# Patient Record
Sex: Male | Born: 1955
Health system: Southern US, Community
[De-identification: ages and names within clinical notes are randomized; demographics above are authoritative.]

## PROBLEM LIST (undated history)

## (undated) DIAGNOSIS — N183 Chronic kidney disease, stage 3 (moderate): Secondary | ICD-10-CM

## (undated) DIAGNOSIS — K219 Gastro-esophageal reflux disease without esophagitis: Secondary | ICD-10-CM

## (undated) DIAGNOSIS — I2510 Atherosclerotic heart disease of native coronary artery without angina pectoris: Secondary | ICD-10-CM

## (undated) DIAGNOSIS — E785 Hyperlipidemia, unspecified: Secondary | ICD-10-CM

## (undated) DIAGNOSIS — Z87442 Personal history of urinary calculi: Secondary | ICD-10-CM

## (undated) DIAGNOSIS — N200 Calculus of kidney: Secondary | ICD-10-CM

## (undated) DIAGNOSIS — E669 Obesity, unspecified: Secondary | ICD-10-CM

## (undated) HISTORY — PX: OTHER SURGICAL HISTORY: SHX169

## (undated) HISTORY — PX: POLYPECTOMY: SHX149

## (undated) HISTORY — DX: Atherosclerotic heart disease of native coronary artery without angina pectoris: I25.10

## (undated) HISTORY — DX: Gastro-esophageal reflux disease without esophagitis: K21.9

## (undated) HISTORY — PX: LITHOTRIPSY: SUR834

## (undated) HISTORY — DX: Calculus of kidney: N20.0

---

## 2000-10-17 ENCOUNTER — Encounter: Payer: Self-pay | Admitting: Family Medicine

## 2000-10-17 ENCOUNTER — Ambulatory Visit (HOSPITAL_COMMUNITY): Admission: RE | Admit: 2000-10-17 | Discharge: 2000-10-17 | Payer: Self-pay

## 2005-12-15 HISTORY — PX: COLONOSCOPY: SHX174

## 2006-01-20 ENCOUNTER — Ambulatory Visit: Payer: Self-pay | Admitting: Gastroenterology

## 2006-02-03 ENCOUNTER — Ambulatory Visit: Payer: Self-pay | Admitting: Gastroenterology

## 2011-03-07 ENCOUNTER — Encounter: Payer: Self-pay | Admitting: Gastroenterology

## 2011-03-13 NOTE — Letter (Signed)
Summary: Colonoscopy Letter  [facility]  [address] [place], [place] [address]   Phone: [phone]  Fax: [phone]      March 07, 2011 MRN: [id number]   [facility] [place] [address] [place], [place]  [address]   Dear Mr. Dunsmore,   According to your medical record, it is time for you to schedule a Colonoscopy. The American Cancer Society recommends this procedure as a method to detect early colon cancer. Patients with a family history of colon cancer, or a personal history of colon polyps or inflammatory bowel disease are at increased risk.  This letter has been generated based on the recommendations made at the time of your procedure. If you feel that in your particular situation this may no longer apply, please contact our office.  Please call our office at [phone] to schedule this appointment or to update your records at your earliest convenience.  Thank you for cooperating with [place] to provide you with the very best care possible.   Sincerely,  Barbette Hair. Arlyce Dice, M.D.  [facility] [phone]

## 2011-06-02 ENCOUNTER — Encounter: Payer: Self-pay | Admitting: Gastroenterology

## 2011-06-02 ENCOUNTER — Ambulatory Visit (AMBULATORY_SURGERY_CENTER): Payer: BC Managed Care – PPO | Admitting: *Deleted

## 2011-06-02 VITALS — Ht 68.0 in | Wt 200.1 lb

## 2011-06-02 DIAGNOSIS — Z86010 Personal history of colonic polyps: Secondary | ICD-10-CM

## 2011-06-02 MED ORDER — PEG-KCL-NACL-NASULF-NA ASC-C 100 G PO SOLR: ORAL | Status: DC

## 2011-06-16 ENCOUNTER — Ambulatory Visit (AMBULATORY_SURGERY_CENTER): Payer: BC Managed Care – PPO | Admitting: Gastroenterology

## 2011-06-16 ENCOUNTER — Encounter: Payer: Self-pay | Admitting: Gastroenterology

## 2011-06-16 VITALS — BP 145/85 | HR 57 | Temp 97.4°F | Resp 11 | Ht 68.0 in | Wt 200.0 lb

## 2011-06-16 DIAGNOSIS — Z86010 Personal history of colonic polyps: Secondary | ICD-10-CM

## 2011-06-16 DIAGNOSIS — D126 Benign neoplasm of colon, unspecified: Secondary | ICD-10-CM

## 2011-06-16 DIAGNOSIS — Z1211 Encounter for screening for malignant neoplasm of colon: Secondary | ICD-10-CM

## 2011-06-16 DIAGNOSIS — K5730 Diverticulosis of large intestine without perforation or abscess without bleeding: Secondary | ICD-10-CM

## 2011-06-16 MED ORDER — SODIUM CHLORIDE 0.9 % IV SOLN: 500.0000 mL | INTRAVENOUS | Status: DC

## 2011-06-16 NOTE — Progress Notes (Signed)
Abdominal pressure by tech and nurse to aid the scope advancement to reach the cecum.  MAW  Pt tolerated the procedure well. MAW

## 2011-06-16 NOTE — Patient Instructions (Addendum)
Diverticulosis Diverticulosis is a common condition that develops when small pouches (diverticula) form in the wall of the colon. The risk of diverticulosis increases with age. It happens more often in people who eat a low-fiber diet. Most individuals with diverticulosis have no symptoms. Those individuals with symptoms usually experience belly (abdominal) pain, constipation, or loose stools (diarrhea). HOME CARE INSTRUCTIONS  Increase the amount of fiber in your diet as directed by your caregiver or dietician. This may reduce symptoms of diverticulosis.   Your caregiver may recommend taking a dietary fiber supplement.   Drink at least 6 to 8 glasses of water each day to prevent constipation.   Try not to strain when you have a bowel movement.   Your caregiver may recommend avoiding nuts and seeds to prevent complications, although this is still an uncertain benefit.   Only take over-the-counter or prescription medicines for pain, discomfort, or fever as directed by your caregiver.  FOODS HAVING HIGH FIBER CONTENT INCLUDE:  Fruits. Apple, peach, pear, tangerine, raisins, prunes.   Vegetables. Brussels sprouts, asparagus, broccoli, cabbage, carrot, cauliflower, romaine lettuce, spinach, summer squash, tomato, winter squash, zucchini.   Starchy Vegetables. Baked beans, kidney beans, lima beans, split peas, lentils, potatoes (with skin).   Grains. Whole wheat bread, brown rice, bran flake cereal, plain oatmeal, white rice, shredded wheat, bran muffins.  SEEK IMMEDIATE MEDICAL CARE IF:  You develop increasing pain or severe bloating.   You have an oral temperature above 100, not controlled by medicine.   You develop vomiting or bowel movements that are bloody or black.  Document Released: 08/28/2004 Document Re-Released: 05/21/2010 [facility] Patient Information 2011 [place], [place]  .Polyps, Colon  A polyp is extra tissue that grows inside your body. Colon polyps grow in the large  intestine. The large intestine, also called the colon, is part of your digestive system. It is a long, hollow tube at the end of your digestive tract where your body makes and stores stool. Most polyps are not dangerous. They are benign. This means they are not cancerous. But over time, some types of polyps can turn into cancer. Polyps that are smaller than a pea are usually not harmful. But larger polyps could someday become or may already be cancerous. To be safe, doctors remove all polyps and test them.  WHO GETS POLYPS? Anyone can get polyps, but certain people are more likely than others. You may have a greater chance of getting polyps if:  You are over 50.   You have had polyps before.   Someone in your family has had polyps.   Someone in your family has had cancer of the large intestine.   Find out if someone in your family has had polyps. You may also be more likely to get polyps if you:   Eat a lot of fatty foods   Smoke   Drink alcohol   Do not exercise  Eat too much  SYMPTOMS Most small polyps do not cause symptoms. People often do not know they have one until their caregiver finds it during a regular checkup or while testing them for something else. Some people do have symptoms like these:  Bleeding from the anus. You might notice blood on your underwear or on toilet paper after you have had a bowel movement.   Constipation or diarrhea that lasts more than a week.   Blood in the stool. Blood can make stool look black or it can show up as red streaks in the stool.  If  you have any of these symptoms, see your caregiver. HOW DOES THE DOCTOR TEST FOR POLYPS? The doctor can use four tests to check for polyps:  Digital rectal exam. The caregiver wears gloves and checks your rectum (the last part of the large intestine) to see if it feels normal. This test would find polyps only in the rectum. Your caregiver may need to do one of the other tests listed below to find polyps  higher up in the intestine.   Barium enema. The caregiver puts a liquid called barium into your rectum before taking x-rays of your large intestine. Barium makes your intestine look white in the pictures. Polyps are dark, so they are easy to see.   Sigmoidoscopy. With this test, the caregiver can see inside your large intestine. A thin flexible tube is placed into your rectum. The device is called a sigmoidoscope, which has a light and a tiny video camera in it. The caregiver uses the sigmoidoscope to look at the last third of your large intestine.   Colonoscopy. This test is like sigmoidoscopy, but the caregiver looks at all of the large intestine. It usually requires sedation. This is the most common method for finding and removing polyps.  TREATMENT  The caregiver will remove the polyp during sigmoidoscopy or colonoscopy. The polyp is then tested for cancer.   If you have had polyps, your caregiver may want you to get tested regularly in the future.  PREVENTION There is not one sure way to prevent polyps. You might be able to lower your risk of getting them if you:  Eat more fruits and vegetables and less fatty food.   Do not smoke.   Avoid alcohol.   Exercise every day.   Lose weight if you are overweight.   Eating more calcium and folate can also lower your risk of getting polyps. Some foods that are rich in calcium are milk, cheese, and broccoli. Some foods that are rich in folate are chickpeas, kidney beans, and spinach.   Aspirin might help prevent polyps. Studies are under way.  Document Released: 08/27/2004 Document Re-Released: 05/21/2010 [facility] Patient Information 2011 [place], [place].  [employer] reviewed with patient and care partner.  [facility] Discharge Instructions signed by care partner.  Impressions:  Polyp (handout given)  Diverticulosis (handout given along with high fiber diet handout)  Resume medications as you were previously  taking prior to exam.

## 2011-06-17 ENCOUNTER — Telehealth: Payer: Self-pay

## 2011-06-17 NOTE — Telephone Encounter (Signed)

## 2014-01-25 ENCOUNTER — Ambulatory Visit: Payer: BC Managed Care – PPO | Admitting: Cardiology

## 2015-01-01 ENCOUNTER — Other Ambulatory Visit: Payer: Self-pay | Admitting: Family Medicine

## 2015-01-01 ENCOUNTER — Other Ambulatory Visit: Payer: Self-pay

## 2015-01-01 DIAGNOSIS — M5412 Radiculopathy, cervical region: Secondary | ICD-10-CM

## 2015-01-02 ENCOUNTER — Ambulatory Visit
Admission: RE | Admit: 2015-01-02 | Discharge: 2015-01-02 | Disposition: A | Discharge status: Home / Self Care | Payer: BLUE CROSS/BLUE SHIELD | Source: Ambulatory Visit | Attending: Family Medicine | Admitting: Family Medicine

## 2015-01-02 DIAGNOSIS — M5412 Radiculopathy, cervical region: Secondary | ICD-10-CM

## 2015-11-22 ENCOUNTER — Encounter: Payer: Self-pay | Admitting: Gastroenterology

## 2016-04-29 DIAGNOSIS — K429 Umbilical hernia without obstruction or gangrene: Secondary | ICD-10-CM | POA: Diagnosis not present

## 2016-04-29 DIAGNOSIS — R944 Abnormal results of kidney function studies: Secondary | ICD-10-CM | POA: Diagnosis not present

## 2016-05-05 DIAGNOSIS — K429 Umbilical hernia without obstruction or gangrene: Secondary | ICD-10-CM | POA: Diagnosis not present

## 2016-05-19 ENCOUNTER — Encounter: Payer: Self-pay | Admitting: Gastroenterology

## 2016-06-19 DIAGNOSIS — M9901 Segmental and somatic dysfunction of cervical region: Secondary | ICD-10-CM | POA: Diagnosis not present

## 2016-06-19 DIAGNOSIS — R51 Headache: Secondary | ICD-10-CM | POA: Diagnosis not present

## 2016-06-23 DIAGNOSIS — R51 Headache: Secondary | ICD-10-CM | POA: Diagnosis not present

## 2016-06-23 DIAGNOSIS — M9901 Segmental and somatic dysfunction of cervical region: Secondary | ICD-10-CM | POA: Diagnosis not present

## 2016-06-24 DIAGNOSIS — R51 Headache: Secondary | ICD-10-CM | POA: Diagnosis not present

## 2016-06-24 DIAGNOSIS — M9901 Segmental and somatic dysfunction of cervical region: Secondary | ICD-10-CM | POA: Diagnosis not present

## 2016-06-26 DIAGNOSIS — M9901 Segmental and somatic dysfunction of cervical region: Secondary | ICD-10-CM | POA: Diagnosis not present

## 2016-06-26 DIAGNOSIS — R51 Headache: Secondary | ICD-10-CM | POA: Diagnosis not present

## 2016-06-30 DIAGNOSIS — R51 Headache: Secondary | ICD-10-CM | POA: Diagnosis not present

## 2016-06-30 DIAGNOSIS — M9901 Segmental and somatic dysfunction of cervical region: Secondary | ICD-10-CM | POA: Diagnosis not present

## 2016-07-02 DIAGNOSIS — M9901 Segmental and somatic dysfunction of cervical region: Secondary | ICD-10-CM | POA: Diagnosis not present

## 2016-07-02 DIAGNOSIS — R51 Headache: Secondary | ICD-10-CM | POA: Diagnosis not present

## 2016-07-03 DIAGNOSIS — R51 Headache: Secondary | ICD-10-CM | POA: Diagnosis not present

## 2016-07-03 DIAGNOSIS — M9901 Segmental and somatic dysfunction of cervical region: Secondary | ICD-10-CM | POA: Diagnosis not present

## 2016-07-07 DIAGNOSIS — M9901 Segmental and somatic dysfunction of cervical region: Secondary | ICD-10-CM | POA: Diagnosis not present

## 2016-07-07 DIAGNOSIS — R51 Headache: Secondary | ICD-10-CM | POA: Diagnosis not present

## 2016-07-09 DIAGNOSIS — M9901 Segmental and somatic dysfunction of cervical region: Secondary | ICD-10-CM | POA: Diagnosis not present

## 2016-07-09 DIAGNOSIS — R51 Headache: Secondary | ICD-10-CM | POA: Diagnosis not present

## 2016-07-16 DIAGNOSIS — R51 Headache: Secondary | ICD-10-CM | POA: Diagnosis not present

## 2016-07-16 DIAGNOSIS — M9901 Segmental and somatic dysfunction of cervical region: Secondary | ICD-10-CM | POA: Diagnosis not present

## 2016-07-21 DIAGNOSIS — M9901 Segmental and somatic dysfunction of cervical region: Secondary | ICD-10-CM | POA: Diagnosis not present

## 2016-07-21 DIAGNOSIS — R51 Headache: Secondary | ICD-10-CM | POA: Diagnosis not present

## 2016-07-28 DIAGNOSIS — M9901 Segmental and somatic dysfunction of cervical region: Secondary | ICD-10-CM | POA: Diagnosis not present

## 2016-07-28 DIAGNOSIS — R51 Headache: Secondary | ICD-10-CM | POA: Diagnosis not present

## 2016-08-13 ENCOUNTER — Encounter: Payer: Self-pay | Admitting: Gastroenterology

## 2016-08-19 DIAGNOSIS — K080 Exfoliation of teeth due to systemic causes: Secondary | ICD-10-CM | POA: Diagnosis not present

## 2016-10-22 ENCOUNTER — Encounter: Payer: BLUE CROSS/BLUE SHIELD | Admitting: Gastroenterology

## 2016-11-20 DIAGNOSIS — L814 Other melanin hyperpigmentation: Secondary | ICD-10-CM | POA: Diagnosis not present

## 2016-11-20 DIAGNOSIS — D1800 Hemangioma unspecified site: Secondary | ICD-10-CM | POA: Diagnosis not present

## 2016-11-20 DIAGNOSIS — D225 Melanocytic nevi of trunk: Secondary | ICD-10-CM | POA: Diagnosis not present

## 2016-11-20 DIAGNOSIS — L821 Other seborrheic keratosis: Secondary | ICD-10-CM | POA: Diagnosis not present

## 2016-12-02 ENCOUNTER — Ambulatory Visit
Admission: RE | Admit: 2016-12-02 | Discharge: 2016-12-02 | Disposition: A | Discharge status: Home / Self Care | Payer: BLUE CROSS/BLUE SHIELD | Source: Ambulatory Visit | Attending: Physician Assistant | Admitting: Physician Assistant

## 2016-12-02 ENCOUNTER — Other Ambulatory Visit: Payer: Self-pay | Admitting: Physician Assistant

## 2016-12-02 DIAGNOSIS — N200 Calculus of kidney: Secondary | ICD-10-CM | POA: Diagnosis not present

## 2016-12-02 DIAGNOSIS — M545 Low back pain: Secondary | ICD-10-CM | POA: Diagnosis not present

## 2016-12-23 ENCOUNTER — Ambulatory Visit (AMBULATORY_SURGERY_CENTER): Payer: Self-pay | Admitting: *Deleted

## 2016-12-23 VITALS — Ht 68.0 in | Wt 202.4 lb

## 2016-12-23 DIAGNOSIS — Z860100 Personal history of colon polyps, unspecified: Secondary | ICD-10-CM

## 2016-12-23 MED ORDER — NA SULFATE-K SULFATE-MG SULF 17.5-3.13-1.6 GM/177ML PO SOLN: ORAL | 0 refills | Status: DC

## 2016-12-23 NOTE — Progress Notes (Signed)
No egg or soy allergy  Pt denies trouble moving neck or anesthesia problems  No home oxygen used or hx of sleep apnea  When I asked him about his family hx, he states, "Don't ask me about my family.  I don't know and I'm not close to them."  No diet medications taken

## 2016-12-29 ENCOUNTER — Encounter: Payer: Self-pay | Admitting: Gastroenterology

## 2016-12-29 ENCOUNTER — Telehealth: Payer: Self-pay | Admitting: Gastroenterology

## 2016-12-29 NOTE — Telephone Encounter (Signed)
Spoke with wife.  Reviewed instructions for Miralax prep.  She stated understanding and mentioned she is a neighbor to [employer] and plans to call her if she has more questions.  Advised her that Olivia Mackie is not here today.                                                                                                                                                                           Amylia Collazos/RN

## 2016-12-30 DIAGNOSIS — R300 Dysuria: Secondary | ICD-10-CM | POA: Diagnosis not present

## 2016-12-30 DIAGNOSIS — N410 Acute prostatitis: Secondary | ICD-10-CM | POA: Diagnosis not present

## 2017-01-06 ENCOUNTER — Encounter: Payer: BLUE CROSS/BLUE SHIELD | Admitting: Gastroenterology

## 2017-02-12 ENCOUNTER — Ambulatory Visit (AMBULATORY_SURGERY_CENTER): Payer: BLUE CROSS/BLUE SHIELD | Admitting: Gastroenterology

## 2017-02-12 ENCOUNTER — Encounter: Payer: Self-pay | Admitting: Gastroenterology

## 2017-02-12 VITALS — BP 120/75 | HR 52 | Temp 98.7°F | Resp 15 | Ht 68.0 in | Wt 202.0 lb

## 2017-02-12 DIAGNOSIS — D122 Benign neoplasm of ascending colon: Secondary | ICD-10-CM

## 2017-02-12 DIAGNOSIS — Z86010 Personal history of colonic polyps: Secondary | ICD-10-CM

## 2017-02-12 MED ORDER — SODIUM CHLORIDE 0.9 % IV SOLN: 500.0000 mL | INTRAVENOUS | Status: DC

## 2017-02-12 NOTE — Op Note (Signed)
[place] Patient Name: Manvir Camp Procedure Date: 02/12/2017 1:26 PM MRN: [id number] Endoscopist: Mallie Mussel L. Loletha Carrow , MD Age: 61 Referring MD:  Date of Birth: 1956/10/02 Gender: Male Account #: [id number] Procedure:                Colonoscopy Indications:              Surveillance: Personal history of adenomatous                            polyps on last colonoscopy 5 years ago (63mm TA 2012) Medicines:                Monitored Anesthesia Care Procedure:                Pre-Anesthesia Assessment:                           - Prior to the procedure, a History and Physical                            was performed, and patient medications and                            allergies were reviewed. The patient's tolerance of                            previous anesthesia was also reviewed. The risks                            and benefits of the procedure and the sedation                            options and risks were discussed with the patient.                            All questions were answered, and informed consent                            was obtained. Prior Anticoagulants: The patient has                            taken no previous anticoagulant or antiplatelet                            agents. ASA Grade Assessment: II - A patient with                            mild systemic disease. After reviewing the risks                            and benefits, the patient was deemed in                            satisfactory condition to undergo the procedure.  After obtaining informed consent, the colonoscope                            was passed under direct vision. Throughout the                            procedure, the patient's blood pressure, pulse, and                            oxygen saturations were monitored continuously. The                            Colonoscope was introduced through the anus and                            advanced to the  the cecum, identified by                            appendiceal orifice and ileocecal valve. The                            colonoscopy was performed without difficulty. The                            patient tolerated the procedure well. The quality                            of the bowel preparation was good. The ileocecal                            valve, appendiceal orifice, and rectum were                            photographed. The quality of the bowel preparation                            was evaluated using the BBPS [facility] Bowel                            Preparation Scale) with scores of: Right Colon = 2,                            Transverse Colon = 2 and Left Colon = 2. The total                            BBPS score equals 6. ,after lavage. The bowel                            preparation used was SUPREP. Scope In: 1:38:59 PM Scope Out: 1:54:50 PM Scope Withdrawal Time: 0 hours 10 minutes 50 seconds  Total Procedure Duration: 0 hours 15 minutes 51 seconds  Findings:                 The perianal and  digital rectal examinations were                            normal.                           A 8 mm polyp was found in the proximal ascending                            colon. The polyp was semi-sessile. The polyp was                            removed with a cold snare. Resection and retrieval                            were complete.                           A few small-mouthed diverticula were found in the                            left colon.                           The exam was otherwise without abnormality on                            direct and retroflexion views. Complications:            No immediate complications. Estimated Blood Loss:     Estimated blood loss: none. Impression:               - One 8 mm polyp in the proximal ascending colon,                            removed with a cold snare. Resected and retrieved.                           - Diverticulosis  in the left colon.                           - The examination was otherwise normal on direct                            and retroflexion views. Recommendation:           - Patient has a contact number available for                            emergencies. The signs and symptoms of potential                            delayed complications were discussed with the                            patient. Return to normal activities tomorrow.  Written discharge instructions were provided to the                            patient.                           - Resume previous diet.                           - Continue present medications.                           - Await pathology results.                           - Repeat colonoscopy is recommended for                            surveillance. The colonoscopy date will be                            determined after pathology results from today's                            exam become available for review. Henry L. Loletha Carrow, MD 02/12/2017 2:02:41 PM This report has been signed electronically.

## 2017-02-12 NOTE — Progress Notes (Signed)
Called to room to assist during endoscopic procedure.  Patient ID and intended procedure confirmed with present staff. Received instructions for my participation in the procedure from the performing physician.  

## 2017-02-12 NOTE — Progress Notes (Signed)
Report to PACU, RN, vss, BBS= Clear.  

## 2017-02-12 NOTE — Patient Instructions (Signed)
YOU HAD AN ENDOSCOPIC PROCEDURE TODAY AT THE [facility]:   Refer to the procedure report that was given to you for any specific questions about what was found during the examination.  If the procedure report does not answer your questions, please call your gastroenterologist to clarify.  If you requested that your care partner not be given the details of your procedure findings, then the procedure report has been included in a sealed envelope for you to review at your convenience later.  YOU SHOULD EXPECT: Some feelings of bloating in the abdomen. Passage of more gas than usual.  Walking can help get rid of the air that was put into your GI tract during the procedure and reduce the bloating. If you had a lower endoscopy (such as a colonoscopy or flexible sigmoidoscopy) you may notice spotting of blood in your stool or on the toilet paper. If you underwent a bowel prep for your procedure, you may not have a normal bowel movement for a few days.  Please Note:  You might notice some irritation and congestion in your nose or some drainage.  This is from the oxygen used during your procedure.  There is no need for concern and it should clear up in a day or so.  SYMPTOMS TO REPORT IMMEDIATELY:   Following lower endoscopy (colonoscopy or flexible sigmoidoscopy):  Excessive amounts of blood in the stool  Significant tenderness or worsening of abdominal pains  Swelling of the abdomen that is new, acute  Fever of 100F or higher    For urgent or emergent issues, a gastroenterologist can be reached at any hour by calling [phone].   DIET:  We do recommend a small meal at first, but then you may proceed to your regular diet.  Drink plenty of fluids but you should avoid alcoholic beverages for 24 hours.  ACTIVITY:  You should plan to take it easy for the rest of today and you should NOT DRIVE or use heavy machinery until tomorrow (because of the sedation medicines used during the test).     FOLLOW UP: Our staff will call the number listed on your records the next business day following your procedure to check on you and address any questions or concerns that you may have regarding the information given to you following your procedure. If we do not reach you, we will leave a message.  However, if you are feeling well and you are not experiencing any problems, there is no need to return our call.  We will assume that you have returned to your regular daily activities without incident.  If any biopsies were taken you will be contacted by phone or by letter within the next 1-3 weeks.  Please call us at [phone] if you have not heard about the biopsies in 3 weeks.    SIGNATURES/CONFIDENTIALITY: You and/or your care partner have signed paperwork which will be entered into your electronic medical record.  These signatures attest to the fact that that the information above on your After Visit Summary has been reviewed and is understood.  Full responsibility of the confidentiality of this discharge information lies with you and/or your care-partner.   INFORMATION ON POLYPS  AND DIVERTICULOSIS GIVEN TO YOU TODAY 

## 2017-02-13 ENCOUNTER — Telehealth: Payer: Self-pay | Admitting: *Deleted

## 2017-02-13 NOTE — Telephone Encounter (Signed)
  Follow up Call-  Call back number 02/12/2017  Post procedure Call Back phone  # [phone]  Permission to leave phone message Yes  Some recent data might be hidden     Patient questions:  Do you have a fever, pain , or abdominal swelling? No. Pain Score  0 *  Have you tolerated food without any problems? Yes.    Have you been able to return to your normal activities? Yes.    Do you have any questions about your discharge instructions: Diet   No. Medications  No. Follow up visit  No.  Do you have questions or concerns about your Care? No.  Actions: * If pain score is 4 or above: No action needed, pain <4.

## 2017-02-13 NOTE — Telephone Encounter (Signed)
  Follow up Call-  Call back number 02/12/2017  Post procedure Call Back phone  # [phone]  Permission to leave phone message Yes  Some recent data might be hidden    [facility]

## 2017-02-17 ENCOUNTER — Encounter: Payer: Self-pay | Admitting: Gastroenterology

## 2017-02-17 DIAGNOSIS — M25561 Pain in right knee: Secondary | ICD-10-CM | POA: Diagnosis not present

## 2017-03-11 DIAGNOSIS — E7800 Pure hypercholesterolemia, unspecified: Secondary | ICD-10-CM | POA: Diagnosis not present

## 2017-03-11 DIAGNOSIS — Z0000 Encounter for general adult medical examination without abnormal findings: Secondary | ICD-10-CM | POA: Diagnosis not present

## 2017-03-11 DIAGNOSIS — Z125 Encounter for screening for malignant neoplasm of prostate: Secondary | ICD-10-CM | POA: Diagnosis not present

## 2017-03-11 DIAGNOSIS — Z23 Encounter for immunization: Secondary | ICD-10-CM | POA: Diagnosis not present

## 2017-03-11 DIAGNOSIS — N183 Chronic kidney disease, stage 3 (moderate): Secondary | ICD-10-CM | POA: Diagnosis not present

## 2017-09-10 DIAGNOSIS — N183 Chronic kidney disease, stage 3 (moderate): Secondary | ICD-10-CM | POA: Diagnosis not present

## 2017-09-10 DIAGNOSIS — E785 Hyperlipidemia, unspecified: Secondary | ICD-10-CM | POA: Diagnosis not present

## 2017-10-05 DIAGNOSIS — K439 Ventral hernia without obstruction or gangrene: Secondary | ICD-10-CM | POA: Diagnosis not present

## 2017-10-05 DIAGNOSIS — K420 Umbilical hernia with obstruction, without gangrene: Secondary | ICD-10-CM | POA: Diagnosis not present

## 2017-10-15 DIAGNOSIS — M6208 Separation of muscle (nontraumatic), other site: Secondary | ICD-10-CM | POA: Diagnosis not present

## 2017-11-25 DIAGNOSIS — L814 Other melanin hyperpigmentation: Secondary | ICD-10-CM | POA: Diagnosis not present

## 2017-11-25 DIAGNOSIS — D1800 Hemangioma unspecified site: Secondary | ICD-10-CM | POA: Diagnosis not present

## 2017-11-25 DIAGNOSIS — D225 Melanocytic nevi of trunk: Secondary | ICD-10-CM | POA: Diagnosis not present

## 2017-11-25 DIAGNOSIS — L821 Other seborrheic keratosis: Secondary | ICD-10-CM | POA: Diagnosis not present

## 2018-02-12 ENCOUNTER — Telehealth: Payer: Self-pay

## 2018-02-12 NOTE — Telephone Encounter (Signed)
SENT REFERRAL TO SCHEDULING 

## 2018-02-15 ENCOUNTER — Other Ambulatory Visit: Payer: Self-pay

## 2018-02-15 ENCOUNTER — Encounter: Payer: Self-pay | Admitting: Cardiology

## 2018-02-15 ENCOUNTER — Inpatient Hospital Stay (HOSPITAL_COMMUNITY)
Admission: AD | Admit: 2018-02-15 | Discharge: 2018-02-21 | DRG: 234 | Disposition: A | Discharge status: Home / Self Care | Payer: BLUE CROSS/BLUE SHIELD | Source: Ambulatory Visit | Attending: Cardiothoracic Surgery | Admitting: Cardiothoracic Surgery

## 2018-02-15 ENCOUNTER — Observation Stay (HOSPITAL_COMMUNITY): Payer: BLUE CROSS/BLUE SHIELD

## 2018-02-15 DIAGNOSIS — I129 Hypertensive chronic kidney disease with stage 1 through stage 4 chronic kidney disease, or unspecified chronic kidney disease: Secondary | ICD-10-CM | POA: Diagnosis present

## 2018-02-15 DIAGNOSIS — N183 Chronic kidney disease, stage 3 unspecified: Secondary | ICD-10-CM | POA: Diagnosis present

## 2018-02-15 DIAGNOSIS — I200 Unstable angina: Secondary | ICD-10-CM | POA: Diagnosis not present

## 2018-02-15 DIAGNOSIS — Z87442 Personal history of urinary calculi: Secondary | ICD-10-CM | POA: Diagnosis not present

## 2018-02-15 DIAGNOSIS — Z8249 Family history of ischemic heart disease and other diseases of the circulatory system: Secondary | ICD-10-CM | POA: Diagnosis not present

## 2018-02-15 DIAGNOSIS — Z841 Family history of disorders of kidney and ureter: Secondary | ICD-10-CM | POA: Diagnosis not present

## 2018-02-15 DIAGNOSIS — Z01810 Encounter for preprocedural cardiovascular examination: Secondary | ICD-10-CM | POA: Diagnosis not present

## 2018-02-15 DIAGNOSIS — E669 Obesity, unspecified: Secondary | ICD-10-CM

## 2018-02-15 DIAGNOSIS — Z972 Presence of dental prosthetic device (complete) (partial): Secondary | ICD-10-CM

## 2018-02-15 DIAGNOSIS — N200 Calculus of kidney: Secondary | ICD-10-CM | POA: Diagnosis present

## 2018-02-15 DIAGNOSIS — Z09 Encounter for follow-up examination after completed treatment for conditions other than malignant neoplasm: Secondary | ICD-10-CM

## 2018-02-15 DIAGNOSIS — R9431 Abnormal electrocardiogram [ECG] [EKG]: Secondary | ICD-10-CM | POA: Diagnosis not present

## 2018-02-15 DIAGNOSIS — I2510 Atherosclerotic heart disease of native coronary artery without angina pectoris: Secondary | ICD-10-CM | POA: Diagnosis present

## 2018-02-15 DIAGNOSIS — Z951 Presence of aortocoronary bypass graft: Secondary | ICD-10-CM

## 2018-02-15 DIAGNOSIS — E8881 Metabolic syndrome: Secondary | ICD-10-CM | POA: Diagnosis present

## 2018-02-15 DIAGNOSIS — I10 Essential (primary) hypertension: Secondary | ICD-10-CM | POA: Diagnosis not present

## 2018-02-15 DIAGNOSIS — Z6829 Body mass index (BMI) 29.0-29.9, adult: Secondary | ICD-10-CM | POA: Diagnosis not present

## 2018-02-15 DIAGNOSIS — R110 Nausea: Secondary | ICD-10-CM | POA: Diagnosis not present

## 2018-02-15 DIAGNOSIS — J9811 Atelectasis: Secondary | ICD-10-CM | POA: Diagnosis not present

## 2018-02-15 DIAGNOSIS — I25110 Atherosclerotic heart disease of native coronary artery with unstable angina pectoris: Secondary | ICD-10-CM | POA: Diagnosis not present

## 2018-02-15 DIAGNOSIS — E785 Hyperlipidemia, unspecified: Secondary | ICD-10-CM

## 2018-02-15 DIAGNOSIS — I209 Angina pectoris, unspecified: Secondary | ICD-10-CM | POA: Diagnosis not present

## 2018-02-15 HISTORY — DX: Hyperlipidemia, unspecified: E78.5

## 2018-02-15 HISTORY — DX: Unstable angina: I20.0

## 2018-02-15 HISTORY — DX: Chronic kidney disease, stage 3 unspecified: N18.30

## 2018-02-15 HISTORY — DX: Obesity, unspecified: E66.9

## 2018-02-15 HISTORY — DX: Chronic kidney disease, stage 3 (moderate): N18.3

## 2018-02-15 LAB — COMPREHENSIVE METABOLIC PANEL
ALT: 22 U/L (ref 17–63)
AST: 24 U/L (ref 15–41)
Albumin: 4 g/dL (ref 3.5–5.0)
Alkaline Phosphatase: 63 U/L (ref 38–126)
Anion gap: 12 (ref 5–15)
BUN: 15 mg/dL (ref 6–20)
CHLORIDE: 103 mmol/L (ref 101–111)
CO2: 26 mmol/L (ref 22–32)
CREATININE: 1.48 mg/dL — AB (ref 0.61–1.24)
Calcium: 9 mg/dL (ref 8.9–10.3)
GFR calc non Af Amer: 49 mL/min — ABNORMAL LOW (ref >60)
GFR, EST AFRICAN AMERICAN: 57 mL/min — AB (ref >60)
Glucose, Bld: 120 mg/dL — ABNORMAL HIGH (ref 65–99)
POTASSIUM: 3.7 mmol/L (ref 3.5–5.1)
SODIUM: 141 mmol/L (ref 135–145)
Total Bilirubin: 1.1 mg/dL (ref 0.3–1.2)
Total Protein: 6.8 g/dL (ref 6.5–8.1)

## 2018-02-15 LAB — CBC WITH DIFFERENTIAL/PLATELET
BASOS ABS: 0 10*3/uL (ref 0.0–0.1)
Basophils Relative: 0 %
Eosinophils Absolute: 0.3 10*3/uL (ref 0.0–0.7)
Eosinophils Relative: 3 %
HEMATOCRIT: 41.7 % (ref 39.0–52.0)
HEMOGLOBIN: 14.4 g/dL (ref 13.0–17.0)
LYMPHS PCT: 27 %
Lymphs Abs: 2 10*3/uL (ref 0.7–4.0)
MCH: 31.1 pg (ref 26.0–34.0)
MCHC: 34.5 g/dL (ref 30.0–36.0)
MCV: 90.1 fL (ref 78.0–100.0)
MONO ABS: 0.4 10*3/uL (ref 0.1–1.0)
Monocytes Relative: 6 %
NEUTROS ABS: 4.7 10*3/uL (ref 1.7–7.7)
NEUTROS PCT: 64 %
Platelets: 199 10*3/uL (ref 150–400)
RBC: 4.63 MIL/uL (ref 4.22–5.81)
RDW: 12.6 % (ref 11.5–15.5)
WBC: 7.4 10*3/uL (ref 4.0–10.5)

## 2018-02-15 LAB — LIPID PANEL
CHOL/HDL RATIO: 5.2 ratio
Cholesterol: 239 mg/dL — ABNORMAL HIGH (ref 0–200)
HDL: 46 mg/dL (ref >40)
LDL CALC: 143 mg/dL — AB (ref 0–99)
TRIGLYCERIDES: 249 mg/dL — AB (ref <150)
VLDL: 50 mg/dL — AB (ref 0–40)

## 2018-02-15 LAB — TSH: TSH: 1.188 u[IU]/mL (ref 0.350–4.500)

## 2018-02-15 LAB — PROTIME-INR
INR: 0.95
PROTHROMBIN TIME: 12.6 s (ref 11.4–15.2)

## 2018-02-15 LAB — TROPONIN I

## 2018-02-15 LAB — APTT: aPTT: 28 seconds (ref 24–36)

## 2018-02-15 MED ORDER — NITROGLYCERIN 0.4 MG SL SUBL: 0.4000 mg | SUBLINGUAL_TABLET | SUBLINGUAL | Status: DC | PRN

## 2018-02-15 MED ORDER — ACETAMINOPHEN 325 MG PO TABS
650.0000 mg | ORAL_TABLET | ORAL | Status: DC | PRN
  Administered 2018-02-16: 650 mg via ORAL
  Filled 2018-02-15: qty 2

## 2018-02-15 MED ORDER — SODIUM CHLORIDE 0.9 % WEIGHT BASED INFUSION
3.0000 mL/kg/h | INTRAVENOUS | Status: DC
  Administered 2018-02-16: 3 mL/kg/h via INTRAVENOUS

## 2018-02-15 MED ORDER — ATORVASTATIN CALCIUM 40 MG PO TABS
40.0000 mg | ORAL_TABLET | Freq: Every day | ORAL | Status: DC
  Administered 2018-02-15 – 2018-02-20 (×5): 40 mg via ORAL
  Filled 2018-02-15 (×5): qty 1

## 2018-02-15 MED ORDER — ASPIRIN 300 MG RE SUPP
300.0000 mg | RECTAL | Status: AC
  Filled 2018-02-15: qty 1

## 2018-02-15 MED ORDER — ASPIRIN 81 MG PO CHEW
324.0000 mg | CHEWABLE_TABLET | ORAL | Status: AC
  Administered 2018-02-15: 324 mg via ORAL
  Filled 2018-02-15: qty 4

## 2018-02-15 MED ORDER — SODIUM CHLORIDE 0.9% FLUSH
3.0000 mL | Freq: Two times a day (BID) | INTRAVENOUS | Status: DC
  Administered 2018-02-15: 3 mL via INTRAVENOUS

## 2018-02-15 MED ORDER — METOPROLOL TARTRATE 25 MG PO TABS
25.0000 mg | ORAL_TABLET | Freq: Two times a day (BID) | ORAL | Status: DC
  Administered 2018-02-15 – 2018-02-16 (×3): 25 mg via ORAL
  Filled 2018-02-15 (×3): qty 1

## 2018-02-15 MED ORDER — ASPIRIN 81 MG PO CHEW
81.0000 mg | CHEWABLE_TABLET | ORAL | Status: AC
  Administered 2018-02-16: 81 mg via ORAL
  Filled 2018-02-15: qty 1

## 2018-02-15 MED ORDER — ENOXAPARIN SODIUM 40 MG/0.4ML ~~LOC~~ SOLN
40.0000 mg | SUBCUTANEOUS | Status: DC
  Administered 2018-02-15: 40 mg via SUBCUTANEOUS
  Filled 2018-02-15: qty 0.4

## 2018-02-15 MED ORDER — ASPIRIN EC 81 MG PO TBEC: 81.0000 mg | DELAYED_RELEASE_TABLET | Freq: Every day | ORAL | Status: DC

## 2018-02-15 MED ORDER — SODIUM CHLORIDE 0.9 % WEIGHT BASED INFUSION: 1.0000 mL/kg/h | INTRAVENOUS | Status: DC

## 2018-02-15 MED ORDER — SODIUM CHLORIDE 0.9% FLUSH: 3.0000 mL | INTRAVENOUS | Status: DC | PRN

## 2018-02-15 MED ORDER — SODIUM CHLORIDE 0.9 % IV SOLN: 250.0000 mL | INTRAVENOUS | Status: DC | PRN

## 2018-02-15 MED ORDER — ONDANSETRON HCL 4 MG/2ML IJ SOLN: 4.0000 mg | Freq: Four times a day (QID) | INTRAMUSCULAR | Status: DC | PRN

## 2018-02-15 NOTE — Plan of Care (Signed)
Pt. Understands to call for assistance when needed. Call light within reach.

## 2018-02-15 NOTE — Progress Notes (Signed)
EKG completed

## 2018-02-15 NOTE — Telephone Encounter (Signed)
REFERRAL FROM SR Shirline Frees [facility] # [phone]

## 2018-02-15 NOTE — H&P (Addendum)
History and Physical   Admit date: 02/15/2018 Name:  Todd Buck Medical record number: [id number] DOB/Age:  Aug 21, 1956  62 y.o. male  Referring Physician:   Dr. Samara Snide  Primary Cardiologist: Wynonia Lawman  Primary Physician:   Dr. Samara Snide  Chief complaint/reason for admission: chest pain  HPI:  This very nice 62 year old male has previously been healthy.  For about 1 month he has had exertional burning substernal chest discomfort with activity.  The discomfort will be worse if he eats a meal and then goes out and does activity.  The pain has begun to occur with increase in frequency and severity and at lower levels of exercise.  His last chest discomfort was yesterday walking out of [place].  He saw a physician at his primary doctor's office last week and was thought to have reflux and placed on a PPI.  He has continued to have chest discomfort since then.  There is some family history of cardiac disease in several relatives.  He has a prior history of some mild anterior T wave inversions in 2012 and had a negative myocardial perfusion scan but reported that he was not able to get his pulse rate up.  The most recent EKG showed significant T wave inversions in the inferior and the anterior leads.  When seen in the office today was recommended that he be admitted to the hospital particularly in light of a prior history of chronic kidney disease in order to undergo expedited catheterization.  He does have a history of taking frequent over-the-counter nonsteroidal anti-inflammatory agents and has abnormal kidney function.  He is reported to have stage III chronic kidney disease.  He denies PND, orthopnea or claudication.    Past Medical History:  Diagnosis Date  . Hyperlipidemia 02/15/2018  . Kidney disease, chronic, stage III (GFR 30-59 ml/min) ([place]) 02/15/2018  . Kidney stones   . Obesity (BMI 30-39.9) 02/15/2018     Past Surgical History:  Procedure Laterality Date  . COLONOSCOPY  2007  .  dental implants    . LASIK     EYES  . LITHOTRIPSY    . POLYPECTOMY     Allergies: has No Known Allergies.   Medications: Prior to Admission medications   Not on File   Family History:  Family Status  Relation Name Status  . Mother  Deceased  . Father  Alive  . Sister  Alive  . Brother  Alive  . Sister  Alive  . Sister  Alive  . Sister  Alive  . Sister  Alive  . Neg Hx  (Not Specified)   Social History:   reports that he has quit smoking. he has never used smokeless tobacco. He reports that he does not drink alcohol or use drugs.   Works as a Financial controller.  Has been married for a number of years.  Has 2 children.   Review of Systems: Occasional pain in his neck for which she sees a chiropractor, occasional headaches, he has a history of kidney stones but none recently. Other than as noted above, the remainder of the review of systems is normal  Physical Exam: Vitals: Blood pressure 130/90 sitting left arm, 130/88 standing left arm weight 200 height 68 BMI 30.41 pulse 68 General appearance: Pleasant mildly obese male in no acute distress Head: Normocephalic, without obvious abnormality, atraumatic Eyes: conjunctivae/corneas clear. PERRL, EOM's intact. Fundi not examined Neck: no adenopathy, no carotid bruit, no JVD and supple, symmetrical, trachea midline Lungs:  clear to auscultation bilaterally Heart: regular rate and rhythm, S1, S2 normal, no murmur, click, rub or gallop Abdomen: soft, non-tender; bowel sounds normal; no masses,  no organomegaly Rectal: deferred Extremities: extremities normal, atraumatic, no cyanosis or edema Pulses: 2+ and symmetric Skin: Skin color, texture, turgor normal. No rashes or lesions Neurologic: Grossly normal Psych: Alert and oriented x3  Labs: Pending at the time of dication  EKG: Sinus rhythm, anterolateral and interior T wave changes c/w ischemia Independently reviewed by me  Radiology: Pending    IMPRESSIONS: 1.  Chest pain suggestive of unstable angina 2.  Hyperlipidemia untreated 3.  Obesity 4.  Stage III chronic kidney disease  5.  Kidney stones  PLAN: The patient has a suggestive history of progressive chest discomfort with exertion.  He has a high risk EKG.  He also has chronic kidney disease by history.  With unstable angina we will go directly to catheterization.  Plan echocardiogram to evaluate LV function.  Hydrate prior to catheterization and plan catheterization in the morning. Cardiac catheterization was discussed with the patient fully including risks of myocardial infarction, death, stroke, bleeding, arrhythmia, dye allergy, renal insufficiency or bleeding.  The patient understands and is willing to proceed.  Possibility of intervention at the same time also discussed with patient and wife and they understand and are agreeable to proceed  Signed: W. Doristine Church MD [facility]  02/15/2018, 4:11 PM

## 2018-02-16 ENCOUNTER — Other Ambulatory Visit: Payer: Self-pay | Admitting: *Deleted

## 2018-02-16 ENCOUNTER — Encounter (HOSPITAL_COMMUNITY)
Admission: AD | Disposition: A | Discharge status: Home / Self Care | Payer: Self-pay | Source: Ambulatory Visit | Attending: Cardiothoracic Surgery

## 2018-02-16 ENCOUNTER — Observation Stay (HOSPITAL_COMMUNITY): Payer: BLUE CROSS/BLUE SHIELD

## 2018-02-16 ENCOUNTER — Encounter (HOSPITAL_COMMUNITY): Payer: Self-pay | Admitting: Cardiology

## 2018-02-16 ENCOUNTER — Ambulatory Visit (HOSPITAL_COMMUNITY): Admission: RE | Admit: 2018-02-16 | Payer: Self-pay | Source: Ambulatory Visit | Admitting: Cardiology

## 2018-02-16 ENCOUNTER — Other Ambulatory Visit (HOSPITAL_COMMUNITY): Payer: Self-pay

## 2018-02-16 ENCOUNTER — Other Ambulatory Visit: Payer: Self-pay

## 2018-02-16 DIAGNOSIS — I2510 Atherosclerotic heart disease of native coronary artery without angina pectoris: Secondary | ICD-10-CM | POA: Diagnosis present

## 2018-02-16 DIAGNOSIS — Z01810 Encounter for preprocedural cardiovascular examination: Secondary | ICD-10-CM | POA: Diagnosis not present

## 2018-02-16 DIAGNOSIS — E785 Hyperlipidemia, unspecified: Secondary | ICD-10-CM | POA: Diagnosis not present

## 2018-02-16 DIAGNOSIS — Z87442 Personal history of urinary calculi: Secondary | ICD-10-CM | POA: Diagnosis not present

## 2018-02-16 DIAGNOSIS — Z841 Family history of disorders of kidney and ureter: Secondary | ICD-10-CM | POA: Diagnosis not present

## 2018-02-16 DIAGNOSIS — R110 Nausea: Secondary | ICD-10-CM | POA: Diagnosis not present

## 2018-02-16 DIAGNOSIS — I25110 Atherosclerotic heart disease of native coronary artery with unstable angina pectoris: Secondary | ICD-10-CM | POA: Diagnosis not present

## 2018-02-16 DIAGNOSIS — I10 Essential (primary) hypertension: Secondary | ICD-10-CM | POA: Diagnosis not present

## 2018-02-16 DIAGNOSIS — N183 Chronic kidney disease, stage 3 (moderate): Secondary | ICD-10-CM | POA: Diagnosis not present

## 2018-02-16 DIAGNOSIS — I200 Unstable angina: Secondary | ICD-10-CM | POA: Diagnosis not present

## 2018-02-16 DIAGNOSIS — Z6829 Body mass index (BMI) 29.0-29.9, adult: Secondary | ICD-10-CM | POA: Diagnosis not present

## 2018-02-16 DIAGNOSIS — Z8249 Family history of ischemic heart disease and other diseases of the circulatory system: Secondary | ICD-10-CM | POA: Diagnosis not present

## 2018-02-16 DIAGNOSIS — N200 Calculus of kidney: Secondary | ICD-10-CM | POA: Diagnosis present

## 2018-02-16 DIAGNOSIS — E669 Obesity, unspecified: Secondary | ICD-10-CM | POA: Diagnosis present

## 2018-02-16 DIAGNOSIS — I129 Hypertensive chronic kidney disease with stage 1 through stage 4 chronic kidney disease, or unspecified chronic kidney disease: Secondary | ICD-10-CM | POA: Diagnosis not present

## 2018-02-16 DIAGNOSIS — E8881 Metabolic syndrome: Secondary | ICD-10-CM | POA: Diagnosis present

## 2018-02-16 DIAGNOSIS — Z972 Presence of dental prosthetic device (complete) (partial): Secondary | ICD-10-CM | POA: Diagnosis not present

## 2018-02-16 DIAGNOSIS — J9811 Atelectasis: Secondary | ICD-10-CM | POA: Diagnosis not present

## 2018-02-16 HISTORY — DX: Atherosclerotic heart disease of native coronary artery without angina pectoris: I25.10

## 2018-02-16 HISTORY — PX: LEFT HEART CATH AND CORONARY ANGIOGRAPHY: CATH118249

## 2018-02-16 LAB — PULMONARY FUNCTION TEST
FEF 25-75 Post: 2.94 L/sec
FEF 25-75 Pre: 2.7 L/sec
FEF2575-%Change-Post: 8 %
FEF2575-%Pred-Post: 108 %
FEF2575-%Pred-Pre: 99 %
FEV1-%Change-Post: 3 %
FEV1-%Pred-Post: 96 %
FEV1-%Pred-Pre: 93 %
FEV1-Post: 3.18 L
FEV1-Pre: 3.07 L
FEV1FVC-%Change-Post: 2 %
FEV1FVC-%Pred-Pre: 101 %
FEV6-%Change-Post: 2 %
FEV6-%Pred-Post: 98 %
FEV6-%Pred-Pre: 95 %
FEV6-Post: 4.09 L
FEV6-Pre: 3.98 L
FEV6FVC-%Change-Post: 1 %
FEV6FVC-%Pred-Post: 105 %
FEV6FVC-%Pred-Pre: 103 %
FVC-%Change-Post: 1 %
FVC-%Pred-Post: 93 %
FVC-%Pred-Pre: 91 %
FVC-Post: 4.09 L
FVC-Pre: 4.03 L
Post FEV1/FVC ratio: 78 %
Post FEV6/FVC ratio: 100 %
Pre FEV1/FVC ratio: 76 %
Pre FEV6/FVC Ratio: 99 %

## 2018-02-16 LAB — URINALYSIS, ROUTINE W REFLEX MICROSCOPIC
Bacteria, UA: NONE SEEN
Bilirubin Urine: NEGATIVE
Glucose, UA: NEGATIVE mg/dL
Hgb urine dipstick: NEGATIVE
Ketones, ur: NEGATIVE mg/dL
Nitrite: NEGATIVE
Protein, ur: NEGATIVE mg/dL
Specific Gravity, Urine: 1.01 (ref 1.005–1.030)
Squamous Epithelial / LPF: NONE SEEN
pH: 6 (ref 5.0–8.0)

## 2018-02-16 LAB — COMPREHENSIVE METABOLIC PANEL
ALT: 19 U/L (ref 17–63)
AST: 20 U/L (ref 15–41)
Albumin: 3.9 g/dL (ref 3.5–5.0)
Alkaline Phosphatase: 62 U/L (ref 38–126)
Anion gap: 10 (ref 5–15)
BUN: 19 mg/dL (ref 6–20)
CO2: 27 mmol/L (ref 22–32)
Calcium: 9.7 mg/dL (ref 8.9–10.3)
Chloride: 105 mmol/L (ref 101–111)
Creatinine, Ser: 1.54 mg/dL — ABNORMAL HIGH (ref 0.61–1.24)
GFR calc Af Amer: 54 mL/min — ABNORMAL LOW (ref >60)
GFR calc non Af Amer: 47 mL/min — ABNORMAL LOW (ref >60)
Glucose, Bld: 109 mg/dL — ABNORMAL HIGH (ref 65–99)
Potassium: 3.8 mmol/L (ref 3.5–5.1)
Sodium: 142 mmol/L (ref 135–145)
Total Bilirubin: 1 mg/dL (ref 0.3–1.2)
Total Protein: 6.8 g/dL (ref 6.5–8.1)

## 2018-02-16 LAB — HEMOGLOBIN A1C
HEMOGLOBIN A1C: 5.5 % (ref 4.8–5.6)
Hgb A1c MFr Bld: 5.5 % (ref 4.8–5.6)
MEAN PLASMA GLUCOSE: 111.15 mg/dL
Mean Plasma Glucose: 111.15 mg/dL

## 2018-02-16 LAB — TYPE AND SCREEN
ABO/RH(D): O NEG
ANTIBODY SCREEN: NEGATIVE

## 2018-02-16 LAB — PROTIME-INR
INR: 1.04
Prothrombin Time: 13.5 seconds (ref 11.4–15.2)

## 2018-02-16 LAB — ABO/RH: ABO/RH(D): O NEG

## 2018-02-16 LAB — APTT: aPTT: 37 seconds — ABNORMAL HIGH (ref 24–36)

## 2018-02-16 LAB — HEPARIN LEVEL (UNFRACTIONATED): Heparin Unfractionated: 0.27 IU/mL — ABNORMAL LOW (ref 0.30–0.70)

## 2018-02-16 SURGERY — LEFT HEART CATH AND CORONARY ANGIOGRAPHY
Anesthesia: LOCAL

## 2018-02-16 MED ORDER — NITROGLYCERIN 1 MG/10 ML FOR IR/CATH LAB
INTRA_ARTERIAL | Status: DC | PRN
  Administered 2018-02-16: 200 ug via INTRACORONARY

## 2018-02-16 MED ORDER — FENTANYL CITRATE (PF) 100 MCG/2ML IJ SOLN
INTRAMUSCULAR | Status: DC | PRN
  Administered 2018-02-16: 50 ug via INTRAVENOUS

## 2018-02-16 MED ORDER — CHLORHEXIDINE GLUCONATE CLOTH 2 % EX PADS
6.0000 | MEDICATED_PAD | Freq: Once | CUTANEOUS | Status: AC
  Administered 2018-02-16: 6 via TOPICAL

## 2018-02-16 MED ORDER — MILRINONE LACTATE IN DEXTROSE 20-5 MG/100ML-% IV SOLN
0.1250 ug/kg/min | INTRAVENOUS | Status: DC
  Filled 2018-02-16: qty 100

## 2018-02-16 MED ORDER — HEPARIN SODIUM (PORCINE) 1000 UNIT/ML IJ SOLN
INTRAMUSCULAR | Status: AC
  Filled 2018-02-16: qty 1

## 2018-02-16 MED ORDER — NITROGLYCERIN IN D5W 200-5 MCG/ML-% IV SOLN
2.0000 ug/min | INTRAVENOUS | Status: DC
  Filled 2018-02-16: qty 250

## 2018-02-16 MED ORDER — DEXMEDETOMIDINE HCL IN NACL 400 MCG/100ML IV SOLN
0.1000 ug/kg/h | INTRAVENOUS | Status: AC
  Administered 2018-02-17: .5 ug/kg/h via INTRAVENOUS
  Filled 2018-02-16: qty 100

## 2018-02-16 MED ORDER — SODIUM CHLORIDE 0.9 % IV SOLN
INTRAVENOUS | Status: AC
  Administered 2018-02-17 (×2): 1.1 [IU]/h via INTRAVENOUS
  Filled 2018-02-16: qty 1

## 2018-02-16 MED ORDER — ONDANSETRON HCL 4 MG/2ML IJ SOLN
INTRAMUSCULAR | Status: AC
  Filled 2018-02-16: qty 2

## 2018-02-16 MED ORDER — PANTOPRAZOLE SODIUM 40 MG PO TBEC
40.0000 mg | DELAYED_RELEASE_TABLET | Freq: Every day | ORAL | Status: DC
  Administered 2018-02-16: 40 mg via ORAL
  Filled 2018-02-16: qty 1

## 2018-02-16 MED ORDER — SODIUM CHLORIDE 0.9 % IV SOLN
250.0000 mL | INTRAVENOUS | Status: DC | PRN
  Administered 2018-02-17: 14:00:00 via INTRAVENOUS

## 2018-02-16 MED ORDER — MAGNESIUM SULFATE 50 % IJ SOLN
40.0000 meq | INTRAMUSCULAR | Status: DC
  Filled 2018-02-16: qty 9.85

## 2018-02-16 MED ORDER — SODIUM CHLORIDE 0.9 % IV SOLN
30.0000 ug/min | INTRAVENOUS | Status: AC
  Administered 2018-02-17: 10 ug/min via INTRAVENOUS
  Filled 2018-02-16: qty 2

## 2018-02-16 MED ORDER — BISACODYL 5 MG PO TBEC
5.0000 mg | DELAYED_RELEASE_TABLET | Freq: Once | ORAL | Status: AC
  Administered 2018-02-16: 5 mg via ORAL
  Filled 2018-02-16: qty 1

## 2018-02-16 MED ORDER — FENTANYL CITRATE (PF) 100 MCG/2ML IJ SOLN
INTRAMUSCULAR | Status: AC
  Filled 2018-02-16: qty 2

## 2018-02-16 MED ORDER — SODIUM CHLORIDE 0.9% FLUSH
3.0000 mL | Freq: Two times a day (BID) | INTRAVENOUS | Status: DC
  Administered 2018-02-16: 3 mL via INTRAVENOUS

## 2018-02-16 MED ORDER — MORPHINE SULFATE (PF) 2 MG/ML IV SOLN: 2.0000 mg | INTRAVENOUS | Status: DC | PRN

## 2018-02-16 MED ORDER — HEPARIN (PORCINE) IN NACL 2-0.9 UNIT/ML-% IJ SOLN
INTRAMUSCULAR | Status: AC | PRN
  Administered 2018-02-16 (×2): 500 mL via INTRA_ARTERIAL

## 2018-02-16 MED ORDER — SODIUM CHLORIDE 0.9 % IV SOLN
1.5000 g | INTRAVENOUS | Status: AC
  Administered 2018-02-17: 1.5 g via INTRAVENOUS
  Filled 2018-02-16: qty 1.5

## 2018-02-16 MED ORDER — HEPARIN (PORCINE) IN NACL 2-0.9 UNIT/ML-% IJ SOLN
INTRAMUSCULAR | Status: AC
  Filled 2018-02-16: qty 1000

## 2018-02-16 MED ORDER — LIDOCAINE HCL (PF) 1 % IJ SOLN
INTRAMUSCULAR | Status: DC | PRN
  Administered 2018-02-16: 2 mL

## 2018-02-16 MED ORDER — POTASSIUM CHLORIDE 2 MEQ/ML IV SOLN
80.0000 meq | INTRAVENOUS | Status: DC
  Filled 2018-02-16: qty 40

## 2018-02-16 MED ORDER — HEPARIN (PORCINE) IN NACL 100-0.45 UNIT/ML-% IJ SOLN
1200.0000 [IU]/h | INTRAMUSCULAR | Status: DC
  Administered 2018-02-16: 1050 [IU]/h via INTRAVENOUS
  Filled 2018-02-16: qty 250

## 2018-02-16 MED ORDER — MIDAZOLAM HCL 2 MG/2ML IJ SOLN
INTRAMUSCULAR | Status: DC | PRN
  Administered 2018-02-16: 2 mg via INTRAVENOUS

## 2018-02-16 MED ORDER — ALBUTEROL SULFATE (2.5 MG/3ML) 0.083% IN NEBU
2.5000 mg | INHALATION_SOLUTION | Freq: Once | RESPIRATORY_TRACT | Status: AC
  Administered 2018-02-16: 2.5 mg via RESPIRATORY_TRACT

## 2018-02-16 MED ORDER — SODIUM CHLORIDE 0.9 % IV SOLN: INTRAVENOUS | Status: AC

## 2018-02-16 MED ORDER — SODIUM CHLORIDE 0.9 % IV SOLN
INTRAVENOUS | Status: DC
  Filled 2018-02-16: qty 30

## 2018-02-16 MED ORDER — VERAPAMIL HCL 2.5 MG/ML IV SOLN
INTRAVENOUS | Status: AC
  Filled 2018-02-16: qty 2

## 2018-02-16 MED ORDER — CHLORHEXIDINE GLUCONATE CLOTH 2 % EX PADS
6.0000 | MEDICATED_PAD | Freq: Once | CUTANEOUS | Status: AC
  Administered 2018-02-17: 6 via TOPICAL

## 2018-02-16 MED ORDER — MIDAZOLAM HCL 2 MG/2ML IJ SOLN
INTRAMUSCULAR | Status: AC
  Filled 2018-02-16: qty 2

## 2018-02-16 MED ORDER — IOPAMIDOL (ISOVUE-370) INJECTION 76%
INTRAVENOUS | Status: DC | PRN
  Administered 2018-02-16: 95 mL via INTRA_ARTERIAL

## 2018-02-16 MED ORDER — EPINEPHRINE PF 1 MG/ML IJ SOLN
0.0000 ug/min | INTRAMUSCULAR | Status: DC
  Filled 2018-02-16: qty 4

## 2018-02-16 MED ORDER — TRANEXAMIC ACID (OHS) PUMP PRIME SOLUTION
2.0000 mg/kg | INTRAVENOUS | Status: DC
  Filled 2018-02-16: qty 1.77

## 2018-02-16 MED ORDER — SODIUM CHLORIDE 0.9 % IV SOLN
1.5000 mg/kg/h | INTRAVENOUS | Status: AC
  Administered 2018-02-17: 1.5 mg/kg/h via INTRAVENOUS
  Filled 2018-02-16: qty 25

## 2018-02-16 MED ORDER — SODIUM CHLORIDE 0.9 % IV SOLN
1500.0000 mg | INTRAVENOUS | Status: AC
  Administered 2018-02-17: 1500 mg via INTRAVENOUS
  Filled 2018-02-16: qty 1500

## 2018-02-16 MED ORDER — HEPARIN SODIUM (PORCINE) 1000 UNIT/ML IJ SOLN
INTRAMUSCULAR | Status: DC | PRN
  Administered 2018-02-16: 4500 [IU] via INTRAVENOUS

## 2018-02-16 MED ORDER — SODIUM CHLORIDE 0.9 % IV SOLN
750.0000 mg | INTRAVENOUS | Status: DC
  Filled 2018-02-16: qty 750

## 2018-02-16 MED ORDER — METOPROLOL TARTRATE 12.5 MG HALF TABLET
12.5000 mg | ORAL_TABLET | Freq: Once | ORAL | Status: AC
  Administered 2018-02-17: 12.5 mg via ORAL
  Filled 2018-02-16: qty 1

## 2018-02-16 MED ORDER — SODIUM CHLORIDE 0.9% FLUSH: 3.0000 mL | INTRAVENOUS | Status: DC | PRN

## 2018-02-16 MED ORDER — PLASMA-LYTE 148 IV SOLN
INTRAVENOUS | Status: AC
  Administered 2018-02-17: 500 mL
  Filled 2018-02-16: qty 2.5

## 2018-02-16 MED ORDER — LIDOCAINE HCL (PF) 1 % IJ SOLN
INTRAMUSCULAR | Status: AC
  Filled 2018-02-16: qty 30

## 2018-02-16 MED ORDER — TEMAZEPAM 15 MG PO CAPS
15.0000 mg | ORAL_CAPSULE | Freq: Once | ORAL | Status: DC | PRN
  Filled 2018-02-16: qty 1

## 2018-02-16 MED ORDER — VERAPAMIL HCL 2.5 MG/ML IV SOLN
INTRAVENOUS | Status: DC | PRN
  Administered 2018-02-16: 08:00:00 via INTRA_ARTERIAL

## 2018-02-16 MED ORDER — OXYCODONE-ACETAMINOPHEN 5-325 MG PO TABS
1.0000 | ORAL_TABLET | ORAL | Status: DC | PRN
Start: 2018-02-16 — End: 2018-02-17

## 2018-02-16 MED ORDER — ONDANSETRON HCL 4 MG/2ML IJ SOLN
INTRAMUSCULAR | Status: DC | PRN
  Administered 2018-02-16: 4 mg via INTRAVENOUS

## 2018-02-16 MED ORDER — DOPAMINE-DEXTROSE 3.2-5 MG/ML-% IV SOLN
0.0000 ug/kg/min | INTRAVENOUS | Status: AC
  Administered 2018-02-17: 3 ug/kg/min via INTRAVENOUS
  Filled 2018-02-16: qty 250

## 2018-02-16 MED ORDER — TRANEXAMIC ACID (OHS) BOLUS VIA INFUSION
15.0000 mg/kg | INTRAVENOUS | Status: AC
  Administered 2018-02-17: 1324.5 mg via INTRAVENOUS
  Filled 2018-02-16: qty 1325

## 2018-02-16 MED ORDER — CHLORHEXIDINE GLUCONATE 0.12 % MT SOLN
15.0000 mL | Freq: Once | OROMUCOSAL | Status: AC
  Administered 2018-02-17: 15 mL via OROMUCOSAL
  Filled 2018-02-16: qty 15

## 2018-02-16 SURGICAL SUPPLY — 12 items
CATH INFINITI 5 FR JL3.5 (CATHETERS) ×2 IMPLANT
CATH INFINITI 5FR ANG PIGTAIL (CATHETERS) ×2 IMPLANT
CATH OPTITORQUE TIG 4.0 5F (CATHETERS) ×2 IMPLANT
DEVICE RAD COMP TR BAND LRG (VASCULAR PRODUCTS) ×2 IMPLANT
GLIDESHEATH SLEND A-KIT 6F 22G (SHEATH) ×2 IMPLANT
GUIDEWIRE INQWIRE 1.5J.035X260 (WIRE) ×1 IMPLANT
INQWIRE 1.5J .035X260CM (WIRE) ×2
KIT HEART LEFT (KITS) ×2 IMPLANT
PACK CARDIAC CATHETERIZATION (CUSTOM PROCEDURE TRAY) ×2 IMPLANT
SYR MEDRAD MARK V 150ML (SYRINGE) ×2 IMPLANT
TRANSDUCER W/STOPCOCK (MISCELLANEOUS) ×2 IMPLANT
TUBING CIL FLEX 10 FLL-RA (TUBING) ×2 IMPLANT

## 2018-02-16 NOTE — Progress Notes (Signed)
Pt returned from having cardiac cath.   Provided education regarding TR band and affected extremity.  Encourage patient to keep hand above heart and avoid lifting or placing pressure on hand.   Pt did not comply with instruction.  Monitored extremity-  Slight bruising noted after removal of TR band, but otherwise warm to touch, no pain at site and full sensation

## 2018-02-16 NOTE — Progress Notes (Signed)
Discussed sternal precautions, IS (easily 2500 mL), mobility, and d/c planning with pt and family. Voiced understanding. Gave OHS booklet, careguide, and moving in the tube sheet. Pt seems anxious but good understanding. Owns his own business.  [place], ACSM 2:16 PM 02/16/2018

## 2018-02-16 NOTE — Progress Notes (Signed)
ANTICOAGULATION CONSULT NOTE - Initial Consult  Pharmacy Consult for heparin Indication: chest pain/ACS  No Known Allergies  Patient Measurements: Height: 5\' 8"  (172.7 cm) Weight: 194 lb 9.6 oz (88.3 kg) IBW/kg (Calculated) : 68.4 Heparin Dosing Weight: 86.4 kg  Vital Signs: Temp: 97.7 F (36.5 C) (03/05 0521) Temp Source: Oral (03/05 0521) BP: 139/81 (03/05 0834) Pulse Rate: 61 (03/05 0834)  Labs: Recent Labs    02/15/18 1646  HGB 14.4  HCT 41.7  PLT 199  APTT 28  LABPROT 12.6  INR 0.95  CREATININE 1.48*  TROPONINI <0.03    Estimated Creatinine Clearance: 56.6 mL/min (A) (by C-G formula based on SCr of 1.48 mg/dL (H)).   Medical History: Past Medical History:  Diagnosis Date  . Hyperlipidemia 02/15/2018  . Kidney disease, chronic, stage III (GFR 30-59 ml/min) ([place]) 02/15/2018  . Kidney stones   . Obesity (BMI 30-39.9) 02/15/2018    Medications:  Scheduled:  . [START ON 02/17/2018] aspirin EC  81 mg Oral Daily  . atorvastatin  40 mg Oral q1800  . metoprolol tartrate  25 mg Oral BID  . pantoprazole  40 mg Oral Daily  . sodium chloride flush  3 mL Intravenous Q12H    Assessment: 2 yom who presented with 1 month exertional burning substernal chest discomfort. 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   Pharmacy consulted to start heparin 8 hours after sheath removal- documented at 0841 on 3/5. Hgb is stable at 14.4, plts WNL. No anticoag prior to admission.   Goal of Therapy:  Heparin level 0.3-0.7 units/ml Monitor platelets by anticoagulation protocol: Yes   Plan:  No heparin bolus given recent cath Start heparin infusion at 1050 units/hr starting at 1700 Check anti-Xa level in 6 hours and daily while on heparin Continue to monitor H&H and platelets  Doylene Canard, PharmD Clinical Pharmacist  Pager:  [phone] Clinical Phone for 02/16/2018 until 3:30pm: [phone] If after 3:30pm, please call main pharmacy at [phone] 02/16/2018,9:55 AM

## 2018-02-16 NOTE — Progress Notes (Signed)
Bilateral Pre CABG Dopplers completed. 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 Vertebral artery flow is antegrade. Bilateral ABIs, TBIs, and Doppler waveforms indicate normal arterial flow at rest. Palmar arch is normal bilaterally with both radial and ulnar compressions. [facility], [place] 02/16/2018, 5:41 PM

## 2018-02-16 NOTE — Progress Notes (Signed)
[place] for heparin Indication: chest pain/ACS  No Known Allergies  Patient Measurements: Height: 5\' 8"  (172.7 cm) Weight: 194 lb 9.6 oz (88.3 kg) IBW/kg (Calculated) : 68.4 Heparin Dosing Weight: 86.4 kg  Vital Signs: Temp: 97.6 F (36.4 C) (03/05 2335) Temp Source: Oral (03/05 2335) BP: 130/73 (03/05 2335) Pulse Rate: 55 (03/05 2335)  Labs: Recent Labs    02/15/18 1646 02/16/18 1958 02/16/18 2247  HGB 14.4  --   --   HCT 41.7  --   --   PLT 199  --   --   APTT 28 37*  --   LABPROT 12.6 13.5  --   INR 0.95 1.04  --   HEPARINUNFRC  --   --  0.27*  CREATININE 1.48* 1.54*  --   TROPONINI <0.03  --   --     Estimated Creatinine Clearance: 54.4 mL/min (A) (by C-G formula based on SCr of 1.54 mg/dL (H)).   Assessment: 70 yom who presented with 1 month exertional burning substernal chest discomfort. 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   Heparin restarted this afternoon. Heparin level below goal this evening at 0.27. No bleeding issues noted.    Goal of Therapy:  Heparin level 0.3-0.7 units/ml Monitor platelets by anticoagulation protocol: Yes   Plan:  Increase heparin infusion to 1200 units/hr Check anti-Xa level daily while on heparin Continue to monitor H&H and platelets  Erin Hearing PharmD., BCPS Clinical Pharmacist 02/16/2018 11:41 PM

## 2018-02-16 NOTE — H&P (Addendum)
[address]       [place] [address]             [phone]        Hrishikesh L Wallis  Medical Record #[id number] Date of Birth: [birth year]  Referring: Dr Ellyn Hack  Primary Care: Shirline Frees, MD Primary Cardiologist:No primary care provider on file.  Chief Complaint:   Chest pain.  History of Present Illness:    The patient is a 62 year old male who for approximately 1 month has had symptoms of exertional burning type substernal chest discomfort with activity.  He noted the symptoms were worse if he eats a meal and then goes out and does some type of activity.  The pain has occurred on a more frequent basis and at a lower level of exercise.  He was seen by his primary care physician who placed him on a PPI.  He was then seen by cardiology and he showed significant T wave inversions in the inferior and anterior leads and he was recommended to be admitted to the hospital for further evaluation and treatment.  He has a history of hyperlipidemia and obesity.  He has a history of stage III chronic kidney disease.  He does not have a history of diabetes but his blood sugar is noted to be elevated at 120.  Hemoglobin A1c has not been done.  He underwent cardiac catheterization on today's date in the  results are listed below.  Due to the findings of severe multivessel coronary artery disease we are asked to see the patient in cardiothoracic surgical consultation for consideration of CABG.    Current Activity/ Functional Status: Patient is independent with mobility/ambulation, transfers, ADL's, IADL's.   Zubrod Score: At the time of surgery this patient's most appropriate activity status/level should be described as: []     0    Normal activity, no symptoms [x]     1    Restricted in physical strenuous activity but ambulatory, able to do out light work []     2    Ambulatory and capable of self care, unable to do work activities, up and about                 more  than 50%  Of the time                            []     3    Only limited self care, in bed greater than 50% of waking hours []     4    Completely disabled, no self care, confined to bed or chair []     5    Moribund  Past Medical History:  Diagnosis Date  . Hyperlipidemia 02/15/2018  . Kidney disease, chronic, stage III (GFR 30-59 ml/min) ([facility]) 02/15/2018  . Kidney stones   . Obesity (BMI 30-39.9) 02/15/2018    Past Surgical History:  Procedure Laterality Date  . COLONOSCOPY  2007  . dental implants    . LASIK     EYES  . LEFT HEART CATH AND CORONARY ANGIOGRAPHY N/A 02/16/2018   Procedure: LEFT HEART CATH AND CORONARY ANGIOGRAPHY;  Surgeon: Leonie Man, MD;  Location: [facility];  Service: Cardiovascular;  Laterality: N/A;  . LITHOTRIPSY    . POLYPECTOMY      Social History   Tobacco Use  Smoking Status Former Smoker  Smokeless Tobacco Never Used  Tobacco Comment  40 years ago    Social History   Substance and Sexual Activity  Alcohol Use No    Social History   Socioeconomic History  . Marital status: Married    Spouse name: Not on file  . Number of children: Not on file  . Years of education: Not on file  . Highest education level: Not on file  Social Needs  . Financial resource strain: Not on file  . Food insecurity - worry: Not on file  . Food insecurity - inability: Not on file  . Transportation needs - medical: Not on file  . Transportation needs - non-medical: Not on file  Occupational History  . [employer]   Tobacco Use  . Smoking status: Former Research scientist (life sciences)  . Smokeless tobacco: Never Used  . Tobacco comment: 40 years ago  Substance and Sexual Activity  . Alcohol use: No  . Drug use: No  . Sexual activity: Yes    No Known Allergies  Current Facility-Administered Medications  Medication Dose Route Frequency Provider Last Rate Last Dose  . 0.9 %  sodium chloride infusion   Intravenous Continuous Leonie Man, MD 125 mL/hr  at 02/16/18 1100    . 0.9 %  sodium chloride infusion  250 mL Intravenous PRN Leonie Man, MD      . acetaminophen (TYLENOL) tablet 650 mg  650 mg Oral Q4H PRN Jacolyn Reedy, MD   650 mg at 02/16/18 1127  . [START ON 02/17/2018] aspirin EC tablet 81 mg  81 mg Oral Daily Jacolyn Reedy, MD      . atorvastatin (LIPITOR) tablet 40 mg  40 mg Oral q1800 Jacolyn Reedy, MD   40 mg at 02/15/18 2201  . [START ON 02/17/2018] cefUROXime (ZINACEF) 1.5 g in sodium chloride 0.9 % 100 mL IVPB  1.5 g Intravenous To OR Grace Isaac, MD      . Derrill Memo ON 02/17/2018] cefUROXime (ZINACEF) 750 mg in sodium chloride 0.9 % 100 mL IVPB  750 mg Intravenous To OR Grace Isaac, MD      . Derrill Memo ON 02/17/2018] dexmedetomidine (PRECEDEX) 400 MCG/100ML (4 mcg/mL) infusion  0.1-0.7 mcg/kg/hr Intravenous To OR Grace Isaac, MD      . Derrill Memo ON 02/17/2018] DOPamine (INTROPIN) 800 mg in dextrose 5 % 250 mL (3.2 mg/mL) infusion  0-10 mcg/kg/min Intravenous To OR Grace Isaac, MD      . Derrill Memo ON 02/17/2018] EPINEPHrine (ADRENALIN) 4 mg in dextrose 5 % 250 mL (0.016 mg/mL) infusion  0-10 mcg/min Intravenous To OR Grace Isaac, MD      . Derrill Memo ON 02/17/2018] heparin 2,500 Units, papaverine 30 mg in electrolyte-148 (PLASMALYTE-148) 500 mL irrigation   Irrigation To OR Grace Isaac, MD      . Derrill Memo ON 02/17/2018] heparin 30,000 units/NS 1000 mL solution for CELLSAVER   Other To OR Grace Isaac, MD      . heparin ADULT infusion 100 units/mL (25000 units/271m sodium chloride 0.45%)  1,050 Units/hr Intravenous Continuous TJacolyn Reedy MD      . [Derrill MemoON 02/17/2018] insulin regular (NOVOLIN R,HUMULIN R) 100 Units in sodium chloride 0.9 % 100 mL (1 Units/mL) infusion   Intravenous To OR GGrace Isaac MD      . [Derrill MemoON 02/17/2018] magnesium sulfate (IV Push/IM) injection 40 mEq  40 mEq Other To OR GGrace Isaac MD      . metoprolol tartrate (LOPRESSOR) tablet 25 mg  25 mg Oral BID  Jacolyn Reedy, MD   25 mg at 02/16/18 1127  . [START ON 02/17/2018] milrinone (PRIMACOR) 20 MG/100 ML (0.2 mg/mL) infusion  0.125 mcg/kg/min Intravenous To OR Grace Isaac, MD      . morphine 2 MG/ML injection 2 mg  2 mg Intravenous Q2H PRN Leonie Man, MD      . nitroGLYCERIN (NITROSTAT) SL tablet 0.4 mg  0.4 mg Sublingual Q5 Min x 3 PRN Jacolyn Reedy, MD      . Derrill Memo ON 02/17/2018] nitroGLYCERIN 50 mg in dextrose 5 % 250 mL (0.2 mg/mL) infusion  2-200 mcg/min Intravenous To OR Grace Isaac, MD      . ondansetron [facility]) injection 4 mg  4 mg Intravenous Q6H PRN Jacolyn Reedy, MD      . pantoprazole (PROTONIX) EC tablet 40 mg  40 mg Oral Daily Leonie Man, MD   40 mg at 02/16/18 1127  . [START ON 02/17/2018] phenylephrine (NEO-SYNEPHRINE) 20 mg in sodium chloride 0.9 % 250 mL (0.08 mg/mL) infusion  30-200 mcg/min Intravenous To OR Grace Isaac, MD      . Derrill Memo ON 02/17/2018] potassium chloride injection 80 mEq  80 mEq Other To OR Grace Isaac, MD      . sodium chloride flush (NS) 0.9 % injection 3 mL  3 mL Intravenous Q12H Leonie Man, MD      . sodium chloride flush (NS) 0.9 % injection 3 mL  3 mL Intravenous PRN Leonie Man, MD      . Derrill Memo ON 02/17/2018] tranexamic acid (CYKLOKAPRON) 2,500 mg in sodium chloride 0.9 % 250 mL (10 mg/mL) infusion  1.5 mg/kg/hr Intravenous To OR Grace Isaac, MD      . Derrill Memo ON 02/17/2018] tranexamic acid (CYKLOKAPRON) bolus via infusion - over 30 minutes 1,324.5 mg  15 mg/kg Intravenous To OR Grace Isaac, MD      . Derrill Memo ON 02/17/2018] tranexamic acid (CYKLOKAPRON) pump prime solution 177 mg  2 mg/kg Intracatheter To OR Grace Isaac, MD      . Derrill Memo ON 02/17/2018] vancomycin (VANCOCIN) 1,500 mg in sodium chloride 0.9 % 250 mL IVPB  1,500 mg Intravenous To OR Grace Isaac, MD        Facility-Administered Medications Prior to Admission  Medication Dose Route Frequency Provider Last Rate Last Dose   . [DISCONTINUED] 0.9 %  sodium chloride infusion  500 mL Intravenous Continuous Inda Castle, MD      . [DISCONTINUED] 0.9 %  sodium chloride infusion  500 mL Intravenous Continuous Nelida Meuse III, MD       Medications Prior to Admission  Medication Sig Dispense Refill Last Dose  . omeprazole (PRILOSEC) 20 MG capsule Take 20 mg by mouth daily.  1 02/15/2018 at Unknown time    Family History  Problem Relation Age of Onset  . Heart disease Mother   . Heart disease Father   . Renal Disease Sister   . Hyperlipidemia Sister   . Hyperlipidemia Sister   . Colon cancer Neg Hx        fam hx unknown- per pt "do not ask anything about my family"   LEFT HEART CATH AND CORONARY ANGIOGRAPHY  Conclusion     Mid RCA lesion is 99% stenosed. RPDA lesion is 100% stenosed.  Lat Ramus-1 lesion is 60% stenosed. Lat Ramus-2 lesion is 80% stenosed.  Ost 1st Diag-1 lesion is 90% stenosed.  Ost 1st Diag-2 lesion is 70% stenosed.  Prox LAD lesion is 95% stenosed. Mid LAD lesion is 70% stenosed. Mid LAD to Dist LAD lesion is 100% stenosed.  Ost Cx lesion is 99% stenosed. Prox Cx to Mid Cx lesion is 45% stenosed.  The left ventricular systolic function is normal. The left ventricular ejection fraction is 50-55% by visual estimate.  LV end diastolic pressure is normal.   Severe multivessel disease with extensive disease in the LAD and major diagonal branch as well as the first major branch of the large ramus intermedius. The ostial circumflex as well as mid RCA also have severe disease.  Thankfully, preserved EF with may be inferoapical hypokinesis.  Relatively normal LVEDP of 12 mmHg.   Patient has multivessel disease best suited for bypass surgery.  We have consulted CT surgery. Dr. Wynonia Lawman was present to review the results and discussed with the patient.  Plan: CVTS Consulted Return to nursing unit for ongoing care.  We will start IV heparin drip. Aggressive risk factor  modification   Glenetta Hew, M.D., M.S. Interventional Cardiologist   Pager # [phone] Phone # [phone] [address] [place], [place] [address]    Indications   Unstable angina (HCC) [I20.0 (ICD-10-CM)]  Procedural Details/Technique   Technical Details Referring Physician: Dr. Samara Snide Primary Cardiologist: Wynonia Lawman  Mr. Cortopassi is a very pleasant 62 year old gentleman with a history of hypertension, hyperlipidemia, past smoker who has mild chronic kidney disease. He was seen by Dr. Wynonia Lawman in consultation for abnormal EKG and progressively worsening chest tightness/burning with exertion. Dr. Wynonia Lawman was concerned based on the EKG and the patient symptoms of this was consistent with progressive unstable angina and recommended the patient be admitted for IV hydration and cardiac catheterization.  Time Out: Verified patient identification, verified procedure, site/side was marked, verified correct patient position, special equipment/implants available, medications/allergies/relevent history reviewed, required imaging and test results available. Performed. Consent Signed.   Access:  * RIGHT Radial Artery: 6 Fr sheath -- Seldinger technique using Angiocath Micropuncture Kit * 10 mL radial cocktail IA; 4500 Units IV Heparin  Left Heart Catheterization: 5Fr Catheters advanced or exchanged over a J-wire under direct fluoroscopic guidance into the ascending aorta; TIG 4.0 catheter advanced first.  * Left Coronary Artery Cineangiography: JL4 Catheter  * Right Coronary Artery Cineangiography: TIG 4.0 Catheter  * LV Hemodynamics (LV Gram): Angled Pigtail Catheter  - After completion of angiography, the catheter was removed completely out of the body over wire without complication.  Radial Sheath(s) removed in the Cath Lab with TR Band placed for hemostasis.   TR Band: 0835 Hours; 12 mL air  MEDICATIONS * SQ Lidocaine 71m * Radial Cocktail: 3 mg Verapamil in 10 mL  NS * Isovue Contrast: 95 mL * Heparin: 4500 Units * IC NTG 200 mcg x 1  Fluoro time: 7 minutes. Dose Area Product: 20624 mGycm2. Cumulative Air Kerma: 417 mGy.   Estimated blood loss <50 mL.  During this procedure the patient was administered the following to achieve and maintain moderate conscious sedation: Versed 2 mg, Fentanyl 50 mcg, while the patient's heart rate, blood pressure, and oxygen saturation were continuously monitored. The period of conscious sedation was 48 minutes, of which I was present face-to-face 100% of this time.  Complications   Complications documented before study signed (02/16/2018 9:08 AM EST)    No complications were associated with this study.  Documented by HLeonie Man MD - 02/16/2018 8:59 AM EST    Coronary  Findings   Diagnostic  Dominance: Right  Left Main  Vessel is large.  Left Anterior Descending  Prox LAD lesion 95% stenosed  Prox LAD lesion is 95% stenosed. The lesion is located at the bifurcation, segmental, eccentric and irregular.  Mid LAD lesion 70% stenosed  Mid LAD lesion is 70% stenosed.  Mid LAD to Dist LAD lesion 100% stenosed  Mid LAD to Dist LAD lesion is 100% stenosed. The lesion is segmental, ulcerative and chronically occluded with bridging and left-to-left collateral flow. Subtotal occluded -TIMI I flow distal  First Diagonal Branch  Vessel is moderate in size.  Ost 1st Diag-1 lesion 90% stenosed  Ost 1st Diag-1 lesion is 90% stenosed.  Ost 1st Diag-2 lesion 70% stenosed  Ost 1st Diag-2 lesion is 70% stenosed.  First Septal Branch  Vessel is moderate in size. Vessel is angiographically normal.  Second Diagonal Branch  Vessel is small in size.  Third Diagonal Branch  Vessel is small in size.  Collaterals  3rd Diag filled by collaterals from 1st Diag.    Third Septal Branch  Collaterals  3rd Sept filled by collaterals from 1st Sept.    Ramus Intermedius  Vessel is large and very large. This vessel branches in  the mid vessel to to moderate large-caliber branches. This is perhaps the only "normal "vessel.  Lateral Ramus Intermedius  Vessel is moderate in size.  Lat Ramus-1 lesion 60% stenosed  Lat Ramus-1 lesion is 60% stenosed. The lesion is eccentric and irregular.  Lat Ramus-2 lesion 80% stenosed  Lat Ramus-2 lesion is 80% stenosed. The lesion is segmental and irregular.  Left Circumflex  Ost Cx lesion 99% stenosed  Ost Cx lesion is 99% stenosed. The lesion is located at the major branch, discrete and concentric.  Prox Cx to Mid Cx lesion 45% stenosed  Prox Cx to Mid Cx lesion is 45% stenosed. The lesion is segmental and eccentric.  Right Coronary Artery  Mid RCA lesion 99% stenosed  Mid RCA lesion is 99% stenosed. The lesion is located at the bend, eccentric, irregular and ulcerative.  Right Posterior Descending Artery  Collaterals  RPDA filled by collaterals from 1st Sept.    RPDA lesion 100% stenosed  RPDA lesion is 100% stenosed. The lesion is chronically occluded.  Right Posterior Atrioventricular Branch  Vessel is moderate in size.  First Right Posterolateral  Vessel is small in size.  Second Right Posterolateral  Vessel is small in size.  Intervention   No interventions have been documented.  Wall Motion              Left Heart   Left Ventricle The left ventricular size is normal. The left ventricular systolic function is normal. Likely low normal LV end diastolic pressure is normal. The left ventricular ejection fraction is 50-55% by visual estimate. There are LV function abnormalities due to segmental dysfunction. There is trivial (1+) mitral regurgitation.  Aortic Valve There is no aortic valve stenosis. There is normal aortic valve motion.  Coronary Diagrams   Diagnostic Diagram          Most Recent Value  AO Systolic Pressure 158 mmHg  AO Diastolic Pressure 73 mmHg  AO Mean 99 mmHg  LV Systolic Pressure 309 mmHg  LV Diastolic Pressure 2 mmHg  LV EDP 9  mmHg  Arterial Occlusion Pressure Extended Systolic Pressure 407 mmHg  Arterial Occlusion Pressure Extended Diastolic Pressure 67 mmHg  Arterial Occlusion Pressure Extended Mean Pressure 90 mmHg  Left Ventricular Apex Extended Systolic  Pressure 120 mmHg  Left Ventricular Apex Extended Diastolic Pressure 4 mmHg  Left Ventricular Apex Extended EDP Pressure 10 mmHg     Review of Systems:   Review of Systems  Constitutional: Positive for malaise/fatigue. Negative for chills, diaphoresis, fever and weight loss.  HENT: Negative for congestion, ear discharge, ear pain, hearing loss, nosebleeds, sinus pain, sore throat and tinnitus.   Eyes: Negative.   Respiratory: Positive for shortness of breath. Negative for cough, hemoptysis, sputum production, wheezing and stridor.   Cardiovascular: Positive for chest pain. Negative for palpitations, orthopnea, claudication, leg swelling and PND.  Gastrointestinal: Positive for heartburn. Negative for abdominal pain, blood in stool, constipation, diarrhea, melena, nausea and vomiting.  Genitourinary: Negative.   Musculoskeletal: Positive for neck pain. Negative for back pain, falls, joint pain and myalgias.  Skin: Negative.   Neurological: Positive for tingling, weakness and headaches. Negative for dizziness, tremors, sensory change, speech change, focal weakness, seizures and loss of consciousness.  Endo/Heme/Allergies: Negative for environmental allergies and polydipsia. Does not bruise/bleed easily.  Psychiatric/Behavioral: Negative for depression, hallucinations, memory loss, substance abuse and suicidal ideas. The patient is not nervous/anxious and does not have insomnia.     Physical Exam: BP 133/84   Pulse 65   Temp 97.7 F (36.5 C) (Oral)   Resp 12   Ht 5' 8"  (1.727 m)   Wt 194 lb 9.6 oz (88.3 kg)   SpO2 100%   BMI 29.59 kg/m   Physical Exam  Constitutional: He is oriented to person, place, and time. He appears well-developed and  well-nourished. No distress.  HENT:  Head: Normocephalic and atraumatic.  Mouth/Throat: Oropharynx is clear and moist. No oropharyngeal exudate.  Eyes: Conjunctivae and EOM are normal. Pupils are equal, round, and reactive to light. Right eye exhibits no discharge. Left eye exhibits no discharge. No scleral icterus.  Neck: Normal range of motion. Neck supple. No JVD present. No tracheal deviation present. No thyromegaly present.  Cardiovascular: Normal rate, regular rhythm and intact distal pulses. Exam reveals no gallop and no friction rub.  No murmur heard.  without venous stasis changes or vericosities  Pulmonary/Chest: Breath sounds normal. No stridor. No respiratory distress. He has no wheezes. He has no rales. He exhibits no tenderness.  Abdominal: Soft. Bowel sounds are normal. He exhibits no distension. There is no tenderness. There is no rebound and no guarding.  Genitourinary:  Genitourinary Comments: Not examined   Musculoskeletal: He exhibits no edema, tenderness or deformity.  Lymphadenopathy:    He has no cervical adenopathy.  Neurological: He is alert and oriented to person, place, and time.  Grossly normal   Skin: Skin is warm and dry. No rash noted. He is not diaphoretic. No erythema. No pallor.  Psychiatric: He has a normal mood and affect. His behavior is normal. Judgment and thought content normal.     Diagnostic Studies & Laboratory data:     Recent Radiology Findings:   X-ray Chest Pa And Lateral  Result Date: 02/15/2018 CLINICAL DATA:  Unstable angina.  For catheterization tomorrow. EXAM: CHEST  2 VIEW COMPARISON:  Abdominal radiographs 10/24/2010. FINDINGS: The heart size and mediastinal contours are normal. The lungs are clear. There is no pleural effusion or pneumothorax. No acute osseous findings are identified. IMPRESSION: No active cardiopulmonary process. Electronically Signed   By: Richardean Sale M.D.   On: 02/15/2018 17:31     I have independently  reviewed the above radiologic studies.  Recent Lab Findings: Lab Results  Component Value Date  WBC 7.4 02/15/2018   HGB 14.4 02/15/2018   HCT 41.7 02/15/2018   PLT 199 02/15/2018   GLUCOSE 120 (H) 02/15/2018   CHOL 239 (H) 02/15/2018   TRIG 249 (H) 02/15/2018   HDL 46 02/15/2018   LDLCALC 143 (H) 02/15/2018   ALT 22 02/15/2018   AST 24 02/15/2018   NA 141 02/15/2018   K 3.7 02/15/2018   CL 103 02/15/2018   CREATININE 1.48 (H) 02/15/2018   BUN 15 02/15/2018   CO2 26 02/15/2018   TSH 1.188 02/15/2018   INR 0.95 02/15/2018   Chronic Kidney Disease   Stage I     GFR >90  Stage II    GFR 60-89  Stage IIIA GFR 45-59  Stage IIIB GFR 30-44  Stage IV   GFR 15-29  Stage V    GFR  <15  Lab Results  Component Value Date   CREATININE 1.48 (H) 02/15/2018   Estimated Creatinine Clearance: 56.6 mL/min (A) (by C-G formula based on SCr of 1.48 mg/dL (H)).   Assessment / Plan: 62 year old male with severe multivessel coronary artery disease,  Accelerating angina, with findings consistent with metabolic syndrome, HTN, Obesity, hyperlipidemia and insulin resistance.  Stage III chronic kidney disease  I have reviewed with the patient and his wife and son cath films agree with recommendation to proceed with coronary artery bypass graft The goals risks and alternatives of the planned surgical procedure coronary artery bypass grafting have been discussed with the patient in detail. The risks of the procedure including death, infection, stroke, myocardial infarction, bleeding, blood transfusion have all been discussed specifically.  With the patient's known chronic kidney disease increased risk of complications is also been specifically discussed.  The patient and his family's questions have been answered I have quoted Richard Miu a 2 % of perioperative mortality and a complication rate as high as 30 %. The patient's questions have been answered.BECKHAM CAPISTRAN is willing  to proceed with the  planned procedure in am.  Grace Isaac MD      [address] [place] [address] Office [phone]   [place]

## 2018-02-16 NOTE — Progress Notes (Signed)
Pt. HR down to 39 non sustained. Pt. Alert and stable. Denies any discomfort. RN will continue to monitor.

## 2018-02-16 NOTE — Progress Notes (Signed)
Subjective:  Seen in cath lab earlier and now at lunch.  C/o headache.  No chest pain  Objective:  Vital Signs in the last 24 hours: BP 133/84   Pulse 65   Temp 97.7 F (36.5 C) (Oral)   Resp 12   Ht 5\' 8"  (1.727 m)   Wt 88.3 kg (194 lb 9.6 oz)   SpO2 100%   BMI 29.59 kg/m   Physical Exam: Pleasant WM in NAD Lungs:  Clear Cardiac:  Regular rhythm, normal S1 and S2, no S3 Extremities:  Radial site clean and dry   Intake/Output from previous day: 03/04 0701 - 03/05 0700 In: 240 [P.O.:240] Out: 975 [Urine:975]  Weight Filed Weights   02/15/18 1643 02/16/18 0521  Weight: 88.4 kg (194 lb 14.4 oz) 88.3 kg (194 lb 9.6 oz)    Lab Results: Basic Metabolic Panel: Recent Labs    02/15/18 1646  NA 141  K 3.7  CL 103  CO2 26  GLUCOSE 120*  BUN 15  CREATININE 1.48*   CBC: Recent Labs    02/15/18 1646  WBC 7.4  NEUTROABS 4.7  HGB 14.4  HCT 41.7  MCV 90.1  PLT 199   Cardiac Panel (last 3 results) Recent Labs    02/15/18 1646  TROPONINI <0.03    Telemetry: Sinus rhythm  Assessment/Plan:  1.  Severe three-vessel disease not candidate for stenting 2.  Hyperlipidemia 3.  Elevation of glucose 4.  Stage III chronic kidney disease  Recommendations:  Discussed with patient.  We will check hemoglobin A1c.  Questions answered regarding surgery.  He is anxious to return to work.  Recheck kidney function in morning after cath.     Kerry Hough  MD [facility]  02/16/2018, 1:02 PM

## 2018-02-16 NOTE — Interval H&P Note (Signed)
History and Physical Interval Note:  02/16/2018 7:31 AM  Todd Buck  has presented today for surgery, with the diagnosis of unstable angina.   The various methods of treatment have been discussed with the patient and family. After consideration of risks, benefits and other options for treatment, the patient has consented to  Procedure(s): LEFT HEART CATH AND CORONARY ANGIOGRAPHY (N/A)  With possible PERCUTANEOUS CORONARY INTERVENTION as a surgical intervention .  The patient's history has been reviewed, patient examined, no change in status, stable for surgery.  I have reviewed the patient's chart and labs.  Questions were answered to the patient's satisfaction.    Cath Lab Visit (complete for each Cath Lab visit)  Clinical Evaluation Leading to the Procedure:   ACS: Yes.    Non-ACS:    Anginal Classification: CCS III  Anti-ischemic medical therapy: Minimal Therapy (1 class of medications)  Non-Invasive Test Results: No non-invasive testing performed  Prior CABG: No previous CABG   Glenetta Hew

## 2018-02-16 NOTE — Anesthesia Preprocedure Evaluation (Addendum)
Anesthesia Evaluation  Patient identified by MRN, date of birth, ID band Patient awake    Reviewed: Allergy & Precautions, H&P , NPO status , Patient's Chart, lab work & pertinent test results  Airway Mallampati: II  TM Distance: >3 FB Neck ROM: Full    Dental no notable dental hx. (+) Teeth Intact, Dental Advisory Given   Pulmonary neg pulmonary ROS, former smoker,    Pulmonary exam normal breath sounds clear to auscultation       Cardiovascular Exercise Tolerance: Good + angina + CAD   Rhythm:Regular Rate:Normal     Neuro/Psych negative neurological ROS  negative psych ROS   GI/Hepatic negative GI ROS, Neg liver ROS,   Endo/Other  negative endocrine ROS  Renal/GU Renal InsufficiencyRenal disease  negative genitourinary   Musculoskeletal   Abdominal   Peds  Hematology negative hematology ROS (+)   Anesthesia Other Findings   Reproductive/Obstetrics negative OB ROS                            Anesthesia Physical Anesthesia Plan  ASA: IV  Anesthesia Plan: General   Post-op Pain Management:    Induction: Intravenous  PONV Risk Score and Plan: 2 and Midazolam and Treatment may vary due to age or medical condition  Airway Management Planned: Oral ETT  Additional Equipment: Arterial line, CVP, PA Cath, TEE and Ultrasound Guidance Line Placement  Intra-op Plan:   Post-operative Plan: Post-operative intubation/ventilation  Informed Consent: I have reviewed the patients History and Physical, chart, labs and discussed the procedure including the risks, benefits and alternatives for the proposed anesthesia with the patient or authorized representative who has indicated his/her understanding and acceptance.   Dental advisory given  Plan Discussed with: CRNA  Anesthesia Plan Comments:         Anesthesia Quick Evaluation

## 2018-02-16 NOTE — Plan of Care (Signed)
  Education: Knowledge of General Education information will improve 02/16/2018 2308 - Progressing by Barton Dubois, RN Pt. Aware of general education concerning health.    Health Behavior/Discharge Planning: Ability to manage health-related needs will improve 02/16/2018 2308 - Progressing by Barton Dubois, RN Pt. Has family support and understands the needs concerning his health including test, procedures, and treatment.    Activity: Risk for activity intolerance will decrease 02/16/2018 2308 - Progressing by Barton Dubois, RN   Coping: Level of anxiety will decrease 02/16/2018 2308 - Progressing by Barton Dubois, RN

## 2018-02-17 ENCOUNTER — Inpatient Hospital Stay (HOSPITAL_COMMUNITY): Payer: BLUE CROSS/BLUE SHIELD | Admitting: Critical Care Medicine

## 2018-02-17 ENCOUNTER — Ambulatory Visit: Payer: BLUE CROSS/BLUE SHIELD | Admitting: Physician Assistant

## 2018-02-17 ENCOUNTER — Inpatient Hospital Stay (HOSPITAL_COMMUNITY): Payer: BLUE CROSS/BLUE SHIELD

## 2018-02-17 ENCOUNTER — Encounter (HOSPITAL_COMMUNITY)
Admission: AD | Disposition: A | Discharge status: Home / Self Care | Payer: Self-pay | Source: Ambulatory Visit | Attending: Cardiothoracic Surgery

## 2018-02-17 DIAGNOSIS — Z951 Presence of aortocoronary bypass graft: Secondary | ICD-10-CM

## 2018-02-17 HISTORY — PX: CORONARY ARTERY BYPASS GRAFT: SHX141

## 2018-02-17 HISTORY — DX: Presence of aortocoronary bypass graft: Z95.1

## 2018-02-17 HISTORY — PX: TEE WITHOUT CARDIOVERSION: SHX5443

## 2018-02-17 LAB — POCT I-STAT 3, ART BLOOD GAS (G3+)
ACID-BASE EXCESS: 1 mmol/L (ref 0.0–2.0)
Acid-Base Excess: 1 mmol/L (ref 0.0–2.0)
Acid-base deficit: 1 mmol/L (ref 0.0–2.0)
Acid-base deficit: 1 mmol/L (ref 0.0–2.0)
BICARBONATE: 24.7 mmol/L (ref 20.0–28.0)
BICARBONATE: 26.4 mmol/L (ref 20.0–28.0)
Bicarbonate: 26.2 mmol/L (ref 20.0–28.0)
Bicarbonate: 23.1 mmol/L (ref 20.0–28.0)
O2 SAT: 99 %
O2 Saturation: 100 %
O2 Saturation: 96 %
O2 Saturation: 99 %
PCO2 ART: 41.9 mmHg (ref 32.0–48.0)
PCO2 ART: 43.5 mmHg (ref 32.0–48.0)
PCO2 ART: 35.4 mmHg (ref 32.0–48.0)
PH ART: 7.404 (ref 7.350–7.450)
PO2 ART: 86 mmHg (ref 83.0–108.0)
TCO2: 27 mmol/L (ref 22–32)
TCO2: 26 mmol/L (ref 22–32)
TCO2: 24 mmol/L (ref 22–32)
TCO2: 28 mmol/L (ref 22–32)
pCO2 arterial: 43.8 mmHg (ref 32.0–48.0)
pH, Arterial: 7.358 (ref 7.350–7.450)
pH, Arterial: 7.42 (ref 7.350–7.450)
pH, Arterial: 7.388 (ref 7.350–7.450)
pO2, Arterial: 329 mmHg — ABNORMAL HIGH (ref 83.0–108.0)
pO2, Arterial: 117 mmHg — ABNORMAL HIGH (ref 83.0–108.0)
pO2, Arterial: 120 mmHg — ABNORMAL HIGH (ref 83.0–108.0)

## 2018-02-17 LAB — POCT I-STAT, CHEM 8
BUN: 21 mg/dL — ABNORMAL HIGH (ref 6–20)
BUN: 17 mg/dL (ref 6–20)
BUN: 20 mg/dL (ref 6–20)
BUN: 20 mg/dL (ref 6–20)
BUN: 20 mg/dL (ref 6–20)
BUN: 22 mg/dL — ABNORMAL HIGH (ref 6–20)
CALCIUM ION: 0.99 mmol/L — AB (ref 1.15–1.40)
CHLORIDE: 100 mmol/L — AB (ref 101–111)
CHLORIDE: 103 mmol/L (ref 101–111)
CHLORIDE: 103 mmol/L (ref 101–111)
CREATININE: 1.4 mg/dL — AB (ref 0.61–1.24)
CREATININE: 1 mg/dL (ref 0.61–1.24)
Calcium, Ion: 1.21 mmol/L (ref 1.15–1.40)
Calcium, Ion: 1.16 mmol/L (ref 1.15–1.40)
Calcium, Ion: 1.07 mmol/L — ABNORMAL LOW (ref 1.15–1.40)
Calcium, Ion: 1.06 mmol/L — ABNORMAL LOW (ref 1.15–1.40)
Calcium, Ion: 1.02 mmol/L — ABNORMAL LOW (ref 1.15–1.40)
Chloride: 103 mmol/L (ref 101–111)
Chloride: 106 mmol/L (ref 101–111)
Chloride: 106 mmol/L (ref 101–111)
Creatinine, Ser: 1 mg/dL (ref 0.61–1.24)
Creatinine, Ser: 1.2 mg/dL (ref 0.61–1.24)
Creatinine, Ser: 1.2 mg/dL (ref 0.61–1.24)
Creatinine, Ser: 1.2 mg/dL (ref 0.61–1.24)
GLUCOSE: 107 mg/dL — AB (ref 65–99)
GLUCOSE: 187 mg/dL — AB (ref 65–99)
Glucose, Bld: 115 mg/dL — ABNORMAL HIGH (ref 65–99)
Glucose, Bld: 100 mg/dL — ABNORMAL HIGH (ref 65–99)
Glucose, Bld: 152 mg/dL — ABNORMAL HIGH (ref 65–99)
Glucose, Bld: 119 mg/dL — ABNORMAL HIGH (ref 65–99)
HCT: 29 % — ABNORMAL LOW (ref 39.0–52.0)
HEMATOCRIT: 42 % (ref 39.0–52.0)
HEMATOCRIT: 37 % — AB (ref 39.0–52.0)
HEMATOCRIT: 34 % — AB (ref 39.0–52.0)
HEMATOCRIT: 32 % — AB (ref 39.0–52.0)
HEMATOCRIT: 36 % — AB (ref 39.0–52.0)
HEMOGLOBIN: 14.3 g/dL (ref 13.0–17.0)
HEMOGLOBIN: 12.6 g/dL — AB (ref 13.0–17.0)
HEMOGLOBIN: 9.9 g/dL — AB (ref 13.0–17.0)
HEMOGLOBIN: 11.6 g/dL — AB (ref 13.0–17.0)
HEMOGLOBIN: 12.2 g/dL — AB (ref 13.0–17.0)
Hemoglobin: 10.9 g/dL — ABNORMAL LOW (ref 13.0–17.0)
POTASSIUM: 3.9 mmol/L (ref 3.5–5.1)
POTASSIUM: 5 mmol/L (ref 3.5–5.1)
POTASSIUM: 4.5 mmol/L (ref 3.5–5.1)
POTASSIUM: 4.2 mmol/L (ref 3.5–5.1)
Potassium: 4 mmol/L (ref 3.5–5.1)
Potassium: 3.4 mmol/L — ABNORMAL LOW (ref 3.5–5.1)
SODIUM: 143 mmol/L (ref 135–145)
SODIUM: 140 mmol/L (ref 135–145)
SODIUM: 138 mmol/L (ref 135–145)
SODIUM: 139 mmol/L (ref 135–145)
Sodium: 142 mmol/L (ref 135–145)
Sodium: 142 mmol/L (ref 135–145)
TCO2: 28 mmol/L (ref 22–32)
TCO2: 25 mmol/L (ref 22–32)
TCO2: 27 mmol/L (ref 22–32)
TCO2: 25 mmol/L (ref 22–32)
TCO2: 26 mmol/L (ref 22–32)
TCO2: 26 mmol/L (ref 22–32)

## 2018-02-17 LAB — CBC
HCT: 44 % (ref 39.0–52.0)
HCT: 41.1 % (ref 39.0–52.0)
HCT: 38.2 % — ABNORMAL LOW (ref 39.0–52.0)
Hemoglobin: 14.9 g/dL (ref 13.0–17.0)
Hemoglobin: 14 g/dL (ref 13.0–17.0)
Hemoglobin: 13 g/dL (ref 13.0–17.0)
MCH: 31.1 pg (ref 26.0–34.0)
MCH: 30.9 pg (ref 26.0–34.0)
MCH: 30.7 pg (ref 26.0–34.0)
MCHC: 33.9 g/dL (ref 30.0–36.0)
MCHC: 34.1 g/dL (ref 30.0–36.0)
MCHC: 34 g/dL (ref 30.0–36.0)
MCV: 91.9 fL (ref 78.0–100.0)
MCV: 90.7 fL (ref 78.0–100.0)
MCV: 90.3 fL (ref 78.0–100.0)
PLATELETS: 203 10*3/uL (ref 150–400)
Platelets: 167 10*3/uL (ref 150–400)
Platelets: 169 10*3/uL (ref 150–400)
RBC: 4.79 MIL/uL (ref 4.22–5.81)
RBC: 4.53 MIL/uL (ref 4.22–5.81)
RBC: 4.23 MIL/uL (ref 4.22–5.81)
RDW: 13.1 % (ref 11.5–15.5)
RDW: 13.1 % (ref 11.5–15.5)
RDW: 12.9 % (ref 11.5–15.5)
WBC: 8.2 10*3/uL (ref 4.0–10.5)
WBC: 19.3 10*3/uL — AB (ref 4.0–10.5)
WBC: 19.5 10*3/uL — ABNORMAL HIGH (ref 4.0–10.5)

## 2018-02-17 LAB — BASIC METABOLIC PANEL
Anion gap: 8 (ref 5–15)
Anion gap: 8 (ref 5–15)
BUN: 19 mg/dL (ref 6–20)
BUN: 17 mg/dL (ref 6–20)
CO2: 28 mmol/L (ref 22–32)
CO2: 23 mmol/L (ref 22–32)
Calcium: 9.2 mg/dL (ref 8.9–10.3)
Calcium: 7.8 mg/dL — ABNORMAL LOW (ref 8.9–10.3)
Chloride: 105 mmol/L (ref 101–111)
Chloride: 108 mmol/L (ref 101–111)
Creatinine, Ser: 1.57 mg/dL — ABNORMAL HIGH (ref 0.61–1.24)
Creatinine, Ser: 1.24 mg/dL (ref 0.61–1.24)
GFR calc Af Amer: 53 mL/min — ABNORMAL LOW (ref >60)
GFR calc Af Amer: >60 mL/min (ref >60)
GFR calc non Af Amer: 46 mL/min — ABNORMAL LOW (ref >60)
GFR calc non Af Amer: >60 mL/min (ref >60)
Glucose, Bld: 105 mg/dL — ABNORMAL HIGH (ref 65–99)
Glucose, Bld: 121 mg/dL — ABNORMAL HIGH (ref 65–99)
Potassium: 3.8 mmol/L (ref 3.5–5.1)
Potassium: 4.2 mmol/L (ref 3.5–5.1)
Sodium: 141 mmol/L (ref 135–145)
Sodium: 139 mmol/L (ref 135–145)

## 2018-02-17 LAB — HEMOGLOBIN AND HEMATOCRIT, BLOOD
HCT: 34.9 % — ABNORMAL LOW (ref 39.0–52.0)
Hemoglobin: 12.1 g/dL — ABNORMAL LOW (ref 13.0–17.0)

## 2018-02-17 LAB — GLUCOSE, CAPILLARY
GLUCOSE-CAPILLARY: 125 mg/dL — AB (ref 65–99)
GLUCOSE-CAPILLARY: 100 mg/dL — AB (ref 65–99)
GLUCOSE-CAPILLARY: 142 mg/dL — AB (ref 65–99)
Glucose-Capillary: 113 mg/dL — ABNORMAL HIGH (ref 65–99)
Glucose-Capillary: 127 mg/dL — ABNORMAL HIGH (ref 65–99)
Glucose-Capillary: 133 mg/dL — ABNORMAL HIGH (ref 65–99)
Glucose-Capillary: 120 mg/dL — ABNORMAL HIGH (ref 65–99)
Glucose-Capillary: 118 mg/dL — ABNORMAL HIGH (ref 65–99)

## 2018-02-17 LAB — POCT I-STAT 4, (NA,K, GLUC, HGB,HCT)
Glucose, Bld: 121 mg/dL — ABNORMAL HIGH (ref 65–99)
HCT: 41 % (ref 39.0–52.0)
Hemoglobin: 13.9 g/dL (ref 13.0–17.0)
Potassium: 3.7 mmol/L (ref 3.5–5.1)
SODIUM: 143 mmol/L (ref 135–145)

## 2018-02-17 LAB — APTT: APTT: 27 s (ref 24–36)

## 2018-02-17 LAB — MAGNESIUM: Magnesium: 2.8 mg/dL — ABNORMAL HIGH (ref 1.7–2.4)

## 2018-02-17 LAB — PLATELET COUNT: Platelets: 176 10*3/uL (ref 150–400)

## 2018-02-17 LAB — SURGICAL PCR SCREEN
MRSA, PCR: NEGATIVE
Staphylococcus aureus: NEGATIVE

## 2018-02-17 LAB — PROTIME-INR
INR: 1.18
PROTHROMBIN TIME: 14.9 s (ref 11.4–15.2)

## 2018-02-17 SURGERY — CORONARY ARTERY BYPASS GRAFTING (CABG)
Anesthesia: General | Site: Chest

## 2018-02-17 MED ORDER — NITROGLYCERIN IN D5W 200-5 MCG/ML-% IV SOLN: 0.0000 ug/min | INTRAVENOUS | Status: DC

## 2018-02-17 MED ORDER — FENTANYL CITRATE (PF) 250 MCG/5ML IJ SOLN
INTRAMUSCULAR | Status: DC | PRN
  Administered 2018-02-17: 100 ug via INTRAVENOUS
  Administered 2018-02-17: 50 ug via INTRAVENOUS
  Administered 2018-02-17: 100 ug via INTRAVENOUS
  Administered 2018-02-17: 50 ug via INTRAVENOUS
  Administered 2018-02-17: 650 ug via INTRAVENOUS
  Administered 2018-02-17: 150 ug via INTRAVENOUS
  Administered 2018-02-17: 100 ug via INTRAVENOUS
  Administered 2018-02-17: 150 ug via INTRAVENOUS

## 2018-02-17 MED ORDER — BISACODYL 10 MG RE SUPP: 10.0000 mg | Freq: Every day | RECTAL | Status: DC

## 2018-02-17 MED ORDER — METOPROLOL TARTRATE 5 MG/5ML IV SOLN: 2.5000 mg | INTRAVENOUS | Status: DC | PRN

## 2018-02-17 MED ORDER — ASPIRIN 81 MG PO CHEW: 324.0000 mg | CHEWABLE_TABLET | Freq: Every day | ORAL | Status: DC

## 2018-02-17 MED ORDER — ACETAMINOPHEN 160 MG/5ML PO SOLN: 1000.0000 mg | Freq: Four times a day (QID) | ORAL | Status: DC

## 2018-02-17 MED ORDER — PROPOFOL 10 MG/ML IV BOLUS
INTRAVENOUS | Status: DC | PRN
  Administered 2018-02-17: 50 mg via INTRAVENOUS

## 2018-02-17 MED ORDER — MIDAZOLAM HCL 5 MG/5ML IJ SOLN
INTRAMUSCULAR | Status: DC | PRN
  Administered 2018-02-17: 2 mg via INTRAVENOUS
  Administered 2018-02-17: 3 mg via INTRAVENOUS
  Administered 2018-02-17: 1 mg via INTRAVENOUS
  Administered 2018-02-17 (×2): 2 mg via INTRAVENOUS

## 2018-02-17 MED ORDER — DEXMEDETOMIDINE HCL IN NACL 200 MCG/50ML IV SOLN: 0.0000 ug/kg/h | INTRAVENOUS | Status: DC

## 2018-02-17 MED ORDER — ACETAMINOPHEN 500 MG PO TABS
1000.0000 mg | ORAL_TABLET | Freq: Four times a day (QID) | ORAL | Status: DC
  Administered 2018-02-18 – 2018-02-19 (×6): 1000 mg via ORAL
  Filled 2018-02-17 (×6): qty 2

## 2018-02-17 MED ORDER — METOPROLOL TARTRATE 25 MG/10 ML ORAL SUSPENSION: 12.5000 mg | Freq: Two times a day (BID) | ORAL | Status: DC

## 2018-02-17 MED ORDER — BISACODYL 5 MG PO TBEC
10.0000 mg | DELAYED_RELEASE_TABLET | Freq: Every day | ORAL | Status: DC
  Administered 2018-02-18: 10 mg via ORAL
  Filled 2018-02-17: qty 2

## 2018-02-17 MED ORDER — SODIUM CHLORIDE 0.45 % IV SOLN
INTRAVENOUS | Status: DC | PRN
  Administered 2018-02-17: 15:00:00 via INTRAVENOUS

## 2018-02-17 MED ORDER — LACTATED RINGERS IV SOLN
INTRAVENOUS | Status: DC | PRN
  Administered 2018-02-17 (×2): via INTRAVENOUS

## 2018-02-17 MED ORDER — DOCUSATE SODIUM 100 MG PO CAPS
200.0000 mg | ORAL_CAPSULE | Freq: Every day | ORAL | Status: DC
  Administered 2018-02-18: 200 mg via ORAL
  Filled 2018-02-17: qty 2

## 2018-02-17 MED ORDER — ACETAMINOPHEN 650 MG RE SUPP
650.0000 mg | Freq: Once | RECTAL | Status: AC
  Administered 2018-02-17: 650 mg via RECTAL

## 2018-02-17 MED ORDER — PROPOFOL 10 MG/ML IV BOLUS
INTRAVENOUS | Status: AC
  Filled 2018-02-17: qty 20

## 2018-02-17 MED ORDER — CEFAZOLIN SODIUM-DEXTROSE 2-4 GM/100ML-% IV SOLN
2.0000 g | Freq: Three times a day (TID) | INTRAVENOUS | Status: DC
  Administered 2018-02-17 – 2018-02-19 (×5): 2 g via INTRAVENOUS
  Filled 2018-02-17 (×6): qty 100

## 2018-02-17 MED ORDER — PHENYLEPHRINE 40 MCG/ML (10ML) SYRINGE FOR IV PUSH (FOR BLOOD PRESSURE SUPPORT)
PREFILLED_SYRINGE | INTRAVENOUS | Status: AC
  Filled 2018-02-17: qty 10

## 2018-02-17 MED ORDER — ASPIRIN EC 325 MG PO TBEC
325.0000 mg | DELAYED_RELEASE_TABLET | Freq: Every day | ORAL | Status: DC
  Administered 2018-02-18: 325 mg via ORAL
  Filled 2018-02-17: qty 1

## 2018-02-17 MED ORDER — SODIUM CHLORIDE 0.9 % IV SOLN: 250.0000 mL | INTRAVENOUS | Status: DC

## 2018-02-17 MED ORDER — FENTANYL CITRATE (PF) 250 MCG/5ML IJ SOLN
INTRAMUSCULAR | Status: AC
  Filled 2018-02-17: qty 5

## 2018-02-17 MED ORDER — LACTATED RINGERS IV SOLN: INTRAVENOUS | Status: DC

## 2018-02-17 MED ORDER — METOPROLOL TARTRATE 12.5 MG HALF TABLET
12.5000 mg | ORAL_TABLET | Freq: Two times a day (BID) | ORAL | Status: DC
  Administered 2018-02-18 (×2): 12.5 mg via ORAL
  Filled 2018-02-17 (×2): qty 1

## 2018-02-17 MED ORDER — SODIUM CHLORIDE 0.9 % IV SOLN
0.0000 ug/min | INTRAVENOUS | Status: DC
  Filled 2018-02-17: qty 2

## 2018-02-17 MED ORDER — FAMOTIDINE IN NACL 20-0.9 MG/50ML-% IV SOLN
20.0000 mg | Freq: Two times a day (BID) | INTRAVENOUS | Status: DC
  Administered 2018-02-17: 20 mg via INTRAVENOUS

## 2018-02-17 MED ORDER — LACTATED RINGERS IV SOLN
INTRAVENOUS | Status: DC | PRN
  Administered 2018-02-17: 08:00:00 via INTRAVENOUS

## 2018-02-17 MED ORDER — SODIUM CHLORIDE 0.9 % IV SOLN
INTRAVENOUS | Status: DC | PRN
  Administered 2018-02-17: 750 mg via INTRAVENOUS

## 2018-02-17 MED ORDER — DOPAMINE-DEXTROSE 3.2-5 MG/ML-% IV SOLN: 0.0000 ug/kg/min | INTRAVENOUS | Status: DC

## 2018-02-17 MED ORDER — ROCURONIUM BROMIDE 50 MG/5ML IV SOLN
INTRAVENOUS | Status: AC
  Filled 2018-02-17: qty 4

## 2018-02-17 MED ORDER — TRAMADOL HCL 50 MG PO TABS: 50.0000 mg | ORAL_TABLET | ORAL | Status: DC | PRN

## 2018-02-17 MED ORDER — SODIUM CHLORIDE 0.9 % IV SOLN
INTRAVENOUS | Status: DC
Start: 2018-02-17 — End: 2018-02-19

## 2018-02-17 MED ORDER — EPHEDRINE 5 MG/ML INJ
INTRAVENOUS | Status: AC
  Filled 2018-02-17: qty 10

## 2018-02-17 MED ORDER — SODIUM CHLORIDE 0.9% FLUSH
3.0000 mL | Freq: Two times a day (BID) | INTRAVENOUS | Status: DC
  Administered 2018-02-18 (×2): 3 mL via INTRAVENOUS

## 2018-02-17 MED ORDER — ROCURONIUM BROMIDE 50 MG/5ML IV SOLN
INTRAVENOUS | Status: AC
  Filled 2018-02-17: qty 2

## 2018-02-17 MED ORDER — ALBUMIN HUMAN 5 % IV SOLN
INTRAVENOUS | Status: DC | PRN
  Administered 2018-02-17: 13:00:00 via INTRAVENOUS

## 2018-02-17 MED ORDER — VANCOMYCIN HCL IN DEXTROSE 1-5 GM/200ML-% IV SOLN
1000.0000 mg | Freq: Once | INTRAVENOUS | Status: AC
  Administered 2018-02-17: 1000 mg via INTRAVENOUS
  Filled 2018-02-17: qty 200

## 2018-02-17 MED ORDER — INSULIN REGULAR BOLUS VIA INFUSION
0.0000 [IU] | Freq: Three times a day (TID) | INTRAVENOUS | Status: DC
  Filled 2018-02-17: qty 10

## 2018-02-17 MED ORDER — SODIUM CHLORIDE 0.9% FLUSH: 3.0000 mL | INTRAVENOUS | Status: DC | PRN

## 2018-02-17 MED ORDER — ACETAMINOPHEN 160 MG/5ML PO SOLN: 650.0000 mg | Freq: Once | ORAL | Status: AC

## 2018-02-17 MED ORDER — OXYCODONE HCL 5 MG PO TABS
5.0000 mg | ORAL_TABLET | ORAL | Status: DC | PRN
  Administered 2018-02-18: 5 mg via ORAL
  Administered 2018-02-18 (×2): 10 mg via ORAL
  Filled 2018-02-17 (×2): qty 2
  Filled 2018-02-17: qty 1

## 2018-02-17 MED ORDER — CHLORHEXIDINE GLUCONATE 0.12 % MT SOLN
15.0000 mL | OROMUCOSAL | Status: AC
  Administered 2018-02-17: 15 mL via OROMUCOSAL

## 2018-02-17 MED ORDER — MIDAZOLAM HCL 2 MG/2ML IJ SOLN: 2.0000 mg | INTRAMUSCULAR | Status: DC | PRN

## 2018-02-17 MED ORDER — PANTOPRAZOLE SODIUM 40 MG PO TBEC: 40.0000 mg | DELAYED_RELEASE_TABLET | Freq: Every day | ORAL | Status: DC

## 2018-02-17 MED ORDER — HEMOSTATIC AGENTS (NO CHARGE) OPTIME
TOPICAL | Status: DC | PRN
  Administered 2018-02-17 (×3): 1 via TOPICAL

## 2018-02-17 MED ORDER — LACTATED RINGERS IV SOLN: 500.0000 mL | Freq: Once | INTRAVENOUS | Status: DC | PRN

## 2018-02-17 MED ORDER — MAGNESIUM SULFATE 4 GM/100ML IV SOLN
4.0000 g | Freq: Once | INTRAVENOUS | Status: AC
  Administered 2018-02-17: 4 g via INTRAVENOUS
  Filled 2018-02-17: qty 100

## 2018-02-17 MED ORDER — FENTANYL CITRATE (PF) 250 MCG/5ML IJ SOLN
INTRAMUSCULAR | Status: AC
  Filled 2018-02-17: qty 25

## 2018-02-17 MED ORDER — POTASSIUM CHLORIDE 10 MEQ/50ML IV SOLN
10.0000 meq | INTRAVENOUS | Status: AC
  Administered 2018-02-17 (×3): 10 meq via INTRAVENOUS

## 2018-02-17 MED ORDER — HEPARIN SODIUM (PORCINE) 1000 UNIT/ML IJ SOLN
INTRAMUSCULAR | Status: DC | PRN
  Administered 2018-02-17: 27000 [IU] via INTRAVENOUS

## 2018-02-17 MED ORDER — ONDANSETRON HCL 4 MG/2ML IJ SOLN
4.0000 mg | Freq: Four times a day (QID) | INTRAMUSCULAR | Status: DC | PRN
  Administered 2018-02-17 – 2018-02-18 (×2): 4 mg via INTRAVENOUS
  Filled 2018-02-17 (×2): qty 2

## 2018-02-17 MED ORDER — MORPHINE SULFATE (PF) 4 MG/ML IV SOLN
1.0000 mg | INTRAVENOUS | Status: DC | PRN
  Administered 2018-02-17: 2 mg via INTRAVENOUS

## 2018-02-17 MED ORDER — ALBUMIN HUMAN 5 % IV SOLN
250.0000 mL | INTRAVENOUS | Status: AC | PRN
  Administered 2018-02-17 (×2): 250 mL via INTRAVENOUS

## 2018-02-17 MED ORDER — LACTATED RINGERS IV SOLN
INTRAVENOUS | Status: DC
  Administered 2018-02-17 – 2018-02-18 (×2): via INTRAVENOUS

## 2018-02-17 MED ORDER — SODIUM CHLORIDE 0.9 % IJ SOLN
OROMUCOSAL | Status: DC | PRN
  Administered 2018-02-17 (×3): 4 mL via TOPICAL

## 2018-02-17 MED ORDER — SODIUM CHLORIDE 0.9 % IV SOLN
INTRAVENOUS | Status: DC
  Filled 2018-02-17: qty 1

## 2018-02-17 MED ORDER — PROTAMINE SULFATE 10 MG/ML IV SOLN
INTRAVENOUS | Status: AC
  Filled 2018-02-17: qty 25

## 2018-02-17 MED ORDER — MIDAZOLAM HCL 10 MG/2ML IJ SOLN
INTRAMUSCULAR | Status: AC
Start: 2018-02-17 — End: 2018-02-17
  Filled 2018-02-17: qty 2

## 2018-02-17 MED ORDER — PROTAMINE SULFATE 10 MG/ML IV SOLN
INTRAVENOUS | Status: DC | PRN
  Administered 2018-02-17: 250 mg via INTRAVENOUS

## 2018-02-17 MED ORDER — HEPARIN SODIUM (PORCINE) 1000 UNIT/ML IJ SOLN
INTRAMUSCULAR | Status: AC
  Filled 2018-02-17: qty 1

## 2018-02-17 MED ORDER — PROTAMINE SULFATE 10 MG/ML IV SOLN
INTRAVENOUS | Status: AC
  Filled 2018-02-17: qty 5

## 2018-02-17 MED ORDER — EPHEDRINE SULFATE-NACL 50-0.9 MG/10ML-% IV SOSY
PREFILLED_SYRINGE | INTRAVENOUS | Status: DC | PRN
  Administered 2018-02-17: 10 mg via INTRAVENOUS

## 2018-02-17 MED ORDER — 0.9 % SODIUM CHLORIDE (POUR BTL) OPTIME
TOPICAL | Status: DC | PRN
  Administered 2018-02-17: 1000 mL
  Administered 2018-02-17: 5000 mL

## 2018-02-17 MED ORDER — ROCURONIUM BROMIDE 100 MG/10ML IV SOLN
INTRAVENOUS | Status: DC | PRN
  Administered 2018-02-17: 30 mg via INTRAVENOUS
  Administered 2018-02-17 (×4): 50 mg via INTRAVENOUS
  Administered 2018-02-17: 70 mg via INTRAVENOUS

## 2018-02-17 MED ORDER — MORPHINE SULFATE (PF) 4 MG/ML IV SOLN
2.0000 mg | INTRAVENOUS | Status: DC | PRN
  Filled 2018-02-17: qty 1

## 2018-02-17 SURGICAL SUPPLY — 74 items
BAG DECANTER FOR FLEXI CONT (MISCELLANEOUS) ×3 IMPLANT
BANDAGE ACE 4X5 VEL STRL LF (GAUZE/BANDAGES/DRESSINGS) ×3 IMPLANT
BANDAGE ACE 6X5 VEL STRL LF (GAUZE/BANDAGES/DRESSINGS) ×3 IMPLANT
BLADE 11 SAFETY STRL DISP (BLADE) ×3 IMPLANT
BLADE STERNUM SYSTEM 6 (BLADE) ×3 IMPLANT
BNDG GAUZE ELAST 4 BULKY (GAUZE/BANDAGES/DRESSINGS) ×3 IMPLANT
CANISTER SUCT 3000ML PPV (MISCELLANEOUS) ×3 IMPLANT
CATH CPB KIT GERHARDT (MISCELLANEOUS) ×3 IMPLANT
CATH THORACIC 28FR (CATHETERS) ×3 IMPLANT
CLIP VESOCCLUDE SM WIDE 24/CT (CLIP) ×3 IMPLANT
CONN ST 1/4X3/8  BEN (MISCELLANEOUS) ×1
CONN ST 1/4X3/8 BEN (MISCELLANEOUS) ×2 IMPLANT
CRADLE DONUT ADULT HEAD (MISCELLANEOUS) ×3 IMPLANT
DERMABOND ADVANCED (GAUZE/BANDAGES/DRESSINGS) ×1
DERMABOND ADVANCED .7 DNX12 (GAUZE/BANDAGES/DRESSINGS) ×2 IMPLANT
DRAIN CHANNEL 28F RND 3/8 FF (WOUND CARE) ×3 IMPLANT
DRAPE CARDIOVASCULAR INCISE (DRAPES) ×1
DRAPE SLUSH/WARMER DISC (DRAPES) ×3 IMPLANT
DRAPE SRG 135X102X78XABS (DRAPES) ×2 IMPLANT
DRSG AQUACEL AG ADV 3.5X14 (GAUZE/BANDAGES/DRESSINGS) ×3 IMPLANT
ELECT BLADE 4.0 EZ CLEAN MEGAD (MISCELLANEOUS) ×3
ELECT CAUTERY BLADE 6.4 (BLADE) ×3 IMPLANT
ELECT REM PT RETURN 9FT ADLT (ELECTROSURGICAL) ×6
ELECTRODE BLDE 4.0 EZ CLN MEGD (MISCELLANEOUS) ×2 IMPLANT
ELECTRODE REM PT RTRN 9FT ADLT (ELECTROSURGICAL) ×4 IMPLANT
FELT TEFLON 1X6 (MISCELLANEOUS) ×3 IMPLANT
FLOSEAL 10ML (HEMOSTASIS) ×3 IMPLANT
GAUZE SPONGE 4X4 12PLY STRL (GAUZE/BANDAGES/DRESSINGS) ×6 IMPLANT
GLOVE BIO SURGEON STRL SZ 6 (GLOVE) ×6 IMPLANT
GLOVE BIO SURGEON STRL SZ 6.5 (GLOVE) ×15 IMPLANT
GLOVE BIOGEL PI IND STRL 6.5 (GLOVE) ×4 IMPLANT
GLOVE BIOGEL PI INDICATOR 6.5 (GLOVE) ×2
GOWN STRL REUS W/ TWL LRG LVL3 (GOWN DISPOSABLE) ×16 IMPLANT
GOWN STRL REUS W/TWL LRG LVL3 (GOWN DISPOSABLE) ×8
HEMOSTAT POWDER SURGIFOAM 1G (HEMOSTASIS) ×9 IMPLANT
HEMOSTAT SURGICEL 2X14 (HEMOSTASIS) ×6 IMPLANT
KIT BASIN OR (CUSTOM PROCEDURE TRAY) ×3 IMPLANT
KIT CATH SUCT 8FR (CATHETERS) ×3 IMPLANT
KIT ROOM TURNOVER OR (KITS) ×3 IMPLANT
KIT SUCTION CATH 14FR (SUCTIONS) ×6 IMPLANT
KIT VASOVIEW HEMOPRO VH 3000 (KITS) ×3 IMPLANT
LEAD PACING MYOCARDI (MISCELLANEOUS) ×3 IMPLANT
MARKER GRAFT CORONARY BYPASS (MISCELLANEOUS) ×9 IMPLANT
NS IRRIG 1000ML POUR BTL (IV SOLUTION) ×18 IMPLANT
PACK E OPEN HEART (SUTURE) ×3 IMPLANT
PACK OPEN HEART (CUSTOM PROCEDURE TRAY) ×3 IMPLANT
PAD ARMBOARD 7.5X6 YLW CONV (MISCELLANEOUS) ×6 IMPLANT
PAD ELECT DEFIB RADIOL ZOLL (MISCELLANEOUS) ×3 IMPLANT
PENCIL BUTTON HOLSTER BLD 10FT (ELECTRODE) ×3 IMPLANT
PUNCH AORTIC ROTATE  4.5MM 8IN (MISCELLANEOUS) ×3 IMPLANT
SET CARDIOPLEGIA MPS 5001102 (MISCELLANEOUS) ×3 IMPLANT
SOLUTION ANTI FOG 6CC (MISCELLANEOUS) ×3 IMPLANT
SPONGE LAP 18X18 X RAY DECT (DISPOSABLE) ×3 IMPLANT
SUT BONE WAX W31G (SUTURE) ×3 IMPLANT
SUT MNCRL AB 4-0 PS2 18 (SUTURE) ×3 IMPLANT
SUT PROLENE 3 0 SH1 36 (SUTURE) ×3 IMPLANT
SUT PROLENE 4 0 TF (SUTURE) ×6 IMPLANT
SUT PROLENE 6 0 CC (SUTURE) ×18 IMPLANT
SUT PROLENE 7 0 BV1 MDA (SUTURE) ×6 IMPLANT
SUT PROLENE 8 0 BV175 6 (SUTURE) ×12 IMPLANT
SUT STEEL 6MS V (SUTURE) ×3 IMPLANT
SUT STEEL SZ 6 DBL 3X14 BALL (SUTURE) ×3 IMPLANT
SUT VIC AB 1 CTX 18 (SUTURE) ×6 IMPLANT
SUT VIC AB 2-0 CT1 27 (SUTURE) ×1
SUT VIC AB 2-0 CT1 TAPERPNT 27 (SUTURE) ×2 IMPLANT
SYSTEM SAHARA CHEST DRAIN ATS (WOUND CARE) ×3 IMPLANT
TAPE CLOTH SURG 4X10 WHT LF (GAUZE/BANDAGES/DRESSINGS) ×3 IMPLANT
TAPE PAPER 2X10 WHT MICROPORE (GAUZE/BANDAGES/DRESSINGS) ×3 IMPLANT
TOWEL GREEN STERILE (TOWEL DISPOSABLE) ×3 IMPLANT
TOWEL GREEN STERILE FF (TOWEL DISPOSABLE) ×3 IMPLANT
TRAY FOLEY SILVER 16FR TEMP (SET/KITS/TRAYS/PACK) ×3 IMPLANT
TUBING INSUFFLATION (TUBING) ×3 IMPLANT
UNDERPAD 30X30 (UNDERPADS AND DIAPERS) ×3 IMPLANT
WATER STERILE IRR 1000ML POUR (IV SOLUTION) ×6 IMPLANT

## 2018-02-17 NOTE — Progress Notes (Signed)
CT surgery p.m. Rounds  Patient extubated this evening after multivessel CABG Patient feels fairly comfortable working on incentive spirometry Hemodynamic stable

## 2018-02-17 NOTE — Brief Op Note (Signed)
      [address]       [place] [address]             [phone]      02/17/2018  2:04 PM  PATIENT:  Todd Buck  62 y.o. male  PRE-OPERATIVE DIAGNOSIS:  CAD  POST-OPERATIVE DIAGNOSIS:  CAD  PROCEDURE:  Procedure(s):  CORONARY ARTERY BYPASS GRAFTING x 4 -LIMA to LAD -SVG to DIAGONAL -SVG to RAMUS INTERMEDIATE -SVG to RCA  ENDOSCOPIC HARVEST GREATER SAPHENOUS VEIN -Right Leg  TRANSESOPHAGEAL ECHOCARDIOGRAM (TEE) (N/A)  SURGEON:  Surgeon(s) and Role:    * Grace Isaac, MD - Primary  PHYSICIAN ASSISTANT: Ellwood Handler PA-C  ANESTHESIA:   general  EBL:  925 mL   BLOOD ADMINISTERED: CELLSAVER  DRAINS: Left Pleural Chest Tubes, Mediastinal Chest Tubes foley, sg, a line   COUNTS:  YES   DICTATION: .Dragon Dictation  PLAN OF CARE: Admit to inpatient   PATIENT DISPOSITION:  ICU - intubated and hemodynamically stable.   Delay start of Pharmacological VTE agent (>24hrs) due to surgical blood loss or risk of bleeding: yes

## 2018-02-17 NOTE — Procedures (Signed)
Extubation Procedure Note  Patient Details:   Name: Todd Buck DOB: 1956/09/10 MRN: [id number]   Airway Documentation:  Airway 8 mm (Active)  Secured at (cm) 23 cm 02/17/2018  2:25 AM  Measured From Teeth 02/17/2018  2:25 AM  Secured Location Right 02/17/2018  2:25 AM  Secured By Pink Tape 02/17/2018  2:25 AM  Cuff Pressure (cm H2O) 25 cm H2O 02/17/2018  2:25 AM  Site Condition Dry 02/17/2018  2:25 AM    Evaluation  O2 sats: stable throughout Complications: No apparent complications Patient did tolerate procedure well. Bilateral Breath Sounds: Clear   Yes  Placed on 4l/min [place] Incentive spirometer performed and achieved 2.0L  Revonda Standard 02/17/2018, 6:00 PM

## 2018-02-17 NOTE — Anesthesia Procedure Notes (Signed)
Procedure Name: Intubation Date/Time: 02/17/2018 8:51 AM Performed by: Shirlyn Goltz, CRNA Pre-anesthesia Checklist: Patient identified, Emergency Drugs available, Suction available and Patient being monitored Patient Re-evaluated:Patient Re-evaluated prior to induction Oxygen Delivery Method: Circle system utilized Preoxygenation: Pre-oxygenation with 100% oxygen Induction Type: IV induction Ventilation: Mask ventilation without difficulty and Oral airway inserted - appropriate to patient size Laryngoscope Size: Mac and 4 Grade View: Grade I Tube type: Oral Tube size: 8.0 mm Number of attempts: 1 Airway Equipment and Method: Stylet Placement Confirmation: ETT inserted through vocal cords under direct vision,  positive ETCO2 and breath sounds checked- equal and bilateral Secured at: 21 cm Tube secured with: Tape Dental Injury: Teeth and Oropharynx as per pre-operative assessment

## 2018-02-17 NOTE — Anesthesia Procedure Notes (Signed)
Central Venous Catheter Insertion Performed by: Nolon Nations, MD, anesthesiologist Start/End3/05/2018 8:10 AM, 02/17/2018 8:20 AM Patient location: Pre-op. Preanesthetic checklist: patient identified, IV checked, site marked, risks and benefits discussed, surgical consent, monitors and equipment checked, pre-op evaluation, timeout performed and anesthesia consent Lidocaine 1% used for infiltration and patient sedated Hand hygiene performed  and maximum sterile barriers used  Catheter size: 8.5 Fr Sheath introducer Procedure performed using ultrasound guided technique. Ultrasound Notes:anatomy identified, needle tip was noted to be adjacent to the nerve/plexus identified, no ultrasound evidence of intravascular and/or intraneural injection and image(s) printed for medical record Attempts: 1 Following insertion, line sutured, dressing applied and Biopatch. Post procedure assessment: blood return through all ports, free fluid flow and no air  Patient tolerated the procedure well with no immediate complications.

## 2018-02-17 NOTE — Anesthesia Procedure Notes (Signed)
Central Venous Catheter Insertion Performed by: Nolon Nations, MD, anesthesiologist Patient location: Pre-op. Preanesthetic checklist: patient identified, IV checked, site marked, risks and benefits discussed, surgical consent, monitors and equipment checked, pre-op evaluation, timeout performed and anesthesia consent Hand hygiene performed  and maximum sterile barriers used  PA cath was placed.Swan type:thermodilution PA Cath depth:45 Procedure performed without using ultrasound guided technique. Attempts: 1 Patient tolerated the procedure well with no immediate complications.

## 2018-02-17 NOTE — Transfer of Care (Signed)
Immediate Anesthesia Transfer of Care Note  Patient: Todd Buck  Procedure(s) Performed: CORONARY ARTERY BYPASS GRAFTING (CABG) x 4 WITH ENDOSCOPIC HARVESTING OF RIGHT SAPHENOUS VEIN. LIMA TO LAD, SVG TO DISTAL RCA, SVG TO DIAGONAL, SVG TO INTERMEDIUS (N/A Chest) TRANSESOPHAGEAL ECHOCARDIOGRAM (TEE) (N/A )  Patient Location: ICU  Anesthesia Type:General  Level of Consciousness: sedated and Patient remains intubated per anesthesia plan  Airway & Oxygen Therapy: Patient remains intubated per anesthesia plan and Patient placed on Ventilator (see vital sign flow sheet for setting)  Post-op Assessment: Report given to RN and Post -op Vital signs reviewed and stable  112/69 abp; 96  Hr 99% spo2  Post vital signs: Reviewed and stable  Last Vitals:  Vitals:   02/17/18 0400 02/17/18 0552  BP: 122/73   Pulse: (!) 51 88  Resp: 18   Temp: 36.6 C   SpO2: 97%     Last Pain:  Vitals:   02/17/18 0400  TempSrc: Oral  PainSc:          Complications: No apparent anesthesia complications

## 2018-02-17 NOTE — Anesthesia Procedure Notes (Signed)
Arterial Line Insertion Start/End3/05/2018 7:30 AM, 02/17/2018 7:35 AM Performed by: Shirlyn Goltz, CRNA  Patient location: Pre-op. Preanesthetic checklist: patient identified, IV checked, site marked, risks and benefits discussed, surgical consent, monitors and equipment checked, pre-op evaluation and anesthesia consent Lidocaine 1% used for infiltration and patient sedated Left, radial was placed Catheter size: 20 G Hand hygiene performed  and maximum sterile barriers used  Allen's test indicative of satisfactory collateral circulation Attempts: 1 Procedure performed without using ultrasound guided technique. Ultrasound Notes:anatomy identified, needle tip was noted to be adjacent to the nerve/plexus identified and no ultrasound evidence of intravascular and/or intraneural injection Following insertion, Biopatch and dressing applied. Post procedure assessment: normal  Patient tolerated the procedure well with no immediate complications.

## 2018-02-17 NOTE — Anesthesia Postprocedure Evaluation (Signed)
Anesthesia Post Note  Patient: Todd Buck  Procedure(s) Performed: CORONARY ARTERY BYPASS GRAFTING (CABG) x 4 WITH ENDOSCOPIC HARVESTING OF RIGHT SAPHENOUS VEIN. LIMA TO LAD, SVG TO DISTAL RCA, SVG TO DIAGONAL, SVG TO INTERMEDIUS (N/A Chest) TRANSESOPHAGEAL ECHOCARDIOGRAM (TEE) (N/A )     Patient location during evaluation: SICU Anesthesia Type: General Level of consciousness: sedated Pain management: pain level controlled Vital Signs Assessment: post-procedure vital signs reviewed and stable Respiratory status: patient remains intubated per anesthesia plan and patient on ventilator - see flowsheet for VS Cardiovascular status: stable Postop Assessment: no apparent nausea or vomiting Anesthetic complications: no    Last Vitals:  Vitals:   02/17/18 1430 02/17/18 1445  BP:    Pulse: 95   Resp: 15 16  Temp: (!) 36.3 C (!) 36.2 C  SpO2: 95% 93%    Last Pain:  Vitals:   02/17/18 1430  TempSrc: Core (Comment)  PainSc:                  Lai Hendriks,W. EDMOND

## 2018-02-17 NOTE — Progress Notes (Signed)
  Echocardiogram Echocardiogram Transesophageal has been performed.  Todd Buck M 02/17/2018, 9:25 AM

## 2018-02-18 ENCOUNTER — Encounter (HOSPITAL_COMMUNITY): Payer: Self-pay | Admitting: Cardiothoracic Surgery

## 2018-02-18 ENCOUNTER — Inpatient Hospital Stay (HOSPITAL_COMMUNITY): Payer: BLUE CROSS/BLUE SHIELD

## 2018-02-18 LAB — CBC
HCT: 38.9 % — ABNORMAL LOW (ref 39.0–52.0)
HCT: 37 % — ABNORMAL LOW (ref 39.0–52.0)
Hemoglobin: 13 g/dL (ref 13.0–17.0)
Hemoglobin: 12.2 g/dL — ABNORMAL LOW (ref 13.0–17.0)
MCH: 30.2 pg (ref 26.0–34.0)
MCH: 30.4 pg (ref 26.0–34.0)
MCHC: 33.4 g/dL (ref 30.0–36.0)
MCHC: 33 g/dL (ref 30.0–36.0)
MCV: 90.5 fL (ref 78.0–100.0)
MCV: 92.3 fL (ref 78.0–100.0)
PLATELETS: 173 10*3/uL (ref 150–400)
Platelets: 166 10*3/uL (ref 150–400)
RBC: 4.3 MIL/uL (ref 4.22–5.81)
RBC: 4.01 MIL/uL — ABNORMAL LOW (ref 4.22–5.81)
RDW: 13 % (ref 11.5–15.5)
RDW: 13.2 % (ref 11.5–15.5)
WBC: 16.5 10*3/uL — ABNORMAL HIGH (ref 4.0–10.5)
WBC: 14.1 10*3/uL — ABNORMAL HIGH (ref 4.0–10.5)

## 2018-02-18 LAB — POCT I-STAT, CHEM 8
BUN: 23 mg/dL — AB (ref 6–20)
CREATININE: 1.6 mg/dL — AB (ref 0.61–1.24)
Calcium, Ion: 1.14 mmol/L — ABNORMAL LOW (ref 1.15–1.40)
Chloride: 99 mmol/L — ABNORMAL LOW (ref 101–111)
GLUCOSE: 164 mg/dL — AB (ref 65–99)
HEMATOCRIT: 36 % — AB (ref 39.0–52.0)
HEMOGLOBIN: 12.2 g/dL — AB (ref 13.0–17.0)
Potassium: 3.8 mmol/L (ref 3.5–5.1)
Sodium: 137 mmol/L (ref 135–145)
TCO2: 26 mmol/L (ref 22–32)

## 2018-02-18 LAB — BASIC METABOLIC PANEL
Anion gap: 9 (ref 5–15)
BUN: 17 mg/dL (ref 6–20)
CO2: 24 mmol/L (ref 22–32)
Calcium: 8 mg/dL — ABNORMAL LOW (ref 8.9–10.3)
Chloride: 105 mmol/L (ref 101–111)
Creatinine, Ser: 1.22 mg/dL (ref 0.61–1.24)
GFR calc Af Amer: >60 mL/min (ref >60)
Glucose, Bld: 109 mg/dL — ABNORMAL HIGH (ref 65–99)
POTASSIUM: 3.9 mmol/L (ref 3.5–5.1)
Sodium: 138 mmol/L (ref 135–145)

## 2018-02-18 LAB — MAGNESIUM
MAGNESIUM: 2.2 mg/dL (ref 1.7–2.4)
Magnesium: 2.3 mg/dL (ref 1.7–2.4)

## 2018-02-18 LAB — GLUCOSE, CAPILLARY
GLUCOSE-CAPILLARY: 100 mg/dL — AB (ref 65–99)
GLUCOSE-CAPILLARY: 109 mg/dL — AB (ref 65–99)
GLUCOSE-CAPILLARY: 114 mg/dL — AB (ref 65–99)
GLUCOSE-CAPILLARY: 118 mg/dL — AB (ref 65–99)
GLUCOSE-CAPILLARY: 121 mg/dL — AB (ref 65–99)
GLUCOSE-CAPILLARY: 162 mg/dL — AB (ref 65–99)
Glucose-Capillary: 101 mg/dL — ABNORMAL HIGH (ref 65–99)
Glucose-Capillary: 101 mg/dL — ABNORMAL HIGH (ref 65–99)
Glucose-Capillary: 113 mg/dL — ABNORMAL HIGH (ref 65–99)
Glucose-Capillary: 116 mg/dL — ABNORMAL HIGH (ref 65–99)
Glucose-Capillary: 117 mg/dL — ABNORMAL HIGH (ref 65–99)
Glucose-Capillary: 122 mg/dL — ABNORMAL HIGH (ref 65–99)
Glucose-Capillary: 130 mg/dL — ABNORMAL HIGH (ref 65–99)

## 2018-02-18 LAB — CREATININE, SERUM
CREATININE: 1.69 mg/dL — AB (ref 0.61–1.24)
GFR calc Af Amer: 49 mL/min — ABNORMAL LOW (ref >60)
GFR calc non Af Amer: 42 mL/min — ABNORMAL LOW (ref >60)

## 2018-02-18 MED ORDER — ENOXAPARIN SODIUM 40 MG/0.4ML ~~LOC~~ SOLN
40.0000 mg | Freq: Every day | SUBCUTANEOUS | Status: DC
  Administered 2018-02-18 – 2018-02-20 (×3): 40 mg via SUBCUTANEOUS
  Filled 2018-02-18 (×3): qty 0.4

## 2018-02-18 MED ORDER — INSULIN ASPART 100 UNIT/ML ~~LOC~~ SOLN
0.0000 [IU] | SUBCUTANEOUS | Status: DC
  Administered 2018-02-18: 4 [IU] via SUBCUTANEOUS
  Administered 2018-02-18 – 2018-02-19 (×3): 2 [IU] via SUBCUTANEOUS

## 2018-02-18 MED ORDER — PROMETHAZINE HCL 25 MG/ML IJ SOLN
12.5000 mg | Freq: Four times a day (QID) | INTRAMUSCULAR | Status: DC | PRN
  Administered 2018-02-18: 12.5 mg via INTRAVENOUS
  Filled 2018-02-18: qty 1

## 2018-02-18 MED ORDER — METOCLOPRAMIDE HCL 5 MG/ML IJ SOLN
10.0000 mg | Freq: Four times a day (QID) | INTRAMUSCULAR | Status: AC
  Administered 2018-02-18 – 2018-02-19 (×4): 10 mg via INTRAVENOUS
  Filled 2018-02-18 (×4): qty 2

## 2018-02-18 MED FILL — Heparin Sodium (Porcine) Inj 1000 Unit/ML: INTRAMUSCULAR | Qty: 30 | Status: AC

## 2018-02-18 MED FILL — Potassium Chloride Inj 2 mEq/ML: INTRAVENOUS | Qty: 40 | Status: AC

## 2018-02-18 MED FILL — Magnesium Sulfate Inj 50%: INTRAMUSCULAR | Qty: 10 | Status: AC

## 2018-02-18 NOTE — Progress Notes (Addendum)
[facility] DAILY ICU PROGRESS NOTE                   [address]            [place] [address]          [phone]   1 Day Post-Op Procedure(s) (LRB): CORONARY ARTERY BYPASS GRAFTING (CABG) x 4 WITH ENDOSCOPIC HARVESTING OF RIGHT SAPHENOUS VEIN. LIMA TO LAD, SVG TO DISTAL RCA, SVG TO DIAGONAL, SVG TO INTERMEDIUS (N/A) TRANSESOPHAGEAL ECHOCARDIOGRAM (TEE) (N/A)  Total Length of Stay:  LOS: 3 days   Subjective:  Patient with persistent nausea and hot flashes this morning.  Pain is okay, but pain medications are attributing to nausea  Objective: Vital signs in last 24 hours: Temp:  [97 F (36.1 C)-100 F (37.8 C)] 99.9 F (37.7 C) (03/07 0700) Pulse Rate:  [75-95] 75 (03/06 1800) Cardiac Rhythm: Normal sinus rhythm (03/07 0000) Resp:  [8-21] 12 (03/07 0700) BP: (101-130)/(69-87) 118/74 (03/07 0700) SpO2:  [93 %-100 %] 97 % (03/07 0700) Arterial Line BP: (79-198)/(57-149) 92/63 (03/07 0700) FiO2 (%):  [40 %-50 %] 40 % (03/06 1653) Weight:  [195 lb 12.3 oz (88.8 kg)] 195 lb 12.3 oz (88.8 kg) (03/07 0600)  Filed Weights   02/16/18 0521 02/17/18 0400 02/18/18 0600  Weight: 194 lb 9.6 oz (88.3 kg) 194 lb 8 oz (88.2 kg) 195 lb 12.3 oz (88.8 kg)    Weight change: 1 lb 4.3 oz (0.576 kg)   Hemodynamic parameters for last 24 hours: PAP: (20-30)/(8-17) 22/10 CO:  [3.9 L/min-5.4 L/min] 5.1 L/min CI:  [1.9 L/min/m2-2.7 L/min/m2] 2.6 L/min/m2  Intake/Output from previous day: 03/06 0701 - 03/07 0700 In: 5115.2 [I.V.:2895.2; Blood:740; NG/GT:30; IV Piggyback:1450] Out: 6063 [Urine:4775; Blood:925; Chest Tube:360]  Current Meds: Scheduled Meds: . acetaminophen  1,000 mg Oral Q6H   Or  . acetaminophen (TYLENOL) oral liquid 160 mg/5 mL  1,000 mg Per Tube Q6H  . aspirin EC  325 mg Oral Daily   Or  . aspirin  324 mg Per Tube Daily  . atorvastatin  40 mg Oral q1800  . bisacodyl  10 mg Oral Daily   Or  . bisacodyl  10 mg Rectal Daily  . docusate sodium  200 mg Oral  Daily  . enoxaparin (LOVENOX) injection  40 mg Subcutaneous QHS  . insulin aspart  0-24 Units Subcutaneous Q4H  . insulin regular  0-10 Units Intravenous TID WC  . metoCLOPramide (REGLAN) injection  10 mg Intravenous Q6H  . metoprolol tartrate  12.5 mg Oral BID   Or  . metoprolol tartrate  12.5 mg Per Tube BID  . [START ON 02/19/2018] pantoprazole  40 mg Oral Daily  . sodium chloride flush  3 mL Intravenous Q12H   Continuous Infusions: . sodium chloride 20 mL/hr at 02/17/18 1500  . sodium chloride    . sodium chloride    . albumin human    .  ceFAZolin (ANCEF) IV 2 g (02/18/18 0812)  . dexmedetomidine (PRECEDEX) IV infusion Stopped (02/17/18 1703)  . DOPamine 3 mcg/kg/min (02/17/18 1511)  . famotidine (PEPCID) IV Stopped (02/17/18 1528)  . insulin (NOVOLIN-R) infusion 2.4 Units/hr (02/18/18 0800)  . lactated ringers    . lactated ringers    . lactated ringers 20 mL/hr at 02/17/18 1546  . nitroGLYCERIN    . phenylephrine (NEO-SYNEPHRINE) Adult infusion Stopped (02/18/18 0400)   PRN Meds:.sodium chloride, albumin human, lactated ringers, metoprolol tartrate, midazolam, morphine injection, ondansetron (ZOFRAN) IV, oxyCODONE, promethazine,  sodium chloride flush, traMADol  General appearance: alert, cooperative and no distress Heart: regular rate and rhythm Lungs: clear to auscultation bilaterally Abdomen: soft, mild distention,  minimal to no BS  Extremities: edema trace Wound: clean and dry, aquacel in place on sternum  Lab Results: CBC: Recent Labs    02/17/18 2029 02/18/18 0411  WBC 19.5* 16.5*  HGB 13.0 13.0  HCT 38.2* 38.9*  PLT 169 173   BMET:  Recent Labs    02/17/18 2029 02/18/18 0411  NA 139 138  K 4.2 3.9  CL 108 105  CO2 23 24  GLUCOSE 121* 109*  BUN 17 17  CREATININE 1.24 1.22  CALCIUM 7.8* 8.0*    CMET: Lab Results  Component Value Date   WBC 16.5 (H) 02/18/2018   HGB 13.0 02/18/2018   HCT 38.9 (L) 02/18/2018   PLT 173 02/18/2018   GLUCOSE  109 (H) 02/18/2018   CHOL 239 (H) 02/15/2018   TRIG 249 (H) 02/15/2018   HDL 46 02/15/2018   LDLCALC 143 (H) 02/15/2018   ALT 19 02/16/2018   AST 20 02/16/2018   NA 138 02/18/2018   K 3.9 02/18/2018   CL 105 02/18/2018   CREATININE 1.22 02/18/2018   BUN 17 02/18/2018   CO2 24 02/18/2018   TSH 1.188 02/15/2018   INR 1.18 02/17/2018   HGBA1C 5.5 02/16/2018      PT/INR:  Recent Labs    02/17/18 1435  LABPROT 14.9  INR 1.18   Radiology: Dg Chest Port 1 View  Result Date: 02/17/2018 CLINICAL DATA:  Coronary artery disease.  CABG. EXAM: PORTABLE CHEST 1 VIEW COMPARISON:  02/15/2018 FINDINGS: Endotracheal tube, NG tube, chest tubes, and Swan-Ganz catheter all appear in good position. No pneumothorax. Minimal linear atelectasis bilaterally. No effusions. Vascularity is normal. Heart size is normal. IMPRESSION: Minimal bilateral atelectasis. Electronically Signed   By: Lorriane Shire M.D.   On: 02/17/2018 15:39     Assessment/Plan: S/P Procedure(s) (LRB): CORONARY ARTERY BYPASS GRAFTING (CABG) x 4 WITH ENDOSCOPIC HARVESTING OF RIGHT SAPHENOUS VEIN. LIMA TO LAD, SVG TO DISTAL RCA, SVG TO DIAGONAL, SVG TO INTERMEDIUS (N/A) TRANSESOPHAGEAL ECHOCARDIOGRAM (TEE) (N/A)  1. CV- NSR, off Dopamine- will start low dose BB 2. Pulm- wean oxygen as tolerated, CXR with some mild right diaphragm elevation, no signficant effusions 3. Renal- creatinine WNL, weight is only 1 lb above baseline, no Lasix at this time 4. GI- Nausea, not much relief with Zofran, minimal BS, will add Reglan for 4 doses and phenergan prn 5. Expected post operative blood loss anemia, mild Hgb 13.0 6. CBGs- controlled, stop insulin drip, start SSIP 7. Dispo- patient with post operative nausea, regimen adjusted, off Dopamine, POD #1 progression orders     Ellwood Handler 02/18/2018 8:25 AM   Patient awake and alert Neuro intact Nausea this am, ? From pain meds  Cr stable and uop excellent I have seen and examined Richard Miu and agree with the above assessment  and plan.  Grace Isaac MD [facility] [phone] Office [phone] 02/18/2018 8:41 AM

## 2018-02-18 NOTE — Progress Notes (Signed)
[facility] BRIEF SICU PROGRESS NOTE  1 Day Post-Op  S/P Procedure(s) (LRB): CORONARY ARTERY BYPASS GRAFTING (CABG) x 4 WITH ENDOSCOPIC HARVESTING OF RIGHT SAPHENOUS VEIN. LIMA TO LAD, SVG TO DISTAL RCA, SVG TO DIAGONAL, SVG TO INTERMEDIUS (N/A) TRANSESOPHAGEAL ECHOCARDIOGRAM (TEE) (N/A)   Stable day NSR w/ stable BP Breathing comfortably on 4 L/min UOP adequate  Plan: Continue current plan  Rexene Alberts, MD 02/18/2018 7:28 PM

## 2018-02-19 ENCOUNTER — Inpatient Hospital Stay (HOSPITAL_COMMUNITY): Payer: BLUE CROSS/BLUE SHIELD

## 2018-02-19 LAB — BASIC METABOLIC PANEL
Anion gap: 8 (ref 5–15)
BUN: 24 mg/dL — ABNORMAL HIGH (ref 6–20)
CALCIUM: 8 mg/dL — AB (ref 8.9–10.3)
CHLORIDE: 104 mmol/L (ref 101–111)
CO2: 27 mmol/L (ref 22–32)
CREATININE: 1.41 mg/dL — AB (ref 0.61–1.24)
GFR calc non Af Amer: 52 mL/min — ABNORMAL LOW (ref >60)
Glucose, Bld: 124 mg/dL — ABNORMAL HIGH (ref 65–99)
Potassium: 3.8 mmol/L (ref 3.5–5.1)
SODIUM: 139 mmol/L (ref 135–145)

## 2018-02-19 LAB — GLUCOSE, CAPILLARY
GLUCOSE-CAPILLARY: 128 mg/dL — AB (ref 65–99)
GLUCOSE-CAPILLARY: 144 mg/dL — AB (ref 65–99)
Glucose-Capillary: 115 mg/dL — ABNORMAL HIGH (ref 65–99)
Glucose-Capillary: 125 mg/dL — ABNORMAL HIGH (ref 65–99)
Glucose-Capillary: 100 mg/dL — ABNORMAL HIGH (ref 65–99)
Glucose-Capillary: 145 mg/dL — ABNORMAL HIGH (ref 65–99)

## 2018-02-19 LAB — CBC
HCT: 34.2 % — ABNORMAL LOW (ref 39.0–52.0)
Hemoglobin: 11.2 g/dL — ABNORMAL LOW (ref 13.0–17.0)
MCH: 30.1 pg (ref 26.0–34.0)
MCHC: 32.7 g/dL (ref 30.0–36.0)
MCV: 91.9 fL (ref 78.0–100.0)
PLATELETS: 139 10*3/uL — AB (ref 150–400)
RBC: 3.72 MIL/uL — AB (ref 4.22–5.81)
RDW: 13.1 % (ref 11.5–15.5)
WBC: 12.4 10*3/uL — AB (ref 4.0–10.5)

## 2018-02-19 MED ORDER — TRAMADOL HCL 50 MG PO TABS: 50.0000 mg | ORAL_TABLET | ORAL | Status: DC | PRN

## 2018-02-19 MED ORDER — SODIUM CHLORIDE 0.9% FLUSH
3.0000 mL | Freq: Two times a day (BID) | INTRAVENOUS | Status: DC
  Administered 2018-02-19 – 2018-02-20 (×3): 3 mL via INTRAVENOUS

## 2018-02-19 MED ORDER — MOVING RIGHT ALONG BOOK
Freq: Once | Status: AC
  Administered 2018-02-19: 17:00:00
  Filled 2018-02-19: qty 1

## 2018-02-19 MED ORDER — ASPIRIN EC 325 MG PO TBEC
325.0000 mg | DELAYED_RELEASE_TABLET | Freq: Every day | ORAL | Status: DC
  Administered 2018-02-19 – 2018-02-21 (×3): 325 mg via ORAL
  Filled 2018-02-19 (×3): qty 1

## 2018-02-19 MED ORDER — BISACODYL 10 MG RE SUPP: 10.0000 mg | Freq: Every day | RECTAL | Status: DC | PRN

## 2018-02-19 MED ORDER — ONDANSETRON HCL 4 MG PO TABS: 4.0000 mg | ORAL_TABLET | Freq: Four times a day (QID) | ORAL | Status: DC | PRN

## 2018-02-19 MED ORDER — METOPROLOL TARTRATE 12.5 MG HALF TABLET
12.5000 mg | ORAL_TABLET | Freq: Two times a day (BID) | ORAL | Status: DC
  Administered 2018-02-19 (×2): 12.5 mg via ORAL
  Filled 2018-02-19 (×2): qty 1

## 2018-02-19 MED ORDER — BISACODYL 5 MG PO TBEC: 10.0000 mg | DELAYED_RELEASE_TABLET | Freq: Every day | ORAL | Status: DC | PRN

## 2018-02-19 MED ORDER — ONDANSETRON HCL 4 MG/2ML IJ SOLN: 4.0000 mg | Freq: Four times a day (QID) | INTRAMUSCULAR | Status: DC | PRN

## 2018-02-19 MED ORDER — SODIUM CHLORIDE 0.9 % IV SOLN: 250.0000 mL | INTRAVENOUS | Status: DC | PRN

## 2018-02-19 MED ORDER — INSULIN ASPART 100 UNIT/ML ~~LOC~~ SOLN
0.0000 [IU] | Freq: Three times a day (TID) | SUBCUTANEOUS | Status: DC
  Administered 2018-02-19 – 2018-02-20 (×3): 2 [IU] via SUBCUTANEOUS

## 2018-02-19 MED ORDER — ACETAMINOPHEN 325 MG PO TABS: 650.0000 mg | ORAL_TABLET | Freq: Four times a day (QID) | ORAL | Status: DC | PRN

## 2018-02-19 MED ORDER — SODIUM CHLORIDE 0.9% FLUSH: 3.0000 mL | INTRAVENOUS | Status: DC | PRN

## 2018-02-19 MED ORDER — PANTOPRAZOLE SODIUM 40 MG PO TBEC
40.0000 mg | DELAYED_RELEASE_TABLET | Freq: Every day | ORAL | Status: DC
  Administered 2018-02-19 – 2018-02-21 (×3): 40 mg via ORAL
  Filled 2018-02-19 (×3): qty 1

## 2018-02-19 NOTE — Progress Notes (Signed)
Patient ID: Todd Buck, male   DOB: 1956-06-23, 62 y.o.   MRN: [id number] [facility] DAILY ICU PROGRESS NOTE                   [address]            [place] [address]          [phone]   2 Days Post-Op Procedure(s) (LRB): CORONARY ARTERY BYPASS GRAFTING (CABG) x 4 WITH ENDOSCOPIC HARVESTING OF RIGHT SAPHENOUS VEIN. LIMA TO LAD, SVG TO DISTAL RCA, SVG TO DIAGONAL, SVG TO INTERMEDIUS (N/A) TRANSESOPHAGEAL ECHOCARDIOGRAM (TEE) (N/A)  Total Length of Stay:  LOS: 4 days   Subjective: Nausea has resolved, walked twice around the unit this morning  Objective: Vital signs in last 24 hours: Temp:  [97.7 F (36.5 C)-99.9 F (37.7 C)] 99.6 F (37.6 C) (03/08 0400) Cardiac Rhythm: Normal sinus rhythm (03/07 2000) Resp:  [4-19] 17 (03/08 0500) BP: (100-128)/(68-88) 118/74 (03/08 0500) SpO2:  [90 %-100 %] 99 % (03/08 0500) Arterial Line BP: (92-132)/(63-72) 132/72 (03/07 0800) Weight:  [197 lb 12.8 oz (89.7 kg)] 197 lb 12.8 oz (89.7 kg) (03/08 0500)  Filed Weights   02/17/18 0400 02/18/18 0600 02/19/18 0500  Weight: 194 lb 8 oz (88.2 kg) 195 lb 12.3 oz (88.8 kg) 197 lb 12.8 oz (89.7 kg)    Weight change: 2 lb 0.5 oz (0.921 kg)   Hemodynamic parameters for last 24 hours: PAP: (22-27)/(10-17) 27/17  Intake/Output from previous day: 03/07 0701 - 03/08 0700 In: 1104.2 [P.O.:480; I.V.:524.2; IV Piggyback:100] Out: 2250 [Urine:2170; Chest Tube:80]  Intake/Output this shift: Total I/O In: 280 [P.O.:240; I.V.:40] Out: 1080 [Urine:1080]  Current Meds: Scheduled Meds: . acetaminophen  1,000 mg Oral Q6H   Or  . acetaminophen (TYLENOL) oral liquid 160 mg/5 mL  1,000 mg Per Tube Q6H  . aspirin EC  325 mg Oral Daily   Or  . aspirin  324 mg Per Tube Daily  . atorvastatin  40 mg Oral q1800  . bisacodyl  10 mg Oral Daily   Or  . bisacodyl  10 mg Rectal Daily  . docusate sodium  200 mg Oral Daily  . enoxaparin (LOVENOX) injection  40 mg Subcutaneous QHS  . insulin  aspart  0-24 Units Subcutaneous Q4H  . metoprolol tartrate  12.5 mg Oral BID   Or  . metoprolol tartrate  12.5 mg Per Tube BID  . pantoprazole  40 mg Oral Daily  . sodium chloride flush  3 mL Intravenous Q12H   Continuous Infusions: . sodium chloride Stopped (02/18/18 1600)  . sodium chloride    . sodium chloride    .  ceFAZolin (ANCEF) IV Stopped (02/19/18 0105)  . dexmedetomidine (PRECEDEX) IV infusion Stopped (02/17/18 1703)  . DOPamine 3 mcg/kg/min (02/17/18 1511)  . lactated ringers    . lactated ringers    . lactated ringers 20 mL/hr at 02/18/18 1900  . nitroGLYCERIN    . phenylephrine (NEO-SYNEPHRINE) Adult infusion Stopped (02/18/18 0400)   PRN Meds:.sodium chloride, lactated ringers, metoprolol tartrate, midazolam, morphine injection, ondansetron (ZOFRAN) IV, oxyCODONE, promethazine, sodium chloride flush, traMADol  General appearance: alert, cooperative and no distress Neurologic: intact Heart: regular rate and rhythm, S1, S2 normal, no murmur, click, rub or gallop Lungs: clear to auscultation bilaterally Abdomen: soft, non-tender; bowel sounds normal; no masses,  no organomegaly Extremities: extremities normal, atraumatic, no cyanosis or edema and Homans sign is negative, no sign of DVT Wound: Sternum stable dressing intact  Lab Results: CBC: Recent Labs    02/18/18 1656 02/19/18 0445  WBC 14.1* 12.4*  HGB 12.2* 11.2*  HCT 37.0* 34.2*  PLT 166 139*   BMET:  Recent Labs    02/18/18 0411 02/18/18 1655 02/18/18 1656 02/19/18 0445  NA 138 137  --  139  K 3.9 3.8  --  3.8  CL 105 99*  --  104  CO2 24  --   --  27  GLUCOSE 109* 164*  --  124*  BUN 17 23*  --  24*  CREATININE 1.22 1.60* 1.69* 1.41*  CALCIUM 8.0*  --   --  8.0*    CMET: Lab Results  Component Value Date   WBC 12.4 (H) 02/19/2018   HGB 11.2 (L) 02/19/2018   HCT 34.2 (L) 02/19/2018   PLT 139 (L) 02/19/2018   GLUCOSE 124 (H) 02/19/2018   CHOL 239 (H) 02/15/2018   TRIG 249 (H)  02/15/2018   HDL 46 02/15/2018   LDLCALC 143 (H) 02/15/2018   ALT 19 02/16/2018   AST 20 02/16/2018   NA 139 02/19/2018   K 3.8 02/19/2018   CL 104 02/19/2018   CREATININE 1.41 (H) 02/19/2018   BUN 24 (H) 02/19/2018   CO2 27 02/19/2018   TSH 1.188 02/15/2018   INR 1.18 02/17/2018   HGBA1C 5.5 02/16/2018      PT/INR:  Recent Labs    02/17/18 1435  LABPROT 14.9  INR 1.18   Radiology: No results found.  Chronic Kidney Disease   Stage I     GFR >90  Stage II    GFR 60-89  Stage IIIA GFR 45-59  Stage IIIB GFR 30-44  Stage IV   GFR 15-29  Stage V    GFR  <15  Lab Results  Component Value Date   CREATININE 1.41 (H) 02/19/2018   Estimated Creatinine Clearance: 59.8 mL/min (A) (by C-G formula based on SCr of 1.41 mg/dL (H)).  Assessment/Plan: S/P Procedure(s) (LRB): CORONARY ARTERY BYPASS GRAFTING (CABG) x 4 WITH ENDOSCOPIC HARVESTING OF RIGHT SAPHENOUS VEIN. LIMA TO LAD, SVG TO DISTAL RCA, SVG TO DIAGONAL, SVG TO INTERMEDIUS (N/A) TRANSESOPHAGEAL ECHOCARDIOGRAM (TEE) (N/A) Mobilize Plan for transfer to step-down: see transfer orders Renal function stable-at baseline creatinine 1.4-preop stage IIIa chronic kidney disease avoid nonsteroidal anti-inflammatories Expected blood loss anemia Possible discharge March 10, patient has home support after discharge  Grace Isaac 02/19/2018 6:35 AM

## 2018-02-19 NOTE — Progress Notes (Signed)
Patient ID: Todd Buck, male   DOB: Oct 10, 1956, 62 y.o.   MRN: [id number] T[facility] evening rounds:  Hemodynamically stable Good urine output Waiting for bed on 4E

## 2018-02-19 NOTE — Plan of Care (Signed)
  Progressing Activity: Risk for activity intolerance will decrease 02/19/2018 1857 - Progressing by Jenne Campus, RN Cardiac: Hemodynamic stability will improve 02/19/2018 1857 - Progressing by Jenne Campus, RN Clinical Measurements: Postoperative complications will be avoided or minimized 02/19/2018 1857 - Progressing by Jenne Campus, RN Respiratory: Respiratory status will improve 02/19/2018 1857 - Progressing by Jenne Campus, RN Skin Integrity: Wound healing without signs and symptoms of infection 02/19/2018 1857 - Progressing by Jenne Campus, RN

## 2018-02-19 NOTE — Discharge Summary (Signed)
Physician Discharge Summary  Patient ID: Todd Buck MRN: [id number] DOB/AGE: 07-14-1956 62 y.o.  Admit date: 02/15/2018 Discharge date: 02/21/2018  Admission Diagnoses:  Patient Active Problem List   Diagnosis Date Noted  . CAD (coronary artery disease), native coronary artery 02/16/2018  . Unstable angina ([place]) 02/15/2018  . Hyperlipidemia 02/15/2018  . Kidney disease, chronic, stage III (GFR 30-59 ml/min) ([place]) 02/15/2018  . Obesity (BMI 30-39.9) 02/15/2018   Discharge Diagnoses:   Patient Active Problem List   Diagnosis Date Noted  . S/P CABG x 4 02/17/2018  . CAD (coronary artery disease), native coronary artery 02/16/2018  . Unstable angina ([place]) 02/15/2018  . Hyperlipidemia 02/15/2018  . Kidney disease, chronic, stage III (GFR 30-59 ml/min) ([place]) 02/15/2018  . Obesity (BMI 30-39.9) 02/15/2018   Discharged Condition: good  History of Present Illness:  Todd Buck is a 62 yo male with history of hyperlipidemia, CKD Stage 3, diabetes, and obesity.  He presented with a 1 month complaint of exertional chest discomfort which he described as burning in nature.  The patient noticed his symptoms were exacerbated if he tried to be active after eating a large meal.  The episodes started occurring more frequently and with a lower intensity level of exertion.  He presented to his primary cary physician who started the patient on a PPI.  He was later evaluated by Dr. Ezekiel Slocumb office on 02/15/2018.  EKG obtained during that visit showed significant T wave inversions in the inferior and anterior leads.  It was felt the patient should be admitted to the hospital for further evaluation.  Hospital Course:   He was chest pain free on hospital admission.  He was started on IV hydration prior to his catheterization to help with dye load during catheterization.  He was taken to the catheterization lab on 02/16/2018 by Dr. Ellyn Hack and found to have a preserved EF and multivessel CAD with multivessel  CAD.  It was felt coronary bypass grafting would be indicated and TCTS consult was obtained.  He was evaluated by Dr. Servando Snare who was in agreement with proceeding with coronary bypass grafting procedure.  The risks and benefits of the procedure were explained to the patient and he was agreeable to proceed.  The patient was taken to the operating room on 02/17/2018.  He underwent CABG x 4 utilizing LIMA to LAD, SVG to Diagonal, SVG to Ramus Intermediate, and SVG to RCA.  He also underwent endoscopic harvest greater saphenous vein from right leg.  He tolerated the procedure without difficulty and was taken to the SICU in stable condition.  He was extubated the evening of surgery.  During his stay in the SICU the patient was weaned off Dopamine as tolerated.  His chest tubes and arterial lines were removed without difficulty.  He was having issues with intense post operative nausea.  He was treated with zofran, reglan, and phenergan with relief of symptoms.  He has baseline CKD Stage 3.  His creatinine level was 1.4 preoperatively and remained stable during his hospitalization.  He was maintaining NSR.  He was ambulating in the SICU in stable condition.  He was medically stable for transfer to telemetry unit on 3/**/2019.  He continued to make progress.  He continued to maintain NSR.  His pacing wires were removed prior to discharge.  He continues to ambulate without difficulty.  His incisions are healing without evidence of infection.  He is tolerating a diabetic diet.  He is medically stable for discharge home today.  Significant Diagnostic Studies: angiography:    Mid RCA lesion is 99% stenosed. RPDA lesion is 100% stenosed.  Lat Ramus-1 lesion is 60% stenosed. Lat Ramus-2 lesion is 80% stenosed.  Ost 1st Diag-1 lesion is 90% stenosed. Ost 1st Diag-2 lesion is 70% stenosed.  Prox LAD lesion is 95% stenosed. Mid LAD lesion is 70% stenosed. Mid LAD to Dist LAD lesion is 100% stenosed.  Ost Cx lesion is  99% stenosed. Prox Cx to Mid Cx lesion is 45% stenosed.  The left ventricular systolic function is normal. The left ventricular ejection fraction is 50-55% by visual estimate.  LV end diastolic pressure is normal.   Severe multivessel disease with extensive disease in the LAD and major diagonal branch as well as the first major branch of the large ramus intermedius. The ostial circumflex as well as mid RCA also have severe disease.  Treatments: surgery:   CORONARY ARTERY BYPASS GRAFTING x 4 -LIMA to LAD -SVG to DIAGONAL -SVG to RAMUS INTERMEDIATE -SVG to RCA  ENDOSCOPIC HARVEST GREATER SAPHENOUS VEIN -Right Leg  TRANSESOPHAGEAL ECHOCARDIOGRAM (TEE) (N/A)  Disposition: Home  Discharge Medications:  The patient has been discharged on:   1.Beta Blocker:  Yes [  x ]                              No   [   ]                              If No, reason:  2.Ace Inhibitor/ARB: Yes [   ]                                     No  [  x  ]                                     If No, reason: titration of BB  3.Statin:   Yes [ x  ]                  No  [   ]                  If No, reason:  4.Ecasa:  Yes  [ x  ]                  No   [   ]                  If No, reason:     Discharge Instructions    Amb Referral to Cardiac Rehabilitation   Complete by:  As directed    Diagnosis:  CABG   CABG X ___:  4     Allergies as of 02/21/2018   No Known Allergies     Medication List    TAKE these medications   acetaminophen 325 MG tablet Commonly known as:  TYLENOL Take 2 tablets (650 mg total) by mouth every 6 (six) hours as needed for mild pain.   aspirin 325 MG EC tablet Take 1 tablet (325 mg total) by mouth daily.   atorvastatin 40 MG tablet Commonly known as:  LIPITOR Take 1 tablet (40 mg total) by mouth daily at 6  PM.   metoprolol tartrate 25 MG tablet Commonly known as:  LOPRESSOR Take 1 tablet (25 mg total) by mouth 2 (two) times daily.   omeprazole 20 MG  capsule Commonly known as:  PRILOSEC Take 20 mg by mouth daily.   traMADol 50 MG tablet Commonly known as:  ULTRAM Take 1 tablet (50 mg total) by mouth every 6 (six) hours as needed for moderate pain.      Follow-up Information    Jacolyn Reedy, MD Follow up on 03/01/2018.   Specialty:  Cardiology Why:  Appointment is at 10:15 Contact information: [address] [place] [address] [phone]        [facility] Follow up on 03/22/2018.   Specialty:  Cardiothoracic Surgery Why:  Appointment is at 2:00, please get CXR at 1:30 at [facility] located on first floor of our office building Contact information: [place], [place]       Shirline Frees, MD. Call in 1 day(s).   Specialty:  Family Medicine Contact information: [place] [address] [phone]           Signed: Elgie Collard 02/21/2018, 8:18 AM

## 2018-02-19 NOTE — Progress Notes (Signed)
Report called to 4E. Jarrett Soho RN given report. Pt made aware of transfer. Will continue to monitor until transferred.

## 2018-02-19 NOTE — Care Management Note (Signed)
Case Management Note  Patient Details  Name: Todd Buck MRN: [id number] Date of Birth: 03-Apr-1956  Subjective/Objective:    Pt is s/p CABG                 Action/Plan:  PTA independent from home.  Pt has PCP and denied barriers to obtaining and paying for medications at discharge.  Pt states family will provide 24 hour supervision at discharge   Expected Discharge Date:                  Expected Discharge Plan:  Home/Self Care  In-House Referral:     Discharge planning Services  CM Consult  Post Acute Care Choice:    Choice offered to:     DME Arranged:    DME Agency:     HH Arranged:    HH Agency:     Status of Service:     If discussed at [employer], dates discussed:    Additional Comments:  Maryclare Labrador, RN 02/19/2018, 3:05 PM

## 2018-02-20 ENCOUNTER — Inpatient Hospital Stay (HOSPITAL_COMMUNITY): Payer: BLUE CROSS/BLUE SHIELD

## 2018-02-20 DIAGNOSIS — I200 Unstable angina: Secondary | ICD-10-CM

## 2018-02-20 LAB — BASIC METABOLIC PANEL
Anion gap: 11 (ref 5–15)
BUN: 20 mg/dL (ref 6–20)
CO2: 26 mmol/L (ref 22–32)
Calcium: 8.3 mg/dL — ABNORMAL LOW (ref 8.9–10.3)
Chloride: 101 mmol/L (ref 101–111)
Creatinine, Ser: 1.31 mg/dL — ABNORMAL HIGH (ref 0.61–1.24)
GFR calc Af Amer: >60 mL/min (ref >60)
GFR calc non Af Amer: 57 mL/min — ABNORMAL LOW (ref >60)
Glucose, Bld: 138 mg/dL — ABNORMAL HIGH (ref 65–99)
Potassium: 4.2 mmol/L (ref 3.5–5.1)
Sodium: 138 mmol/L (ref 135–145)

## 2018-02-20 LAB — CBC
HCT: 34 % — ABNORMAL LOW (ref 39.0–52.0)
Hemoglobin: 11.3 g/dL — ABNORMAL LOW (ref 13.0–17.0)
MCH: 30.7 pg (ref 26.0–34.0)
MCHC: 33.2 g/dL (ref 30.0–36.0)
MCV: 92.4 fL (ref 78.0–100.0)
Platelets: 168 10*3/uL (ref 150–400)
RBC: 3.68 MIL/uL — ABNORMAL LOW (ref 4.22–5.81)
RDW: 12.9 % (ref 11.5–15.5)
WBC: 12.3 10*3/uL — ABNORMAL HIGH (ref 4.0–10.5)

## 2018-02-20 LAB — GLUCOSE, CAPILLARY
GLUCOSE-CAPILLARY: 116 mg/dL — AB (ref 65–99)
Glucose-Capillary: 150 mg/dL — ABNORMAL HIGH (ref 65–99)
Glucose-Capillary: 92 mg/dL (ref 65–99)
Glucose-Capillary: 81 mg/dL (ref 65–99)

## 2018-02-20 MED ORDER — METOPROLOL TARTRATE 25 MG PO TABS
25.0000 mg | ORAL_TABLET | Freq: Two times a day (BID) | ORAL | Status: DC
  Administered 2018-02-20 – 2018-02-21 (×3): 25 mg via ORAL
  Filled 2018-02-20 (×3): qty 1

## 2018-02-20 NOTE — Progress Notes (Addendum)
      [address]       [place] [address]             [phone]      3 Days Post-Op Procedure(s) (LRB): CORONARY ARTERY BYPASS GRAFTING (CABG) x 4 WITH ENDOSCOPIC HARVESTING OF RIGHT SAPHENOUS VEIN. LIMA TO LAD, SVG TO DISTAL RCA, SVG TO DIAGONAL, SVG TO INTERMEDIUS (N/A) TRANSESOPHAGEAL ECHOCARDIOGRAM (TEE) (N/A) Subjective: Feels good this morning. Eating breakfast.   Objective: Vital signs in last 24 hours: Temp:  [98.8 F (37.1 C)-100.2 F (37.9 C)] 98.8 F (37.1 C) (03/09 0314) Pulse Rate:  [96-98] 96 (03/08 2222) Cardiac Rhythm: Normal sinus rhythm (03/09 0701) Resp:  [16-27] 20 (03/08 2031) BP: (0-155)/(0-91) 112/75 (03/09 0314) SpO2:  [94 %-100 %] 100 % (03/08 2031) Weight:  [195 lb 3.2 oz (88.5 kg)] 195 lb 3.2 oz (88.5 kg) (03/09 0314)     Intake/Output from previous day: 03/08 0701 - 03/09 0700 In: 300 [P.O.:300] Out: 2300 [Urine:2300] Intake/Output this shift: No intake/output data recorded.  General appearance: alert, cooperative and no distress Heart: regular rate and rhythm, S1, S2 normal, no murmur, click, rub or gallop Lungs: clear to auscultation bilaterally Abdomen: soft, non-tender; bowel sounds normal; no masses,  no organomegaly Extremities: extremities normal, atraumatic, no cyanosis or edema Wound: clean and dry  Lab Results: Recent Labs    02/19/18 0445 02/20/18 0247  WBC 12.4* 12.3*  HGB 11.2* 11.3*  HCT 34.2* 34.0*  PLT 139* 168   BMET:  Recent Labs    02/19/18 0445 02/20/18 0247  NA 139 138  K 3.8 4.2  CL 104 101  CO2 27 26  GLUCOSE 124* 138*  BUN 24* 20  CREATININE 1.41* 1.31*  CALCIUM 8.0* 8.3*    PT/INR:  Recent Labs    02/17/18 1435  LABPROT 14.9  INR 1.18   ABG    Component Value Date/Time   PHART 7.388 02/17/2018 1846   HCO3 26.4 02/17/2018 1846   TCO2 26 02/18/2018 1655   ACIDBASEDEF 1.0 02/17/2018 1732   O2SAT 99.0 02/17/2018 1846   CBG (last 3)  Recent Labs    02/19/18 0823  02/19/18 1205 02/19/18 2124  GLUCAP 115* 100* 145*    Assessment/Plan: S/P Procedure(s) (LRB): CORONARY ARTERY BYPASS GRAFTING (CABG) x 4 WITH ENDOSCOPIC HARVESTING OF RIGHT SAPHENOUS VEIN. LIMA TO LAD, SVG TO DISTAL RCA, SVG TO DIAGONAL, SVG TO INTERMEDIUS (N/A) TRANSESOPHAGEAL ECHOCARDIOGRAM (TEE) (N/A)  1. CV-NSR rate in the 90s-1002. BP well controlled. Continue ASA and statin. On Metoprolol 12.5mg  BID.  Will increase Metoprolol today and then remove EPW.  2. Pulm-tolerating room air with excellent oxygen saturation. Encouraged incentive spirometer.  3. Renal-creatinine is 1.3 which is trending down. Electrolytes okay. Making good urine and weight is trending down. Not on Lasix. Avoid nephrotoxic agents.  4. H and H stable, platelets are 168k 5. Endo-blood glucose well controlled.   Plan: Increase Metoprolol and remove EPW a few hours after. Increase Ambulation. Possibly home tomorrow.    LOS: 5 days    Elgie Collard 02/20/2018   Chart reviewed, patient examined, agree with above. He looks great, walking well, 2500 on IS Plan home tomorrow if no changes.

## 2018-02-20 NOTE — Progress Notes (Signed)
Patient arrived to the floor with RN via wheelchair.  Pt is A&Ox4.  Pt walked to the chair with no complications. Vitals stable. Will continue to monitor pt.

## 2018-02-20 NOTE — Progress Notes (Signed)
CARDIAC REHAB PHASE I   PRE:  Rate/Rhythm: 111 sinus tachycardia, freq PAC  BP:  Supine:   Sitting: 120/83  Standing:    SaO2: 97%ra  MODE:  Ambulation: 710 ft   POST:  Rate/Rhythem: 120 sinus tach, freq PAC  BP:  Supine:   Sitting: 138/89  Standing:    SaO2: 98% ra  0910-1005 Pt ambulated in hallway x1 assist, tolerating light walking without diffculty.  Asymptomatic. Pt returned to chair, call light in reach, family at bedside.  Education completed including sternal precautions, risk factor modification, low fat-low cholesterol diet, exercise, and medication compliance.  Pt oriented to outpatient cardiac rehab.  At pt request, referral will be sent to [facility] [facility].  Understanding verbalized   [employer]

## 2018-02-20 NOTE — Progress Notes (Signed)
Pacing wires removed per order. Patient instructed on bed rest and vital signs monitoring initiated. 

## 2018-02-21 LAB — GLUCOSE, CAPILLARY: Glucose-Capillary: 106 mg/dL — ABNORMAL HIGH (ref 65–99)

## 2018-02-21 MED ORDER — ACETAMINOPHEN 325 MG PO TABS: 650.0000 mg | ORAL_TABLET | Freq: Four times a day (QID) | ORAL | Status: DC | PRN

## 2018-02-21 MED ORDER — ATORVASTATIN CALCIUM 40 MG PO TABS: 40.0000 mg | ORAL_TABLET | Freq: Every day | ORAL | 1 refills | Status: DC

## 2018-02-21 MED ORDER — ASPIRIN 325 MG PO TBEC: 325.0000 mg | DELAYED_RELEASE_TABLET | Freq: Every day | ORAL | 0 refills | Status: DC

## 2018-02-21 MED ORDER — TRAMADOL HCL 50 MG PO TABS: 50.0000 mg | ORAL_TABLET | Freq: Four times a day (QID) | ORAL | 0 refills | Status: DC | PRN

## 2018-02-21 MED ORDER — METOPROLOL TARTRATE 25 MG PO TABS: 25.0000 mg | ORAL_TABLET | Freq: Two times a day (BID) | ORAL | 1 refills | Status: DC

## 2018-02-21 NOTE — Progress Notes (Signed)
      [address]       [place], [address]             [phone]      4 Days Post-Op Procedure(s) (LRB): CORONARY ARTERY BYPASS GRAFTING (CABG) x 4 WITH ENDOSCOPIC HARVESTING OF RIGHT SAPHENOUS VEIN. LIMA TO LAD, SVG TO DISTAL RCA, SVG TO DIAGONAL, SVG TO INTERMEDIUS (N/A) TRANSESOPHAGEAL ECHOCARDIOGRAM (TEE) (N/A) Subjective: Feels good this morning. Walking in the halls.   Objective: Vital signs in last 24 hours: Temp:  [98.6 F (37 C)-99 F (37.2 C)] 98.6 F (37 C) (03/10 0413) Pulse Rate:  [88-99] 88 (03/10 0413) Cardiac Rhythm: Normal sinus rhythm;Bundle branch block (03/10 0700) Resp:  [14-23] 14 (03/10 0413) BP: (108-127)/(72-84) 127/82 (03/10 0413) SpO2:  [94 %-95 %] 95 % (03/10 0413) Weight:  [193 lb 6.4 oz (87.7 kg)] 193 lb 6.4 oz (87.7 kg) (03/10 0413)     Intake/Output from previous day: No intake/output data recorded. Intake/Output this shift: No intake/output data recorded.  General appearance: alert, cooperative and no distress Heart: regular rate and rhythm, S1, S2 normal, no murmur, click, rub or gallop Lungs: clear to auscultation bilaterally Abdomen: soft, non-tender; bowel sounds normal; no masses,  no organomegaly Extremities: extremities normal, atraumatic, no cyanosis or edema Wound: clean and dry  Lab Results: Recent Labs    02/19/18 0445 02/20/18 0247  WBC 12.4* 12.3*  HGB 11.2* 11.3*  HCT 34.2* 34.0*  PLT 139* 168   BMET:  Recent Labs    02/19/18 0445 02/20/18 0247  NA 139 138  K 3.8 4.2  CL 104 101  CO2 27 26  GLUCOSE 124* 138*  BUN 24* 20  CREATININE 1.41* 1.31*  CALCIUM 8.0* 8.3*    PT/INR: No results for input(s): LABPROT, INR in the last 72 hours. ABG    Component Value Date/Time   PHART 7.388 02/17/2018 1846   HCO3 26.4 02/17/2018 1846   TCO2 26 02/18/2018 1655   ACIDBASEDEF 1.0 02/17/2018 1732   O2SAT 99.0 02/17/2018 1846   CBG (last 3)  Recent Labs    02/20/18 1609 02/20/18 2105  02/21/18 0617  GLUCAP 81 116* 106*    Assessment/Plan: S/P Procedure(s) (LRB): CORONARY ARTERY BYPASS GRAFTING (CABG) x 4 WITH ENDOSCOPIC HARVESTING OF RIGHT SAPHENOUS VEIN. LIMA TO LAD, SVG TO DISTAL RCA, SVG TO DIAGONAL, SVG TO INTERMEDIUS (N/A) TRANSESOPHAGEAL ECHOCARDIOGRAM (TEE) (N/A)  1. CV-NSR rate in the 90s. BP well controlled. Continue ASA and statin. On Metoprolol 25mg  BID.  EPW removed. 2. Pulm-tolerating room air with excellent oxygen saturation. Encouraged incentive spirometer.  3. Renal-creatinine is 1.3 which is trending down. Electrolytes okay. Making good urine and weight is trending down. Not on Lasix. Avoid nephrotoxic agents.  4. H and H stable, platelets are 168k 5. Endo-blood glucose well controlled.   Plan: home today   LOS: 6 days    Elgie Collard 02/21/2018

## 2018-02-21 NOTE — Discharge Instructions (Signed)
Discharge Instructions:  1. You may shower, please wash incisions daily with soap and water and keep dry.  If you wish to cover wounds with dressing you may do so but please keep clean and change daily.  No tub baths or swimming until incisions have completely healed.  If your incisions become red or develop any drainage please call our office at [phone]  2. No Driving until cleared by [facility]'s office and you are no longer using narcotic pain medications  3. Monitor your weight daily.. Please use the same scale and weigh at same time... If you gain 5-10 lbs in 48 hours with associated lower extremity swelling, please contact our office at [phone]  4. Fever of 101.5 for at least 24 hours with no source, please contact our office at [phone]      Coronary Artery Bypass Grafting, Care After This sheet gives you information about how to care for yourself after your procedure. Your health care provider may also give you more specific instructions. If you have problems or questions, contact your health care provider. What can I expect after the procedure? After the procedure, it is common to have:  Nausea and a lack of appetite.  Constipation.  Weakness and fatigue.  Depression or irritability.  Pain or discomfort in your incision areas.  Follow these instructions at home: Medicines  Take over-the-counter and prescription medicines only as told by your health care provider. Do not stop taking medicines or start any new medicines without approval from your health care provider.  If you were prescribed an antibiotic medicine, take it as told by your health care provider. Do not stop taking the antibiotic even if you start to feel better.  Do not drive or use heavy machinery while taking prescription pain medicine. Incision care  Follow instructions from your health care provider about how to take care of your incisions. Make sure you: ? Wash your hands with soap and  water before you change your bandage (dressing). If soap and water are not available, use hand sanitizer. ? Change your dressing as told by your health care provider. ? Leave stitches (sutures), skin glue, or adhesive strips in place. These skin closures may need to stay in place for 2 weeks or longer. If adhesive strip edges start to loosen and curl up, you may trim the loose edges. Do not remove adhesive strips completely unless your health care provider tells you to do that.  Keep incision areas clean, dry, and protected.  Check your incision areas every day for signs of infection. Check for: ? More redness, swelling, or pain. ? More fluid or blood. ? Warmth. ? Pus or a bad smell.  If incisions were made in your legs: ? Avoid crossing your legs. ? Avoid sitting for long periods of time. Change positions every 30 minutes. ? Raise (elevate) your legs when you are sitting. Bathing  Do not take baths, swim, or use a hot tub until your health care provider approves.  Only take sponge baths. Pat the incisions dry. Do not rub incisions with a washcloth or towel.  Ask your health care provider when you can shower. Eating and drinking  Eat foods that are high in fiber, such as raw fruits and vegetables, whole grains, beans, and nuts. Meats should be lean cut. Avoid canned, processed, and fried foods. This can help prevent constipation and is a recommended part of a heart-healthy diet.  Drink enough fluid to keep your urine clear or pale yellow.  Limit alcohol intake to no more than 1 drink a day for nonpregnant women and 2 drinks a day for men. One drink equals 12 oz of beer, 5 oz of wine, or 1 oz of hard liquor. Activity  Rest and limit your activity as told by your health care provider. You may be instructed to: ? Stop any activity right away if you have chest pain, shortness of breath, irregular heartbeats, or dizziness. Get help right away if you have any of these symptoms. ? Move  around frequently for short periods or take short walks as directed by your health care provider. Gradually increase your activities. You may need physical therapy or cardiac rehabilitation to help strengthen your muscles and build your endurance. ? Avoid lifting, pushing, or pulling anything that is heavier than 10 lb (4.5 kg) for at least 6 weeks or as told by your health care provider.  Do not drive until your health care provider approves.  Ask your health care provider when you may return to work.  Ask your health care provider when you may resume sexual activity. General instructions  Do not use any products that contain nicotine or tobacco, such as cigarettes and e-cigarettes. If you need help quitting, ask your health care provider.  Take 2-3 deep breaths every few hours during the day, while you recover. This helps expand your lungs and prevent complications like pneumonia after surgery.  If you were given a device called an incentive spirometer, use it several times a day to practice deep breathing. Support your chest with a pillow or your arms when you take deep breaths or cough.  Wear compression stockings as told by your health care provider. These stockings help to prevent blood clots and reduce swelling in your legs.  Weigh yourself every day. This helps identify if your body is holding (retaining) fluid that may make your heart and lungs work harder.  Keep all follow-up visits as told by your health care provider. This is important. Contact a health care provider if:  You have more redness, swelling, or pain around any incision.  You have more fluid or blood coming from any incision.  Any incision feels warm to the touch.  You have pus or a bad smell coming from any incision  You have a fever.  You have swelling in your ankles or legs.  You have pain in your legs.  You gain 2 lb (0.9 kg) or more a day.  You are nauseous or you vomit.  You have diarrhea. Get  help right away if:  You have chest pain that spreads to your jaw or arms.  You are short of breath.  You have a fast or irregular heartbeat.  You notice a "clicking" in your breastbone (sternum) when you move.  You have numbness or weakness in your arms or legs.  You feel dizzy or light-headed. Summary  After the procedure, it is common to have pain or discomfort in the incision areas.  Do not take baths, swim, or use a hot tub until your health care provider approves.  Gradually increase your activities. You may need physical therapy or cardiac rehabilitation to help strengthen your muscles and build your endurance.  Weigh yourself every day. This helps identify if your body is holding (retaining) fluid that may make your heart and lungs work harder. This information is not intended to replace advice given to you by your health care provider. Make sure you discuss any questions you have with your  health care provider. Document Released: 06/20/2005 Document Revised: 10/20/2016 Document Reviewed: 10/20/2016 Elsevier Interactive Patient Education  2018 [employer].   5. Activity- up as tolerated, please walk at least 3 times per day.  Avoid strenuous activity, no lifting, pushing, or pulling with your arms over 8-10 lbs for a minimum of 6 weeks  6. If any questions or concerns arise, please do not hesitate to contact our office at [phone]

## 2018-02-21 NOTE — Progress Notes (Signed)
Richard Miu to be D/C'd Home per MD order. Discussed with the patient and all questions fully answered.    IV catheter discontinued intact. Site without signs and symptoms of complications. Dressing and pressure applied.  An After Visit Summary was printed and given to the patient.  Patient escorted via [place], and D/C home via private auto.  Cyndra Numbers  02/21/2018 10:47 AM

## 2018-02-22 ENCOUNTER — Telehealth (HOSPITAL_COMMUNITY): Payer: Self-pay

## 2018-02-22 LAB — GLUCOSE, CAPILLARY: Glucose-Capillary: 108 mg/dL — ABNORMAL HIGH (ref 65–99)

## 2018-02-22 NOTE — Op Note (Signed)
NAMEGARMON, DEHN NO.:  [id number]  MEDICAL RECORD NO.:  [id number]  LOCATION:                                 FACILITY:  PHYSICIAN:  Lanelle Bal, MD    DATE OF BIRTH:  04-28-1956  DATE OF PROCEDURE:  02/17/2018 DATE OF DISCHARGE:  02/21/2018                              OPERATIVE REPORT   PREOPERATIVE DIAGNOSIS:  Coronary occlusive disease with unstable angina.  POSTOPERATIVE DIAGNOSIS:  Coronary occlusive disease with unstable angina.  PROCEDURE PERFORMED:  Coronary artery bypass grafting x4 with the left internal mammary to the left anterior descending coronary artery, reverse saphenous vein graft to the diagonal coronary artery, reverse saphenous vein graft to the intermediate coronary artery, reverse saphenous vein graft to the distal right coronary artery with right leg, calf and thigh endovein harvesting right greater saphenous vein.  SURGEON:  Lanelle Bal, MD.  FIRST ASSISTANT:  Ellwood Handler, PA.  BRIEF HISTORY:  The patient is a 62 year old male who presents with a month of new onset of anginal pain.  He was admitted to [facility] and underwent cardiac catheterization which demonstrated severe 3-vessel coronary artery disease including subtotal occlusion of the LAD with collateral filling from the right coronary artery.  He also had a high- grade stenosis of the right coronary artery greater than 80%.  He had a small circumflex system and a large intermediate vessel that supplied the lateral wall.  Risks and options of surgery were discussed with the patient, who was agreeable and signed informed consent.  DESCRIPTION OF PROCEDURE:  With Swan-Ganz and arterial line monitors in place, the patient underwent general endotracheal anesthesia without incident.  Skin of the chest and legs was prepped with Betadine and draped in usual sterile manner.  Using the Guidant endovein harvesting system, the vein was harvested from the right  thigh and calf.  The vein was of good quality.  Median sternotomy was performed.  Left internal mammary artery was dissected down as a pedicle graft.  The distal artery was divided and had good free flow.  Pericardium was opened, overall ventricular function appeared preserved.  The patient was systemically heparinized.  Ascending aorta was cannulated.  The right atrium was cannulated.  An aortic root vent cardioplegia needle was introduced into the ascending aorta.  The patient was placed on cardiopulmonary bypass at 2.4 L/min per metered squared.  Sites of anastomosis were selected and dissected out of the epicardium.  The patient's body temperature was cooled to 32 degrees.  Aortic crossclamp was applied, 500 mL of cold blood potassium cardioplegia was administered with diastolic arrest of the heart.  Topical cold was also used.  Attention was turned first to the distal right coronary artery, which was opened and admitted a 1.5 mm probe.  Using a running 7-0 Prolene, distal anastomosis with a segment of reverse saphenous vein graft was carried out.  The heart was then elevated and the intermediate vessel, which was intramyocardial, was dissected out and admitted a 1.5 mm probe.  Using a running 7-0 Prolene, a distal anastomosis was performed. The small distal circumflex branches as they arose from the AV groove were too small to bypass.  The larger of 2 diagonal branches were then dissected out.  It was relatively small vessel, it was opened, admitted a 1 mm probe.  Using a running 8-0 Prolene, a distal anastomosis was performed with a second reverse saphenous vein graft.  Attention was then turned to the left anterior descending coronary artery, which was diffusely disease between the mid and distal third of the vessel.  The LAD was opened and admitted a 1 mm probe distally and 1.5 mm probe proximally.  Using a running 8-0 Prolene, left internal mammary artery was anastomosed to the  left anterior descending coronary artery with the crossclamp.  The fascia of the mammary artery was tacked to the epicardium with crossclamp still in place, 3 punch aortotomies were performed and each of the 3 vein grafts were anastomosed to the ascending aorta.  The bulldog was removed from the mammary artery with prompt rise in myocardial septal temperature.  With the aortic crossclamp still in place, the heart was allowed to passively fill and de-airing and the proximal anastomoses were completed.  The aortic cross- clamp was removed with a total cross-clamp time of 89 minutes.  Sites of anastomosis were selected and dissected out of the epicardium.  Atrial and ventricular pacing wires were applied.  Graft markers were applied. The patient was rewarmed to 37 degrees.  He was then ventilated and weaned from cardiopulmonary bypass without difficulty.  He remained hemodynamically stable and was decannulated in usual fashion.  Protamine sulfate was administered with operative field hemostatic.  A left pleural tube and a Blake mediastinal drain were left in place and the sternum was closed with #6 stainless steel wire.  Fascia was closed with interrupted 0 Vicryl, running 3-0 Vicryl subcutaneous tissue through subcuticular stitch in skin edges.  Dry dressings were applied.  Sponge and needle count was reported as correct at the completion of the procedure.  The patient tolerated the procedure without obvious complication.  No blood products were needed during the operative procedure.  Total pump time was 115 minutes.     Lanelle Bal, MD     EG/MEDQ  D:  02/22/2018  T:  02/22/2018  Job:  [id number]

## 2018-02-22 NOTE — Telephone Encounter (Signed)
Patients insurance is active and benefits verified through [employer] - No co-insurance, deductible amount of $6,750/$136.37 has been met, out of pocket amount of $6,750/$136.37 has been met, no co-insurance, and no pre-authorization is required. Spoke with [employer] - Reference [id number]  Will call to see if patient is interested in the CR program. If interested, patient will need to complete follow up appt. Once completed, patient will be contacted for scheduling upon review by the RN Navigator.

## 2018-02-22 NOTE — Telephone Encounter (Signed)
Attempted to call patient to see if interested in CR program - lm on vm

## 2018-02-23 MED FILL — Electrolyte-R (PH 7.4) Solution: INTRAVENOUS | Qty: 4000 | Status: AC

## 2018-02-23 MED FILL — Mannitol IV Soln 20%: INTRAVENOUS | Qty: 500 | Status: AC

## 2018-02-23 MED FILL — Sodium Chloride IV Soln 0.9%: INTRAVENOUS | Qty: 2000 | Status: AC

## 2018-02-23 MED FILL — Sodium Bicarbonate IV Soln 8.4%: INTRAVENOUS | Qty: 50 | Status: AC

## 2018-02-23 MED FILL — Heparin Sodium (Porcine) Inj 1000 Unit/ML: INTRAMUSCULAR | Qty: 10 | Status: AC

## 2018-02-23 MED FILL — Lidocaine HCl IV Inj 20 MG/ML: INTRAVENOUS | Qty: 5 | Status: AC

## 2018-03-09 ENCOUNTER — Telehealth (HOSPITAL_COMMUNITY): Payer: Self-pay

## 2018-03-09 NOTE — Telephone Encounter (Signed)
Patient returned phone call and stated that he is currently active and does not want to wait until May to begin Cardiac Rehab as he is doing very well. Closed referral.

## 2018-03-09 NOTE — Telephone Encounter (Signed)
Attempted to call patient in regards to Cardiac Rehab - lm on vm °

## 2018-03-16 DIAGNOSIS — Z0000 Encounter for general adult medical examination without abnormal findings: Secondary | ICD-10-CM | POA: Diagnosis not present

## 2018-03-16 DIAGNOSIS — Z125 Encounter for screening for malignant neoplasm of prostate: Secondary | ICD-10-CM | POA: Diagnosis not present

## 2018-03-19 ENCOUNTER — Other Ambulatory Visit: Payer: Self-pay | Admitting: Cardiothoracic Surgery

## 2018-03-19 DIAGNOSIS — Z951 Presence of aortocoronary bypass graft: Secondary | ICD-10-CM

## 2018-03-22 ENCOUNTER — Ambulatory Visit (INDEPENDENT_AMBULATORY_CARE_PROVIDER_SITE_OTHER): Payer: Self-pay | Admitting: Physician Assistant

## 2018-03-22 ENCOUNTER — Ambulatory Visit
Admission: RE | Admit: 2018-03-22 | Discharge: 2018-03-22 | Disposition: A | Discharge status: Home / Self Care | Payer: BLUE CROSS/BLUE SHIELD | Source: Ambulatory Visit | Attending: Cardiothoracic Surgery | Admitting: Cardiothoracic Surgery

## 2018-03-22 VITALS — BP 127/85 | HR 78 | Resp 20 | Ht 68.0 in | Wt 194.0 lb

## 2018-03-22 DIAGNOSIS — Z951 Presence of aortocoronary bypass graft: Secondary | ICD-10-CM

## 2018-03-22 DIAGNOSIS — J9811 Atelectasis: Secondary | ICD-10-CM | POA: Diagnosis not present

## 2018-03-22 DIAGNOSIS — I2510 Atherosclerotic heart disease of native coronary artery without angina pectoris: Secondary | ICD-10-CM

## 2018-03-22 NOTE — Progress Notes (Signed)
Todd Buck       [place] [address]             [phone]      Todd Buck is a 62 y.o. male patient status post coronary bypass grafting x4.  He is here today for his 4-week routine follow-up appointment.  Overall he is been doing very well.  He started driving on his own will 2 weeks after surgery.  He returned to work without anyone releasing him at day after surgery.  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  He did however share that he has been lifting things up to 40 pounds.   1. S/P CABG x 4   2. Coronary artery disease involving native heart without angina pectoris, unspecified vessel or lesion type    Past Medical History:  Diagnosis Date  . Hyperlipidemia 02/15/2018  . Kidney disease, chronic, stage III (GFR 30-59 ml/min) ([place]) 02/15/2018  . Kidney stones   . Obesity (BMI 30-39.9) 02/15/2018   No past surgical history pertinent negatives on file. Scheduled Meds: Current Outpatient Medications on File Prior to Visit  Medication Sig Dispense Refill  . acetaminophen (TYLENOL) 325 MG tablet Take 2 tablets (650 mg total) by mouth every 6 (six) hours as needed for mild pain.    Marland Kitchen aspirin EC 325 MG EC tablet Take 1 tablet (325 mg total) by mouth daily. 30 tablet 0  . atorvastatin (LIPITOR) 40 MG tablet Take 1 tablet (40 mg total) by mouth daily at 6 PM. 30 tablet 1  . metoprolol tartrate (LOPRESSOR) 25 MG tablet Take 1 tablet (25 mg total) by mouth 2 (two) times daily. 60 tablet 1   No current facility-administered medications on file prior to visit.      Blood pressure 127/85, pulse 78, resp. rate 20, height 5\' 8"  (1.727 m), weight 194 lb (88 kg), SpO2 97 %.  Subjective: Todd Buck feels go today and has been going to work since surgery without clearance. He has had no pain or symptoms.   Objective  Cor: RRR, no murmur Pulm: CTA bilaterally Abd: no tenderness Wound: sternal incision c/d/i, EVH incision is  healing well.  Ext: 1+ edema on his EVH leg (right)   CLINICAL DATA:  Initial evaluation for history of CABG.  EXAM: CHEST - 2 VIEW  COMPARISON:  Prior radiograph from 02/20/2018.  FINDINGS: Median sternotomy wires with underlying CABG markers noted, stable. Transverse heart size is stable, and remains within normal limits. Mediastinal silhouette normal.  Lungs normally inflated. Improved aeration at the left lung base as compared to previous. Mild residual left perihilar and basilar subsegmental atelectasis. No focal infiltrates. No pulmonary edema or pleural effusion. No pneumothorax.  Osseous structures are unchanged.  IMPRESSION: 1. Improved aeration at the left lung base as compared to previous with mild residual left perihilar and basilar atelectasis. 2. Sequelae of prior CABG.   Electronically Signed   By: Jeannine Boga M.D.   On: 03/22/2018 13:18  Assessment & Plan  Todd Buck and I today had a serious conversation about sternal precautions and I reminded him that  he should not be lifting anything more than 10 pounds at this point in his postoperative period.  I also reminded him that he should not have started driving until cleared by office.  He has not been taking any pain medications so I have officially cleared him today for driving.  He occasionally takes Excedrin Migraine which I told him to discontinue due to the NSAIDs that are in the medication.  He asked if he could go on a fishing trip on a boat and I stated to wait a few more weeks until his sternum has completely healed.  He has been doing plenty of walking  without any pain and his incision is healing well.  I reviewed his chest x-ray and his sternal wires are all aligned and the sternum is stable.  Earlier on in his postoperative course he did have some movement of his sternum but he has not felt that recently.  I stated that in order for his bone to heal correctly it is important to abide by  our sternal precautions.  He asked about training for a [place] hunting trip that he plans to do in November which requires him to carry 75 pounds on his back.  I stated that we recommend waiting 3 months before lifting anything heavier than 50 pounds but he may start adding a few pounds a week at this point.  He is cleared to use a stationary bike as long as he is not putting pressure on his chest.  He asked if he could start running and I stated to wait a few more weeks.  He agreed with my suggestions and promised to abide.  He contacted cardiac rehab which did not have an opening for evaluation until the end of May.  He lost interest in having to wait and decided to rehab on his own.  He had no further questions at this time.  I have released Todd Buck to his cardiology team.  I did not make any medication changes today.  He will follow-up with cardiology and primary care.  He can follow-up with our office on an as-needed basis.  He is welcome to call our office for any questions or concerns.   Todd Buck 03/22/2018

## 2018-03-22 NOTE — Patient Instructions (Signed)
You are encouraged to enroll and participate in the outpatient cardiac rehab program beginning as soon as practical.  Make every effort to stay physically active, get some type of exercise on a regular basis, and stick to a "heart healthy diet".  The long term benefits for regular exercise and a healthy diet are critically important to your overall health and wellbeing.  You may return to driving an automobile as long as you are no longer requiring oral narcotic pain relievers during the daytime.  It would be wise to start driving only short distances during the daylight and gradually increase from there as you feel comfortable.

## 2018-04-13 DIAGNOSIS — E785 Hyperlipidemia, unspecified: Secondary | ICD-10-CM | POA: Diagnosis not present

## 2018-04-13 DIAGNOSIS — I2510 Atherosclerotic heart disease of native coronary artery without angina pectoris: Secondary | ICD-10-CM | POA: Diagnosis not present

## 2018-04-30 ENCOUNTER — Ambulatory Visit
Admission: RE | Admit: 2018-04-30 | Discharge: 2018-04-30 | Disposition: A | Discharge status: Home / Self Care | Payer: BLUE CROSS/BLUE SHIELD | Source: Ambulatory Visit | Attending: Family Medicine | Admitting: Family Medicine

## 2018-04-30 ENCOUNTER — Other Ambulatory Visit: Payer: Self-pay | Admitting: Family Medicine

## 2018-04-30 DIAGNOSIS — L219 Seborrheic dermatitis, unspecified: Secondary | ICD-10-CM | POA: Diagnosis not present

## 2018-04-30 DIAGNOSIS — R0789 Other chest pain: Secondary | ICD-10-CM

## 2018-04-30 DIAGNOSIS — R1013 Epigastric pain: Secondary | ICD-10-CM | POA: Diagnosis not present

## 2018-04-30 DIAGNOSIS — J9811 Atelectasis: Secondary | ICD-10-CM | POA: Diagnosis not present

## 2018-04-30 MED ORDER — IOPAMIDOL (ISOVUE-300) INJECTION 61%
75.0000 mL | Freq: Once | INTRAVENOUS | Status: AC | PRN
  Administered 2018-04-30: 75 mL via INTRAVENOUS

## 2018-09-16 DIAGNOSIS — N183 Chronic kidney disease, stage 3 (moderate): Secondary | ICD-10-CM | POA: Diagnosis not present

## 2018-09-16 DIAGNOSIS — E7800 Pure hypercholesterolemia, unspecified: Secondary | ICD-10-CM | POA: Diagnosis not present

## 2018-09-16 DIAGNOSIS — I2510 Atherosclerotic heart disease of native coronary artery without angina pectoris: Secondary | ICD-10-CM | POA: Diagnosis not present

## 2018-10-05 ENCOUNTER — Ambulatory Visit (INDEPENDENT_AMBULATORY_CARE_PROVIDER_SITE_OTHER): Payer: BLUE CROSS/BLUE SHIELD | Admitting: Cardiology

## 2018-10-05 ENCOUNTER — Encounter: Payer: Self-pay | Admitting: Cardiology

## 2018-10-05 VITALS — BP 124/76 | HR 60 | Ht 68.0 in | Wt 194.1 lb

## 2018-10-05 DIAGNOSIS — I2510 Atherosclerotic heart disease of native coronary artery without angina pectoris: Secondary | ICD-10-CM | POA: Diagnosis not present

## 2018-10-05 DIAGNOSIS — Z951 Presence of aortocoronary bypass graft: Secondary | ICD-10-CM

## 2018-10-05 DIAGNOSIS — E782 Mixed hyperlipidemia: Secondary | ICD-10-CM

## 2018-10-05 MED ORDER — METOPROLOL TARTRATE 25 MG PO TABS
25.0000 mg | ORAL_TABLET | Freq: Two times a day (BID) | ORAL | 3 refills | Status: DC
Start: 2018-10-05 — End: 2018-10-05

## 2018-10-05 MED ORDER — ATORVASTATIN CALCIUM 40 MG PO TABS: 80.0000 mg | ORAL_TABLET | Freq: Every day | ORAL | 3 refills | Status: DC

## 2018-10-05 MED ORDER — METOPROLOL TARTRATE 25 MG PO TABS: 25.0000 mg | ORAL_TABLET | Freq: Two times a day (BID) | ORAL | 3 refills | Status: DC

## 2018-10-05 NOTE — Progress Notes (Signed)
Cardiology Office Note:    Date:  10/05/2018   ID:  Todd Buck, DOB 25-Nov-1956, MRN [id number]  PCP:  Shirline Frees, MD  Cardiologist:  Jenne Campus, MD    Referring MD: Shirline Frees, MD   Chief Complaint  Patient presents with  . Follow up on CABG  Doing great asymptomatic  History of Present Illness:    Todd Buck is a 62 y.o. male with coronary artery disease, status post coronary artery bypass graft in March of this year.  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  No chest pain tightness squeezing pressure burning chest.  He is getting ready to go to [place] to elk hunting.  He is excited about that.  Past Medical History:  Diagnosis Date  . Hyperlipidemia 02/15/2018  . Kidney disease, chronic, stage III (GFR 30-59 ml/min) ([place]) 02/15/2018  . Kidney stones   . Obesity (BMI 30-39.9) 02/15/2018    Past Surgical History:  Procedure Laterality Date  . COLONOSCOPY  2007  . CORONARY ARTERY BYPASS GRAFT N/A 02/17/2018   Procedure: CORONARY ARTERY BYPASS GRAFTING (CABG) x 4 WITH ENDOSCOPIC HARVESTING OF RIGHT SAPHENOUS VEIN. LIMA TO LAD, SVG TO DISTAL RCA, SVG TO DIAGONAL, SVG TO INTERMEDIUS;  Surgeon: Grace Isaac, MD;  Location: [facility];  Service: Open Heart Surgery;  Laterality: N/A;  . dental implants    . LASIK     EYES  . LEFT HEART CATH AND CORONARY ANGIOGRAPHY N/A 02/16/2018   Procedure: LEFT HEART CATH AND CORONARY ANGIOGRAPHY;  Surgeon: Leonie Man, MD;  Location: [facility];  Service: Cardiovascular;  Laterality: N/A;  . LITHOTRIPSY    . POLYPECTOMY    . TEE WITHOUT CARDIOVERSION N/A 02/17/2018   Procedure: TRANSESOPHAGEAL ECHOCARDIOGRAM (TEE);  Surgeon: Grace Isaac, MD;  Location: [facility];  Service: Open Heart Surgery;  Laterality: N/A;    Current Medications: Current Meds  Medication Sig  . aspirin EC 325 MG EC tablet Take 1 tablet  (325 mg total) by mouth daily.  Marland Kitchen atorvastatin (LIPITOR) 40 MG tablet Take 2 tablets (80 mg total) by mouth daily at 6 PM.  . metoprolol tartrate (LOPRESSOR) 25 MG tablet Take 1 tablet (25 mg total) by mouth 2 (two) times daily.  . [DISCONTINUED] atorvastatin (LIPITOR) 40 MG tablet Take 1 tablet (40 mg total) by mouth daily at 6 PM.     Allergies:   Patient has no known allergies.   Social History   Socioeconomic History  . Marital status: Married    Spouse name: Not on file  . Number of children: Not on file  . Years of education: Not on file  . Highest education level: Not on file  Occupational History  . Not on file  Social Needs  . Financial resource strain: Not on file  . Food insecurity:    Worry: Not on file    Inability: Not on file  . Transportation needs:    Medical: Not on file    Non-medical: Not on file  Tobacco Use  . Smoking status: Former Research scientist (life sciences)  . Smokeless tobacco: Never Used  . Tobacco comment: 40 years ago  Substance and Sexual Activity  . Alcohol use: No  . Drug use: No  . Sexual activity: Yes  Lifestyle  . Physical activity:    Days per week: Not on file  Minutes per session: Not on file  . Stress: Not on file  Relationships  . Social connections:    Talks on phone: Not on file    Gets together: Not on file    Attends religious service: Not on file    Active member of club or organization: Not on file    Attends meetings of clubs or organizations: Not on file    Relationship status: Not on file  Other Topics Concern  . Not on file  Social History Narrative  . Not on file     Family History: The patient's family history includes Heart disease in his father and mother; Hyperlipidemia in his sister and sister; Renal Disease in his sister. There is no history of Colon cancer. ROS:   Please see the history of present illness.    All 14 point review of systems negative except as described per history of present illness  EKGs/Labs/Other  Studies Reviewed:      Recent Labs: 02/15/2018: TSH 1.188 02/16/2018: ALT 19 02/18/2018: Magnesium 2.3 02/20/2018: BUN 20; Creatinine, Ser 1.31; Hemoglobin 11.3; Platelets 168; Potassium 4.2; Sodium 138  Recent Lipid Panel    Component Value Date/Time   CHOL 239 (H) 02/15/2018 1646   TRIG 249 (H) 02/15/2018 1646   HDL 46 02/15/2018 1646   CHOLHDL 5.2 02/15/2018 1646   VLDL 50 (H) 02/15/2018 1646   LDLCALC 143 (H) 02/15/2018 1646    Physical Exam:    VS:  BP 124/76   Pulse 60   Ht 5\' 8"  (1.727 m)   Wt 194 lb 1.9 oz (88.1 kg)   SpO2 98%   BMI 29.52 kg/m     Wt Readings from Last 3 Encounters:  10/05/18 194 lb 1.9 oz (88.1 kg)  03/22/18 194 lb (88 kg)  02/21/18 193 lb 6.4 oz (87.7 kg)     GEN:  Well nourished, well developed in no acute distress HEENT: Normal NECK: No JVD; No carotid bruits LYMPHATICS: No lymphadenopathy CARDIAC: RRR, no murmurs, no rubs, no gallops RESPIRATORY:  Clear to auscultation without rales, wheezing or rhonchi  ABDOMEN: Soft, non-tender, non-distended MUSCULOSKELETAL:  No edema; No deformity  SKIN: Warm and dry LOWER EXTREMITIES: no swelling NEUROLOGIC:  Alert and oriented x 3 PSYCHIATRIC:  Normal affect   ASSESSMENT:    1. Mixed hyperlipidemia   2. Coronary artery disease involving native coronary artery of native heart without angina pectoris   3. S/P CABG x 4    PLAN:    In order of problems listed above:  1. Coronary artery disease status post coronary artery bypass grafting March.  Recovering very well.  Will review all medications all appropriate.continue.  Interestingly he does not have too many risk factors for having coronary artery disease he has his diet was not perfectly modified it we talked about Mediterranean diet as well as low-salt diet.  He understands we will try to compliant.  His LDL is 60 however with his lack of too many risk factors for coronary artery disease and significant severe coronary artery disease that required  bypass surgery I would increase the dose of Lipitor from 40-80 then we will check his AST ALT as well as fasting lipid profile.  We did talk also about overall healthy lifestyle exercises on the regular basis good diet he is trying to comply. 2. Mixed dyslipidemia again plan is to increase the dose of his Lipitor from 40-80 and checking his AST ALT in the future.   Medication Adjustments/Labs and  Tests Ordered: Current medicines are reviewed at length with the patient today.  Concerns regarding medicines are outlined above.  Orders Placed This Encounter  Procedures  . Lipid panel  . Hepatic function panel   Medication changes:  Meds ordered this encounter  Medications  . atorvastatin (LIPITOR) 40 MG tablet    Sig: Take 2 tablets (80 mg total) by mouth daily at 6 PM.    Dispense:  180 tablet    Refill:  3    Signed, Park Liter, MD, [facility] 10/05/2018 9:18 AM    [place]

## 2018-10-05 NOTE — Patient Instructions (Addendum)
Medication Instructions:  Your physician has recommended you make the following change in your medication:   INCREASE: Lipitor to 80 mg daily.   If you need a refill on your cardiac medications before your next appointment, please call your pharmacy.   Lab work: Your physician recommends that you return for lab work on December 23rd  2019 (fasting): Lipids, and LFT   If you have labs (blood work) drawn today and your tests are completely normal, you will receive your results only by: Marland Kitchen MyChart Message (if you have MyChart) OR . A paper copy in the mail If you have any lab test that is abnormal or we need to change your treatment, we will call you to review the results.  Testing/Procedures: None.   Follow-Up: At [facility], you and your health needs are our priority.  As part of our continuing mission to provide you with exceptional heart care, we have created designated Provider Care Teams.  These Care Teams include your primary Cardiologist (physician) and Advanced Practice Providers (APPs -  Physician Assistants and Nurse Practitioners) who all work together to provide you with the care you need, when you need it. You will need a follow up appointment in 6 months.  Please call our office 2 months in advance to schedule this appointment.  You may see No primary care provider on file. or another member of our [employer] Provider Team in [place]: Shirlee More, MD . Jyl Heinz, MD  Any Other Special Instructions Will Be Listed Below (If Applicable).

## 2018-10-08 ENCOUNTER — Telehealth: Payer: Self-pay | Admitting: Cardiology

## 2018-10-08 NOTE — Telephone Encounter (Signed)
Can you resubmit his atorvastatin? Pharmacy is saying they did not get it.

## 2018-10-08 NOTE — Telephone Encounter (Signed)
Patient requesting he be ordered 50 mg metoprolol once daily rather than metoprolol 25 mg twice daily. Will verify with Dr. Agustin Cree.

## 2018-10-08 NOTE — Telephone Encounter (Signed)
No problem, metoprolol succinate 50 mg po qd

## 2018-10-11 ENCOUNTER — Telehealth: Payer: Self-pay | Admitting: Cardiology

## 2018-10-11 MED ORDER — METOPROLOL SUCCINATE ER 50 MG PO TB24: 50.0000 mg | ORAL_TABLET | Freq: Every day | ORAL | 1 refills | Status: DC

## 2018-10-11 MED ORDER — ATORVASTATIN CALCIUM 40 MG PO TABS: 40.0000 mg | ORAL_TABLET | Freq: Every day | ORAL | 1 refills | Status: DC

## 2018-10-11 NOTE — Telephone Encounter (Signed)
Spoke with wife regarding medication regimen. Wife confirmed patient is taking 40 mg daily of lipitor and 50 mg daily of metoprolol succinate. Updated this is patient's chart. Wife will inform patient. Refills sent in as well. No further questions.

## 2018-10-11 NOTE — Telephone Encounter (Signed)
Patient is leaving Wednesday am to go out of town and his Atorvastatin is still not taken care of. Patient states pharmacy sent back to [place] due to insurance issue, can you please call wife at [phone]

## 2018-10-11 NOTE — Telephone Encounter (Signed)
Called patient to change instructions for how he takes metoprolol. Patient isn't wanting to do this at this time due to an upcoming hunting trip. Asked patient to verify his medictations and how they are taken, patient will call back and verify this information. Dr. Agustin Cree aware.

## 2018-10-11 NOTE — Addendum Note (Signed)
Addended by: Ashok Norris on: 10/11/2018 09:54 AM   Modules accepted: Orders

## 2018-12-02 ENCOUNTER — Telehealth: Payer: Self-pay | Admitting: Cardiology

## 2018-12-02 NOTE — Telephone Encounter (Signed)
°*  STAT* If patient is at the pharmacy, call can be transferred to refill team.   1. Which medications need to be refilled? (please list name of each medication and dose if known) Atorvastatin *)MG this time   2. Which pharmacy/location (including street and city if local pharmacy) is medication to be sent to? [facility] at [address]   3. Do they need a 30 day or 90 day supply? Required dosage as need per dose.   Also does he need labsince he did not increase to 80mg  til now??

## 2018-12-03 MED ORDER — ATORVASTATIN CALCIUM 80 MG PO TABS: 80.0000 mg | ORAL_TABLET | Freq: Every day | ORAL | 1 refills | Status: DC

## 2018-12-03 NOTE — Addendum Note (Signed)
Addended by: Ashok Norris on: 12/03/2018 08:27 AM   Modules accepted: Orders

## 2018-12-03 NOTE — Telephone Encounter (Signed)
Per Dr. Agustin Cree patient's wife advised to start atorvastatin 80 mg daily and have lipids rechecked in 6 weeks. She verbally understands and will inform patient

## 2019-01-13 DIAGNOSIS — E782 Mixed hyperlipidemia: Secondary | ICD-10-CM | POA: Diagnosis not present

## 2019-01-14 LAB — LIPID PANEL
CHOLESTEROL TOTAL: 121 mg/dL (ref 100–199)
Chol/HDL Ratio: 2.9 ratio (ref 0.0–5.0)
HDL: 42 mg/dL (ref >39)
LDL Calculated: 62 mg/dL (ref 0–99)
Triglycerides: 85 mg/dL (ref 0–149)
VLDL Cholesterol Cal: 17 mg/dL (ref 5–40)

## 2019-01-14 LAB — HEPATIC FUNCTION PANEL
ALK PHOS: 82 IU/L (ref 39–117)
ALT: 31 IU/L (ref 0–44)
AST: 26 IU/L (ref 0–40)
Albumin: 4.4 g/dL (ref 3.8–4.8)
BILIRUBIN TOTAL: 0.8 mg/dL (ref 0.0–1.2)
BILIRUBIN, DIRECT: 0.22 mg/dL (ref 0.00–0.40)
Total Protein: 6.8 g/dL (ref 6.0–8.5)

## 2019-03-28 ENCOUNTER — Telehealth: Payer: Self-pay

## 2019-03-28 NOTE — Telephone Encounter (Signed)
Called patient about his 04/04/2019 8:00 am appointment. Tried to discuss changing his currently scheduled appointment to a Televisit at which point he did not want to do a Televisit and stated that he would prefer to wait until everything was back to normal and he be seen in the office and RJK could be hands on and check him out instead of over the phone/audiovisusal. I advised that at this time we are unsure when this will happen and that I could reschedule him. He stated that he did not want to reschedule at this time. I advised I would could put him in our recall and hopefully we would be able to see him. He agreed that is what he would prefer. I have added him to the recall, to be sent out in the time range of July/August.

## 2019-04-04 ENCOUNTER — Ambulatory Visit: Payer: BLUE CROSS/BLUE SHIELD | Admitting: Cardiology

## 2019-04-13 ENCOUNTER — Telehealth: Payer: Self-pay | Admitting: Cardiology

## 2019-04-13 MED ORDER — METOPROLOL SUCCINATE ER 50 MG PO TB24: 50.0000 mg | ORAL_TABLET | Freq: Every day | ORAL | 1 refills | Status: DC

## 2019-04-13 MED ORDER — METOPROLOL SUCCINATE ER 50 MG PO TB24: 50.0000 mg | ORAL_TABLET | Freq: Every day | ORAL | 0 refills | Status: DC

## 2019-04-13 MED ORDER — ATORVASTATIN CALCIUM 80 MG PO TABS: 80.0000 mg | ORAL_TABLET | Freq: Every day | ORAL | 0 refills | Status: DC

## 2019-04-13 MED ORDER — ATORVASTATIN CALCIUM 80 MG PO TABS: 80.0000 mg | ORAL_TABLET | Freq: Every day | ORAL | 1 refills | Status: DC

## 2019-04-13 NOTE — Telephone Encounter (Signed)
Toprol and Lipitor refills sent to [facility] on [address] per pt preference

## 2019-04-13 NOTE — Telephone Encounter (Signed)
°*  STAT* If patient is at the pharmacy, call can be transferred to refill team.   1. Which medications need to be refilled? (please list name of each medication and dose if known) metoprolol succinate (TOPROL-XL) 50 MG 24 hr tablet,   atorvastatin (LIPITOR) 80 MG tablet   2. Which pharmacy/location (including street and city if local pharmacy) is medication to be sent to?  [id number] - [place], [place] - [address] AT [place] [phone] (Phone) [phone] (Fax)    3. Do they need a 30 day or 90 day supply? 90 day

## 2019-04-13 NOTE — Addendum Note (Signed)
Addended by: Aleatha Borer on: 04/13/2019 11:34 AM   Modules accepted: Orders

## 2019-05-06 IMAGING — DX DG CHEST 2V
2 series · 3 of 3 positions shown · non-contrast
Comparison: Abdominal radiographs 10/24/2010.

CLINICAL DATA: Unstable angina.  For catheterization tomorrow.

EXAM:
CHEST  2 VIEW

[Series 2: chest lat · 0.14mm/px · 2 of 2 slices shown]
[im 1/2]
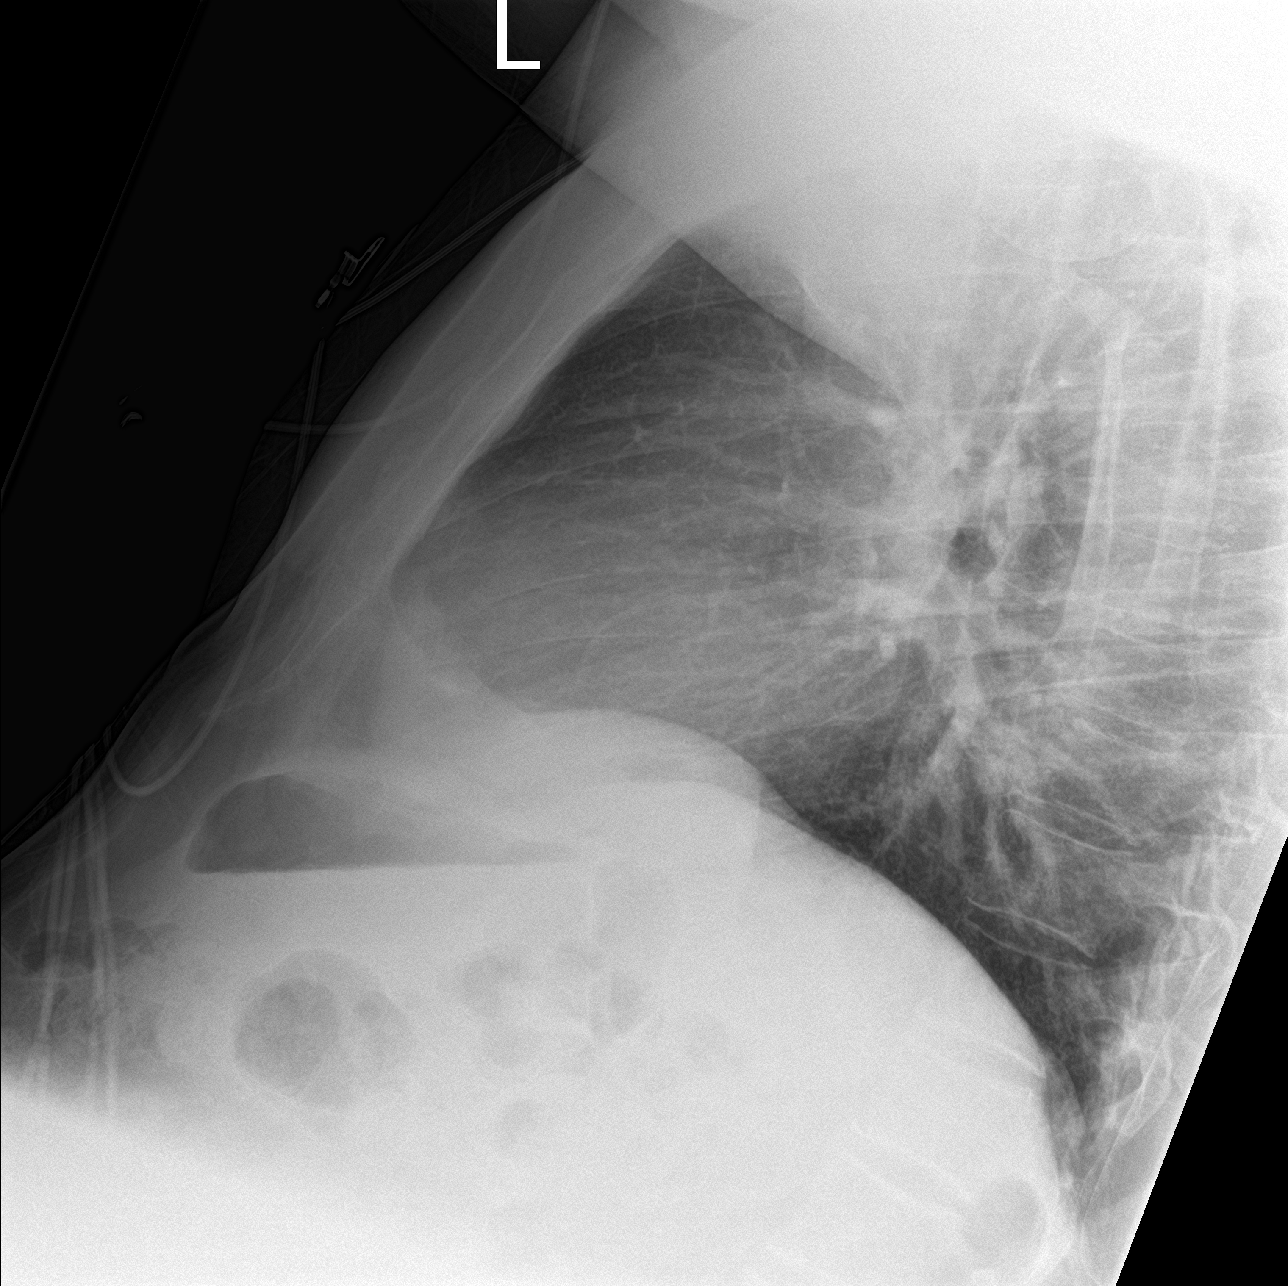
[im 2/2]
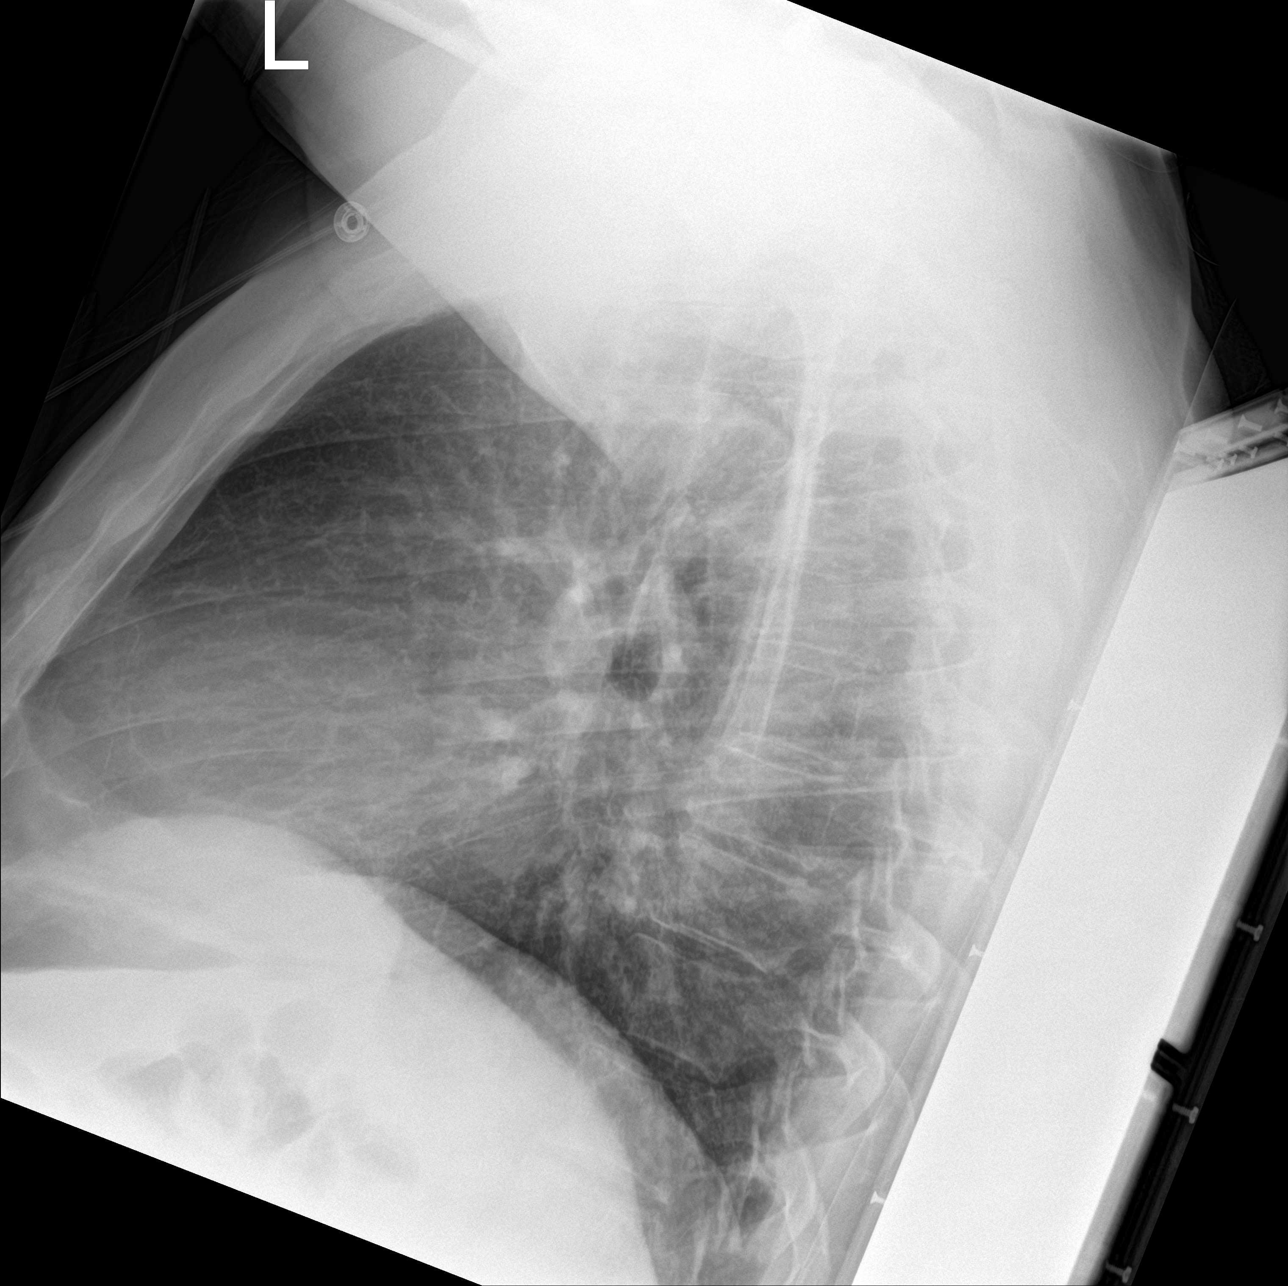

[chest ap]
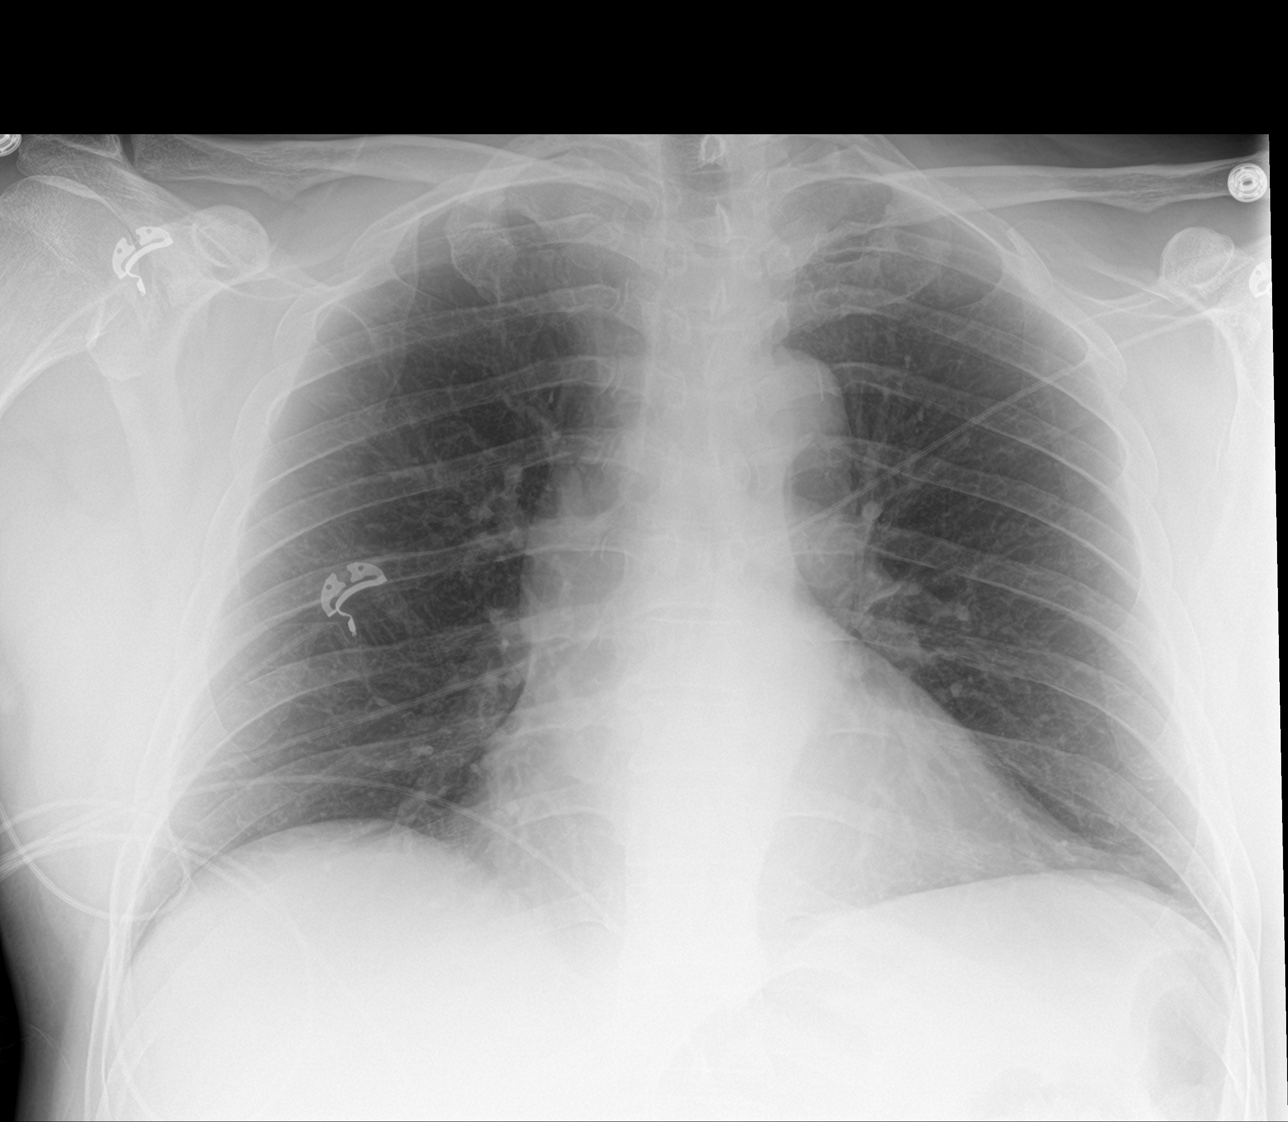

[3 of 3 positions shown; findings below may reference images not displayed]

FINDINGS: The heart size and mediastinal contours are normal. The lungs are
clear. There is no pleural effusion or pneumothorax. No acute
osseous findings are identified.
IMPRESSION: No active cardiopulmonary process.

## 2019-05-10 DIAGNOSIS — Z125 Encounter for screening for malignant neoplasm of prostate: Secondary | ICD-10-CM | POA: Diagnosis not present

## 2019-05-10 DIAGNOSIS — N183 Chronic kidney disease, stage 3 (moderate): Secondary | ICD-10-CM | POA: Diagnosis not present

## 2019-05-10 DIAGNOSIS — E7800 Pure hypercholesterolemia, unspecified: Secondary | ICD-10-CM | POA: Diagnosis not present

## 2019-05-10 DIAGNOSIS — R3989 Other symptoms and signs involving the genitourinary system: Secondary | ICD-10-CM | POA: Diagnosis not present

## 2019-05-17 DIAGNOSIS — N132 Hydronephrosis with renal and ureteral calculous obstruction: Secondary | ICD-10-CM | POA: Diagnosis not present

## 2019-05-17 DIAGNOSIS — N201 Calculus of ureter: Secondary | ICD-10-CM | POA: Diagnosis not present

## 2019-05-17 DIAGNOSIS — R310 Gross hematuria: Secondary | ICD-10-CM | POA: Diagnosis not present

## 2019-05-20 ENCOUNTER — Other Ambulatory Visit: Payer: Self-pay | Admitting: Urology

## 2019-05-23 ENCOUNTER — Encounter (HOSPITAL_COMMUNITY): Payer: Self-pay | Admitting: *Deleted

## 2019-05-24 ENCOUNTER — Other Ambulatory Visit: Payer: Self-pay | Admitting: Cardiology

## 2019-05-24 MED ORDER — ATORVASTATIN CALCIUM 80 MG PO TABS: 80.0000 mg | ORAL_TABLET | Freq: Every day | ORAL | 0 refills | Status: DC

## 2019-05-24 NOTE — Telephone Encounter (Signed)
°*  STAT* If patient is at the pharmacy, call can be transferred to refill team.   1. Which medications need to be refilled? (please list name of each medication and dose if known) Atorvastatin 80mg  once daily  2. Which pharmacy/location (including street and city if local pharmacy) is medication to be sent to? [facility] on [address] [place]  3. Do they need a 30 day or 90 day supply? [place]

## 2019-05-27 ENCOUNTER — Other Ambulatory Visit (HOSPITAL_COMMUNITY)
Admission: RE | Admit: 2019-05-27 | Discharge: 2019-05-27 | Disposition: A | Discharge status: Home / Self Care | Payer: BC Managed Care – PPO | Source: Ambulatory Visit | Attending: Urology | Admitting: Urology

## 2019-05-27 DIAGNOSIS — Z01812 Encounter for preprocedural laboratory examination: Secondary | ICD-10-CM | POA: Insufficient documentation

## 2019-05-27 DIAGNOSIS — Z1159 Encounter for screening for other viral diseases: Secondary | ICD-10-CM | POA: Insufficient documentation

## 2019-05-27 NOTE — Progress Notes (Signed)
SPOKE W/  Sherre Poot (pt having Covid 19 test later this afternoon and verified self quarantining till procedure)     SCREENING SYMPTOMS OF COVID 19:   COUGH--NO  RUNNY NOSE--NO  SORE THROAT---NO  NASAL CONGESTION----NO  SNEEZING----NO  SHORTNESS OF BREATH---NO  DIFFICULTY BREATHING---NO  TEMP >100.0 -----NO  UNEXPLAINED BODY ACHES------NO  CHILLS --------NO  HEADACHES ---------NO  LOSS OF SMELL/ TASTE --------NO    HAVE YOU OR ANY FAMILY MEMBER TRAVELLED PAST 14 DAYS OUT OF THE   COUNTY---NO STATE----NO COUNTRY----NO  HAVE YOU OR ANY FAMILY MEMBER BEEN EXPOSED TO ANYONE WITH COVID 19? NO

## 2019-05-28 LAB — NOVEL CORONAVIRUS, NAA (HOSP ORDER, SEND-OUT TO REF LAB; TAT 18-24 HRS): SARS-CoV-2, NAA: NOT DETECTED

## 2019-05-30 ENCOUNTER — Ambulatory Visit (HOSPITAL_COMMUNITY)
Admission: RE | Admit: 2019-05-30 | Discharge: 2019-05-30 | Disposition: A | Discharge status: Home / Self Care | Payer: BC Managed Care – PPO | Attending: Urology | Admitting: Urology

## 2019-05-30 ENCOUNTER — Encounter (HOSPITAL_COMMUNITY)
Admission: RE | Disposition: A | Discharge status: Home / Self Care | Payer: Self-pay | Source: Home / Self Care | Attending: Urology

## 2019-05-30 ENCOUNTER — Encounter (HOSPITAL_COMMUNITY): Payer: Self-pay | Admitting: General Practice

## 2019-05-30 ENCOUNTER — Ambulatory Visit (HOSPITAL_COMMUNITY): Payer: BC Managed Care – PPO

## 2019-05-30 DIAGNOSIS — N201 Calculus of ureter: Secondary | ICD-10-CM | POA: Diagnosis not present

## 2019-05-30 DIAGNOSIS — Z79899 Other long term (current) drug therapy: Secondary | ICD-10-CM | POA: Insufficient documentation

## 2019-05-30 DIAGNOSIS — Z87891 Personal history of nicotine dependence: Secondary | ICD-10-CM | POA: Diagnosis not present

## 2019-05-30 DIAGNOSIS — R319 Hematuria, unspecified: Secondary | ICD-10-CM | POA: Diagnosis not present

## 2019-05-30 DIAGNOSIS — Z951 Presence of aortocoronary bypass graft: Secondary | ICD-10-CM | POA: Diagnosis not present

## 2019-05-30 DIAGNOSIS — I10 Essential (primary) hypertension: Secondary | ICD-10-CM | POA: Diagnosis not present

## 2019-05-30 DIAGNOSIS — Z7982 Long term (current) use of aspirin: Secondary | ICD-10-CM | POA: Diagnosis not present

## 2019-05-30 DIAGNOSIS — Z01818 Encounter for other preprocedural examination: Secondary | ICD-10-CM | POA: Diagnosis not present

## 2019-05-30 HISTORY — DX: Personal history of urinary calculi: Z87.442

## 2019-05-30 HISTORY — PX: EXTRACORPOREAL SHOCK WAVE LITHOTRIPSY: SHX1557

## 2019-05-30 HISTORY — DX: Gastro-esophageal reflux disease without esophagitis: K21.9

## 2019-05-30 HISTORY — DX: Atherosclerotic heart disease of native coronary artery without angina pectoris: I25.10

## 2019-05-30 SURGERY — LITHOTRIPSY, ESWL
Anesthesia: LOCAL | Laterality: Right

## 2019-05-30 MED ORDER — SODIUM CHLORIDE 0.9 % IV SOLN
INTRAVENOUS | Status: DC
  Administered 2019-05-30: 07:00:00 via INTRAVENOUS

## 2019-05-30 MED ORDER — DIPHENHYDRAMINE HCL 25 MG PO CAPS
25.0000 mg | ORAL_CAPSULE | ORAL | Status: AC
  Administered 2019-05-30: 06:00:00 25 mg via ORAL
  Filled 2019-05-30: qty 1

## 2019-05-30 MED ORDER — TAMSULOSIN HCL 0.4 MG PO CAPS: 0.4000 mg | ORAL_CAPSULE | Freq: Every day | ORAL | 0 refills | Status: DC

## 2019-05-30 MED ORDER — CIPROFLOXACIN HCL 500 MG PO TABS
500.0000 mg | ORAL_TABLET | ORAL | Status: AC
  Administered 2019-05-30: 500 mg via ORAL
  Filled 2019-05-30: qty 1

## 2019-05-30 MED ORDER — DIAZEPAM 5 MG PO TABS
10.0000 mg | ORAL_TABLET | ORAL | Status: AC
  Administered 2019-05-30: 10 mg via ORAL
  Filled 2019-05-30: qty 2

## 2019-05-30 NOTE — Op Note (Signed)
See [employer] OP note scanned into chart. Also because of the size, density, location and other factors that cannot be anticipated I feel this will likely be a staged procedure. This fact supersedes any indication in the scanned [place] stone operative note to the contrary.    Patient's stone was at the UVJ, and with multiple angles of fluoro did not appear to be in his bladder.  We opted to proceed with treatment after discussion with the patient.

## 2019-05-30 NOTE — Progress Notes (Signed)
Discharge instructions given, all questions answered. Pt voided prior to discharge. Pt discharged home with wife. Pt ambulatory on discharge, RN walk out with him.

## 2019-05-30 NOTE — Discharge Instructions (Signed)
See [facility] discharge instructions in chart.  

## 2019-05-30 NOTE — H&P (Signed)
05/17/2019: Last seen in early 2018 for evaluation of prostatitis. At that time patient was taking ciprofloxacin prescribed by PCP with improvement in symptoms. He has not followed up since then. He denies a past history of kidney stones with most recent KUB imaging from a few years ago noting bilateral nonobstructing calculi with a stone in each kidney. He's previously undergone ureteroscopy for stone management some 9 years ago. At baseline he does have chronic renal insufficiency/chronic kidney disease. Most recent creatinine in May of this year 1.6 to slightly elevated from last fall when it was 1.5. Most recent PSA assessment in late May of this year was 1.48.   He is referred by PCP for blood in the urine. Patient was empirically started on Bactrim on 5/26 for 10 days but urine culture was negative. Review of provider notes it is unclear if patient was having symptoms of an underlying infection at that time. Urinalysis did reveal large hemoglobin but a microscopic analysis was not performed.     CC: I have blood in my urine.  HPI: Todd Buck is a 63 year-old male established patient who is here for blood in the urine.  05/17/2019: Painless bright red blood in the urine usually at the beginning of his urine stream noted intermittently beginning about 2 weeks ago. Not associated with painful or burning urination, increased frequency/urgency or changes in force of stream. No associated fevers/chills, nausea/vomiting. Approximately 1 month ago he had left lower back and flank pain that spontaneously resolved. He stated the symptoms felt like a previous stone event but he never observe any stone material passage. Denies any recent stone material passage in the past couple of years.   He first noticed the symptoms approximately 05/03/2019. He did see the blood in his urine. He has not seen blood clots.   He does not have a burning sensation when he urinates. He is not currently having trouble  urinating.   He has had kidney stones. He is not having pain. He has not recently had unwanted weight loss.   His last U/S or CT Scan was 05/17/2019.     ALLERGIES: No Allergies    MEDICATIONS: Aspirin 325 mg tablet  Metoprolol Succinate 50 mg tablet, extended release 24 hr  Atorvastatin Calcium 80 mg tablet  Sulfamethoxazole-Trimethoprim     GU PSH: No GU PSH      PSH Notes: lithotripsy, dental implants  c[place]age     NON-GU PSH: No Non-GU PSH    GU PMH: Acute prostatitis, The patient has acute bacterial prostatitis. I described the pathophysiology for the patient detail. We will plan to treat him for bacterial and chemical prostatitis. I cautioned him that it can take up to 4 weeks for complete resolution of his symptoms. - 2018    NON-GU PMH: No Non-GU PMH    FAMILY HISTORY: 1 Daughter - Daughter 1 son - Son copd - Father   SOCIAL HISTORY: Marital Status: Married Preferred Language: English; Ethnicity: Not Hispanic Or Latino; Race: White Current Smoking Status: Patient does not smoke anymore.   Tobacco Use Assessment Completed: Used Tobacco in last 30 days? Has never drank.  Drinks 1 caffeinated drink per day. Patient's occupation IT consultant.    REVIEW OF SYSTEMS:    GU Review Male:   Patient denies frequent urination, hard to postpone urination, burning/ pain with urination, get up at night to urinate, leakage of urine, stream starts and stops, trouble starting your stream, have to strain to urinate ,  erection problems, and penile pain.  Gastrointestinal (Upper):   Patient denies nausea, vomiting, and indigestion/ heartburn.  Gastrointestinal (Lower):   Patient denies diarrhea and constipation.  Constitutional:   Patient denies night sweats, fever, fatigue, and weight loss.  Skin:   Patient denies skin rash/ lesion and itching.  Eyes:   Patient denies blurred vision and double vision.  Ears/ Nose/ Throat:   Patient denies sore throat and sinus  problems.  Hematologic/Lymphatic:   Patient denies swollen glands and easy bruising.  Cardiovascular:   Patient denies leg swelling and chest pains.  Respiratory:   Patient denies cough and shortness of breath.  Endocrine:   Patient denies excessive thirst.  Musculoskeletal:   Patient denies back pain and joint pain.  Neurological:   Patient denies headaches and dizziness.  Psychologic:   Patient denies depression and anxiety.   VITAL SIGNS:      05/17/2019 12:46 PM  Weight 198 lb / 89.81 kg  Height 68 in / 172.72 cm  BP 124/80 mmHg  Pulse 67 /min  Temperature 98.3 F / 36.8 C  BMI 30.1 kg/m   MULTI-SYSTEM PHYSICAL EXAMINATION:    Constitutional: Well-nourished. No physical deformities. Normally developed. Good grooming.  Neck: Neck symmetrical, not swollen. Normal tracheal position.  Respiratory: No labored breathing, no use of accessory muscles.   Cardiovascular: Normal temperature, normal extremity pulses, no swelling, no varicosities.  Skin: No paleness, no jaundice, no cyanosis. No lesion, no ulcer, no rash.  Neurologic / Psychiatric: Oriented to time, oriented to place, oriented to person. No depression, no anxiety, no agitation.  Gastrointestinal: No mass, no tenderness, no rigidity, non obese abdomen. No CVA, flank, lower abdominal tenderness.  Musculoskeletal: Normal gait and station of head and neck.     PAST DATA REVIEWED:  Source Of History:  Patient, Medical Record Summary  Lab Test Review:   PSA, CBC with Diff, CMP  Records Review:   Previous Doctor Records, Previous Patient Records  Urine Test Review:   Urinalysis, Urine Culture  X-Ray Review: KUB: Reviewed Films. Reviewed Report. 11/2016 [facility] Protocol: Reviewed Films. Discussed With Patient.     05/17/19  Urinalysis  Urine Appearance Cloudy   Urine Color Amber   Urine Glucose Neg mg/dL  Urine Bilirubin Neg mg/dL  Urine Ketones Neg mg/dL  Urine Specific Gravity 1.025   Urine Blood 3+ ery/uL  Urine pH  5.5   Urine Protein Neg mg/dL  Urine Urobilinogen 0.2 mg/dL  Urine Nitrites Neg   Urine Leukocyte Esterase Neg leu/uL  Urine WBC/hpf 0 - 5/hpf   Urine RBC/hpf >60/hpf   Urine Epithelial Cells NS (Not Seen)   Urine Bacteria Few (10-25/hpf)   Urine Mucous Not Present   Urine Yeast NS (Not Seen)   Urine Trichomonas Not Present   Urine Cystals NS (Not Seen)   Urine Casts NS (Not Seen)   Urine Sperm Not Present    PROCEDURES:         C.T. Urogram - [id number]      Patient confirmed No Neulasta OnPro Device.           Urinalysis w/Scope Dipstick Dipstick Cont'd Micro  Color: Amber Bilirubin: Neg mg/dL WBC/hpf: 0 - 5/hpf  Appearance: Cloudy Ketones: Neg mg/dL RBC/hpf: >60/hpf  Specific Gravity: 1.025 Blood: 3+ ery/uL Bacteria: Few (10-25/hpf)  pH: 5.5 Protein: Neg mg/dL Cystals: NS (Not Seen)  Glucose: Neg mg/dL Urobilinogen: 0.2 mg/dL Casts: NS (Not Seen)    Nitrites: Neg Trichomonas: Not Present  Leukocyte Esterase: Neg leu/uL Mucous: Not Present      Epithelial Cells: NS (Not Seen)      Yeast: NS (Not Seen)      Sperm: Not Present    ASSESSMENT:      ICD-10 Details  1 GU:   Gross hematuria - R31.0   2   Ureteral calculus - N20.1    PLAN:            Medications New Meds: Tamsulosin Hcl 0.4 mg capsule 1 capsule PO Daily   #30  2 Refill(s)            Orders Labs Urine Culture  X-Rays: [facility] Protocol Without Contrast  X-Ray Notes: History:  Hematuria: Yes/No  Patient to see MD after exam: Yes/No  Previous exam: CT / IVP/ US/ KUB/ None  When:  Where:  Diabetic: Yes/ No  BUN/ Creatinine:  Date of last BUN Creatinine:  Weight in pounds:  Allergy- IV Contrast: Yes/ No  Conflicting diabetic meds: Yes/ No  Diabetic Meds:  Prior Authorization #: [id number]            Schedule Return Visit/Planned Activity: 1-2 Weeks - Schedule Surgery          Document Letter(s):  Created for Patient: Clinical Summary         Notes:   There is a 4.8 x 4.8  mm right distal ureteral calculi with mild to moderate upstream hydroureteronephrosis overlying the proximal portion of the right sacral wing easily seen on scout imaging today as well. 2 non-obstructing calculi observed in the left kidney as well. In hindsight, the right lower back and flank pain he had about a month ago was likely related to the calculi observed today in the distal ureter. Other than gross hematuria, patient fortunately remains asymptomatic.   Discussed ongoing management including MET utilizing a trial of tamsulosin or proceeding with a definitive intervention. Given the stone's visibility on scout imaging he would be a good candidate for ESWL. URS briefly discussed as well.   We discussed the management of urinary stones. These options include observation, ureteroscopy, shockwave lithotripsy, and PCNL. We discussed which options are relevant to these particular stones. We discussed the natural history of stones as well as the complications of untreated stones and the impact on quality of life without treatment as well as with each of the above listed treatments. We also discussed the efficacy of each treatment in its ability to clear the stone burden. With any of these management options I discussed the signs and symptoms of infection and the need for emergent treatment should these be experienced. For each option we discussed the ability of each procedure to clear the patient of their stone burden.   For observation I described the risks which include but are not limited to silent renal damage, life-threatening infection, need for emergent surgery, failure to pass stone, and pain.   For ureteroscopy I described the risks which include heart attack, stroke, pulmonary embolus, death, bleeding, infection, damage to contiguous structures, positioning injury, ureteral stricture, ureteral avulsion, ureteral injury, need for ureteral stent, inability to perform ureteroscopy, need for an  interval procedure, inability to clear stone burden, stent discomfort and pain.   For shockwave lithotripsy I described the risks which include arrhythmia, kidney contusion, kidney hemorrhage, need for transfusion, long-term risk of diabetes or hypertension, back discomfort, flank ecchymosis, flank abrasion, inability to break up stone, inability to pass stone fragments, Steinstrasse, infection associated with obstructing stones, need  for different surgical procedure, need for repeat shockwave lithotripsy, and death.   He's had shockwave lithotripsy in the past and tolerated this well. At this time, that would be his preferred modality of treatment. I'll send a message to his urologist for further review. I offered pain medication today the patient politely declined. He may use tylenol or ibuprofen in the event he develops pain/discomfort. He will begin tamsulosin to help augment any development of renal colic and aid in possible ureteral expulsion of the calculus today. Precautionary urine culture sent as well. Instructed to telephone the office with worsening pain/discomfort or worsening lower urinary tract symptoms with associated fever/chills, nausea/vomiting. If he passes the stone in the interval, he'll inform the office as well.

## 2019-05-30 NOTE — Interval H&P Note (Signed)
History and Physical Interval Note:  05/30/2019 6:47 AM  Todd Buck  has presented today for surgery, with the diagnosis of RIGHT URETERAL STONE.  The various methods of treatment have been discussed with the patient and family. After consideration of risks, benefits and other options for treatment, the patient has consented to  Procedure(s): EXTRACORPOREAL SHOCK WAVE LITHOTRIPSY (ESWL) (Right) as a surgical intervention.  The patient's history has been reviewed, patient examined, no change in status, stable for surgery.  I have reviewed the patient's chart and labs.  Questions were answered to the patient's satisfaction.     Ardis Hughs

## 2019-05-31 ENCOUNTER — Encounter (HOSPITAL_COMMUNITY): Payer: Self-pay | Admitting: Urology

## 2019-06-15 ENCOUNTER — Telehealth: Payer: Self-pay | Admitting: Cardiology

## 2019-06-15 NOTE — Telephone Encounter (Signed)
Patient called asking for a DOT letter from Dr. Raliegh Ip. He has not been seen since 2019 so I told him he would probably have to be seen but it was the doctor decision. Can you ask Dr. Raliegh Ip about this and contact him please?

## 2019-06-16 DIAGNOSIS — N201 Calculus of ureter: Secondary | ICD-10-CM | POA: Diagnosis not present

## 2019-06-16 NOTE — Telephone Encounter (Signed)
Called patient and scheduled patient to see Dr. Agustin Cree on Monday 06/20/2019

## 2019-06-16 NOTE — Telephone Encounter (Signed)
Yes, he needs to be seen in the offic e

## 2019-06-20 ENCOUNTER — Encounter: Payer: Self-pay | Admitting: *Deleted

## 2019-06-20 ENCOUNTER — Other Ambulatory Visit: Payer: Self-pay

## 2019-06-20 ENCOUNTER — Ambulatory Visit (INDEPENDENT_AMBULATORY_CARE_PROVIDER_SITE_OTHER): Payer: BC Managed Care – PPO | Admitting: Cardiology

## 2019-06-20 ENCOUNTER — Encounter: Payer: Self-pay | Admitting: Cardiology

## 2019-06-20 VITALS — BP 128/78 | HR 76 | Resp 10 | Ht 68.0 in | Wt 202.0 lb

## 2019-06-20 DIAGNOSIS — I2510 Atherosclerotic heart disease of native coronary artery without angina pectoris: Secondary | ICD-10-CM | POA: Diagnosis not present

## 2019-06-20 DIAGNOSIS — Z951 Presence of aortocoronary bypass graft: Secondary | ICD-10-CM

## 2019-06-20 DIAGNOSIS — E782 Mixed hyperlipidemia: Secondary | ICD-10-CM

## 2019-06-20 NOTE — Patient Instructions (Signed)
Medication Instructions:  Your physician recommends that you continue on your current medications as directed. Please refer to the Current Medication list given to you today.  If you need a refill on your cardiac medications before your next appointment, please call your pharmacy.   Lab work: None If you have labs (blood work) drawn today and your tests are completely normal, you will receive your results only by: Marland Kitchen MyChart Message (if you have MyChart) OR . A paper copy in the mail If you have any lab test that is abnormal or we need to change your treatment, we will call you to review the results.  Testing/Procedures: None  Follow-Up: At [facility], you and your health needs are our priority.  As part of our continuing mission to provide you with exceptional heart care, we have created designated Provider Care Teams.  These Care Teams include your primary Cardiologist (physician) and Advanced Practice Providers (APPs -  Physician Assistants and Nurse Practitioners) who all work together to provide you with the care you need, when you need it. You will need a follow up appointment in 6 months.   Any Other Special Instructions Will Be Listed Below (If Applicable).

## 2019-06-20 NOTE — Progress Notes (Signed)
Cardiology Office Note:    Date:  06/20/2019   ID:  Todd Buck, DOB Nov 14, 1956, MRN [id number]  PCP:  Shirline Frees, MD  Cardiologist:  Jenne Campus, MD    Referring MD: Shirline Frees, MD   Chief Complaint  Patient presents with  . DOT Discussion  I am doing very well  History of Present Illness:    Todd Buck is a 63 y.o. 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  He also likes to hunt.  He can walk climb stairs with absolutely no difficulty no chest pain, tightness, pressure, burning in the chest.  He is asymptomatic he is able to exercise.  Past Medical History:  Diagnosis Date  . Coronary artery disease   . GERD (gastroesophageal reflux disease)   . History of kidney stones   . Hyperlipidemia 02/15/2018  . Kidney disease, chronic, stage III (GFR 30-59 ml/min) ([place]) 02/15/2018  . Kidney stones   . Obesity (BMI 30-39.9) 02/15/2018    Past Surgical History:  Procedure Laterality Date  . COLONOSCOPY  2007  . CORONARY ARTERY BYPASS GRAFT N/A 02/17/2018   Procedure: CORONARY ARTERY BYPASS GRAFTING (CABG) x 4 WITH ENDOSCOPIC HARVESTING OF RIGHT SAPHENOUS VEIN. LIMA TO LAD, SVG TO DISTAL RCA, SVG TO DIAGONAL, SVG TO INTERMEDIUS;  Surgeon: Grace Isaac, MD;  Location: [facility];  Service: Open Heart Surgery;  Laterality: N/A;  . dental implants    . EXTRACORPOREAL SHOCK WAVE LITHOTRIPSY Right 05/30/2019   Procedure: EXTRACORPOREAL SHOCK WAVE LITHOTRIPSY (ESWL);  Surgeon: Ardis Hughs, MD;  Location: [facility];  Service: Urology;  Laterality: Right;  . LASIK     EYES  . LEFT HEART CATH AND CORONARY ANGIOGRAPHY N/A 02/16/2018   Procedure: LEFT HEART CATH AND CORONARY ANGIOGRAPHY;  Surgeon: Leonie Man, MD;  Location: [facility];  Service: Cardiovascular;  Laterality: N/A;  . LITHOTRIPSY    . POLYPECTOMY    . TEE WITHOUT CARDIOVERSION N/A 02/17/2018   Procedure: TRANSESOPHAGEAL ECHOCARDIOGRAM (TEE);  Surgeon: Grace Isaac, MD;  Location: [facility];  Service: Open Heart Surgery;  Laterality: N/A;    Current Medications: Current Meds  Medication Sig  . aspirin EC 325 MG EC tablet Take 1 tablet (325 mg total) by mouth daily.  Marland Kitchen atorvastatin (LIPITOR) 80 MG tablet Take 1 tablet (80 mg total) by mouth daily at 6 PM.  . metoprolol succinate (TOPROL-XL) 50 MG 24 hr tablet Take 1 tablet (50 mg total) by mouth daily. Take with or immediately following a meal.     Allergies:   Patient has no known allergies.   Social History   Socioeconomic History  . Marital status: Married    Spouse name: Not on file  . Number of children: Not on file  . Years of education: Not on file  . Highest education level: Not on file  Occupational History  . Not on file  Social Needs  . Financial resource strain: Not on file  . Food insecurity    Worry: Not on file    Inability: Not on file  . Transportation needs    Medical: Not on file    Non-medical: Not on file  Tobacco Use  . Smoking status: Former Research scientist (life sciences)  . Smokeless tobacco:  Never Used  . Tobacco comment: 40 years ago  Substance and Sexual Activity  . Alcohol use: No  . Drug use: No  . Sexual activity: Yes  Lifestyle  . Physical activity    Days per week: Not on file    Minutes per session: Not on file  . Stress: Not on file  Relationships  . Social Herbalist on phone: Not on file    Gets together: Not on file    Attends religious service: Not on file    Active member of club or organization: Not on file    Attends meetings of clubs or organizations: Not on file    Relationship status: Not on file  Other Topics Concern  . Not on file  Social History Narrative  . Not on file     Family History: The patient's family history includes Heart  disease in his father and mother; Hyperlipidemia in his sister and sister; Renal Disease in his sister. There is no history of Colon cancer. ROS:   Please see the history of present illness.    All 14 point review of systems negative except as described per history of present illness  EKGs/Labs/Other Studies Reviewed:      Recent Labs: 01/13/2019: ALT 31  Recent Lipid Panel    Component Value Date/Time   CHOL 121 01/13/2019 0905   TRIG 85 01/13/2019 0905   HDL 42 01/13/2019 0905   CHOLHDL 2.9 01/13/2019 0905   CHOLHDL 5.2 02/15/2018 1646   VLDL 50 (H) 02/15/2018 1646   LDLCALC 62 01/13/2019 0905    Physical Exam:    VS:  BP 128/78   Pulse 76   Resp 10   Ht 5\' 8"  (1.727 m)   Wt 202 lb (91.6 kg)   BMI 30.71 kg/m     Wt Readings from Last 3 Encounters:  06/20/19 202 lb (91.6 kg)  05/30/19 200 lb (90.7 kg)  10/05/18 194 lb 1.9 oz (88.1 kg)     GEN:  Well nourished, well developed in no acute distress HEENT: Normal NECK: No JVD; No carotid bruits LYMPHATICS: No lymphadenopathy CARDIAC: RRR, no murmurs, no rubs, no gallops RESPIRATORY:  Clear to auscultation without rales, wheezing or rhonchi  ABDOMEN: Soft, non-tender, non-distended MUSCULOSKELETAL:  No edema; No deformity  SKIN: Warm and dry LOWER EXTREMITIES: no swelling NEUROLOGIC:  Alert and oriented x 3 PSYCHIATRIC:  Normal affect   ASSESSMENT:    1. S/P CABG x 4   2. Mixed hyperlipidemia   3. Coronary artery disease involving native coronary artery of native heart without angina pectoris    PLAN:    In order of problems listed above:  1. Status post coronary artery bypass graft overall he is doing great.  Asymptomatic very active.  Stable 2. Mixed dyslipidemia last fasting lipid profile from 5 months ago which was excellent we will continue present management.  He is on high intensity statin Lipitor 80 mg which I will continue. 3. Coronary artery disease.  Noted status post bypass surgery on aspirin  which I will continue.  The dose will be reduced to 81 mg daily.  Overall stable from cardiac standpoint of view.  Should be fine to be driving.  We did retrieve stress test from Dr. Thurman Coyer office that was done last year.  Which was negative.   Medication Adjustments/Labs and Tests Ordered: Current medicines are reviewed at length with the patient today.  Concerns regarding medicines are outlined above.  No  orders of the defined types were placed in this encounter.  Medication changes: No orders of the defined types were placed in this encounter.   Signed, Park Liter, MD, [facility] 06/20/2019 8:55 AM    [place]

## 2019-07-05 ENCOUNTER — Other Ambulatory Visit: Payer: Self-pay | Admitting: Nurse Practitioner

## 2019-07-05 ENCOUNTER — Other Ambulatory Visit (HOSPITAL_COMMUNITY): Payer: Self-pay | Admitting: Nurse Practitioner

## 2019-07-05 DIAGNOSIS — Q446 Cystic disease of liver: Secondary | ICD-10-CM

## 2019-07-08 ENCOUNTER — Other Ambulatory Visit: Payer: Self-pay

## 2019-07-08 ENCOUNTER — Ambulatory Visit (HOSPITAL_COMMUNITY)
Admission: RE | Admit: 2019-07-08 | Discharge: 2019-07-08 | Disposition: A | Discharge status: Home / Self Care | Payer: BC Managed Care – PPO | Source: Ambulatory Visit | Attending: Nurse Practitioner | Admitting: Nurse Practitioner

## 2019-07-08 DIAGNOSIS — D134 Benign neoplasm of liver: Secondary | ICD-10-CM | POA: Diagnosis not present

## 2019-07-08 DIAGNOSIS — Q446 Cystic disease of liver: Secondary | ICD-10-CM | POA: Diagnosis not present

## 2019-07-08 LAB — POCT I-STAT CREATININE: Creatinine, Ser: 1.6 mg/dL — ABNORMAL HIGH (ref 0.61–1.24)

## 2019-07-08 MED ORDER — GADOBUTROL 1 MMOL/ML IV SOLN
9.0000 mL | Freq: Once | INTRAVENOUS | Status: AC | PRN
  Administered 2019-07-08: 9 mL via INTRAVENOUS

## 2019-07-14 DIAGNOSIS — N202 Calculus of kidney with calculus of ureter: Secondary | ICD-10-CM | POA: Diagnosis not present

## 2019-08-21 ENCOUNTER — Other Ambulatory Visit: Payer: Self-pay | Admitting: Cardiology

## 2019-10-04 DIAGNOSIS — N201 Calculus of ureter: Secondary | ICD-10-CM | POA: Diagnosis not present

## 2019-11-14 DIAGNOSIS — L814 Other melanin hyperpigmentation: Secondary | ICD-10-CM | POA: Diagnosis not present

## 2019-11-14 DIAGNOSIS — D225 Melanocytic nevi of trunk: Secondary | ICD-10-CM | POA: Diagnosis not present

## 2019-11-14 DIAGNOSIS — L821 Other seborrheic keratosis: Secondary | ICD-10-CM | POA: Diagnosis not present

## 2019-11-25 ENCOUNTER — Telehealth (HOSPITAL_COMMUNITY): Payer: Self-pay | Admitting: *Deleted

## 2019-11-25 NOTE — Telephone Encounter (Signed)
Left message on voicemail per DPR in reference to upcoming appointment scheduled on 11/28/19 at 7:30 with detailed instructions given per Myocardial Perfusion Study Information Sheet for the test. LM to arrive 15 minutes early, and that it is imperative to arrive on time for appointment to keep from having the test rescheduled. If you need to cancel or reschedule your appointment, please call the office within 24 hours of your appointment. Failure to do so may result in a cancellation of your appointment, and a $50 no show fee. Phone number given for call back for any questions.

## 2019-11-28 ENCOUNTER — Ambulatory Visit (HOSPITAL_COMMUNITY): Payer: BC Managed Care – PPO | Attending: Cardiology

## 2019-11-28 ENCOUNTER — Other Ambulatory Visit: Payer: Self-pay

## 2019-11-28 VITALS — Ht 68.0 in | Wt 202.0 lb

## 2019-11-28 DIAGNOSIS — I2510 Atherosclerotic heart disease of native coronary artery without angina pectoris: Secondary | ICD-10-CM | POA: Insufficient documentation

## 2019-11-28 DIAGNOSIS — Z951 Presence of aortocoronary bypass graft: Secondary | ICD-10-CM

## 2019-11-28 LAB — MYOCARDIAL PERFUSION IMAGING
LV dias vol: 120 mL (ref 62–150)
LV sys vol: 65 mL
Peak HR: 101 {beats}/min
Rest HR: 58 {beats}/min
SDS: 4
SRS: 0
SSS: 4
TID: 0.94

## 2019-11-28 MED ORDER — REGADENOSON 0.4 MG/5ML IV SOLN
0.4000 mg | Freq: Once | INTRAVENOUS | Status: AC
  Administered 2019-11-28: 0.4 mg via INTRAVENOUS

## 2019-11-28 MED ORDER — TECHNETIUM TC 99M TETROFOSMIN IV KIT
31.5000 | PACK | Freq: Once | INTRAVENOUS | Status: AC | PRN
  Administered 2019-11-28: 31.5 via INTRAVENOUS
  Filled 2019-11-28: qty 32

## 2019-11-28 MED ORDER — TECHNETIUM TC 99M TETROFOSMIN IV KIT
10.7000 | PACK | Freq: Once | INTRAVENOUS | Status: AC | PRN
  Administered 2019-11-28: 10.7 via INTRAVENOUS
  Filled 2019-11-28: qty 11

## 2019-12-05 ENCOUNTER — Telehealth: Payer: Self-pay | Admitting: Emergency Medicine

## 2019-12-05 DIAGNOSIS — R9430 Abnormal result of cardiovascular function study, unspecified: Secondary | ICD-10-CM

## 2019-12-05 NOTE — Telephone Encounter (Signed)
Called patient informed him of stress test results and recommendation for patient to have a echocardiogram. Patient scheduled for echocardiogram, called wife and gave her appointment per patients request.

## 2019-12-08 ENCOUNTER — Ambulatory Visit (HOSPITAL_BASED_OUTPATIENT_CLINIC_OR_DEPARTMENT_OTHER)
Admission: RE | Admit: 2019-12-08 | Discharge: 2019-12-08 | Disposition: A | Discharge status: Home / Self Care | Payer: BC Managed Care – PPO | Source: Ambulatory Visit | Attending: Cardiology | Admitting: Cardiology

## 2019-12-08 ENCOUNTER — Other Ambulatory Visit: Payer: Self-pay

## 2019-12-08 DIAGNOSIS — R9430 Abnormal result of cardiovascular function study, unspecified: Secondary | ICD-10-CM | POA: Insufficient documentation

## 2019-12-08 NOTE — Progress Notes (Signed)
  Echocardiogram 2D Echocardiogram has been performed.  Cardell Peach 12/08/2019, 9:50 AM

## 2019-12-12 ENCOUNTER — Ambulatory Visit (INDEPENDENT_AMBULATORY_CARE_PROVIDER_SITE_OTHER): Payer: BC Managed Care – PPO | Admitting: Cardiology

## 2019-12-12 ENCOUNTER — Other Ambulatory Visit: Payer: Self-pay

## 2019-12-12 ENCOUNTER — Encounter: Payer: Self-pay | Admitting: Cardiology

## 2019-12-12 VITALS — BP 142/82 | HR 71 | Ht 68.0 in | Wt 205.0 lb

## 2019-12-12 DIAGNOSIS — I2510 Atherosclerotic heart disease of native coronary artery without angina pectoris: Secondary | ICD-10-CM

## 2019-12-12 DIAGNOSIS — Z951 Presence of aortocoronary bypass graft: Secondary | ICD-10-CM

## 2019-12-12 DIAGNOSIS — E782 Mixed hyperlipidemia: Secondary | ICD-10-CM

## 2019-12-12 NOTE — Progress Notes (Signed)
Cardiology Office Note:    Date:  12/12/2019   ID:  Todd Buck, DOB 18-Jul-1956, MRN [id number]  PCP:  Shirline Frees, MD  Cardiologist:  Jenne Campus, MD    Referring MD: Shirline Frees, MD   Chief Complaint  Patient presents with  . Follow-up    6 MO FU   Doing very well  History of Present Illness:    Todd Buck is a 63 y.o. male with coronary disease, status post coronary to bypass graft 2 years ago.  Comes today to my office for follow-up.  Overall doing great.  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  When asked him to compare himself today with himself a year ago he said he is doing the same.  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  Stress that showed surprisingly mildly diminished ejection fraction, after that he got echocardiogram done which showed preserved left ventricle ejection fraction.  Past Medical History:  Diagnosis Date  . Coronary artery disease   . GERD (gastroesophageal reflux disease)   . History of kidney stones   . Hyperlipidemia 02/15/2018  . Kidney disease, chronic, stage III (GFR 30-59 ml/min) 02/15/2018  . Kidney stones   . Obesity (BMI 30-39.9) 02/15/2018    Past Surgical History:  Procedure Laterality Date  . COLONOSCOPY  2007  . CORONARY ARTERY BYPASS GRAFT N/A 02/17/2018   Procedure: CORONARY ARTERY BYPASS GRAFTING (CABG) x 4 WITH ENDOSCOPIC HARVESTING OF RIGHT SAPHENOUS VEIN. LIMA TO LAD, SVG TO DISTAL RCA, SVG TO DIAGONAL, SVG TO INTERMEDIUS;  Surgeon: Grace Isaac, MD;  Location: [facility];  Service: Open Heart Surgery;  Laterality: N/A;  . dental implants    . EXTRACORPOREAL SHOCK WAVE LITHOTRIPSY Right 05/30/2019   Procedure: EXTRACORPOREAL SHOCK WAVE LITHOTRIPSY (ESWL);  Surgeon: Ardis Hughs, MD;  [facility];  Service: Urology;  Laterality: Right;  . LASIK     EYES  . LEFT HEART CATH AND CORONARY ANGIOGRAPHY N/A 02/16/2018   Procedure: LEFT HEART CATH  AND CORONARY ANGIOGRAPHY;  Surgeon: Leonie Man, MD;  Location: [place] [facility];  Service: Cardiovascular;  Laterality: N/A;  . LITHOTRIPSY    . POLYPECTOMY    . TEE WITHOUT CARDIOVERSION N/A 02/17/2018   Procedure: TRANSESOPHAGEAL ECHOCARDIOGRAM (TEE);  Surgeon: Grace Isaac, MD;  Location: [facility];  Service: Open Heart Surgery;  Laterality: N/A;    Current Medications: Current Meds  Medication Sig  . aspirin EC 325 MG EC tablet Take 1 tablet (325 mg total) by mouth daily.  Marland Kitchen atorvastatin (LIPITOR) 80 MG tablet Take 80 mg by mouth daily.  . metoprolol succinate (TOPROL-XL) 50 MG 24 hr tablet Take 50 mg by mouth daily. Take with or immediately following a meal.  . [DISCONTINUED] atorvastatin (LIPITOR) 80 MG tablet TAKE 1 TABLET(80 MG) BY MOUTH DAILY AT 6 PM     Allergies:   Patient has no known allergies.   Social History   Socioeconomic History  . Marital status: Married    Spouse name: Not on file  . Number of children: Not on file  . Years of education: Not on file  . Highest education level: Not on file  Occupational History  . Not on file  Tobacco Use  . Smoking status: Former Research scientist (life sciences)  . Smokeless tobacco: Never Used  . Tobacco comment: 40 years ago  Substance and Sexual Activity  . Alcohol use: No  .  Drug use: No  . Sexual activity: Yes  Other Topics Concern  . Not on file  Social History Narrative  . Not on file   Social Determinants of Health   Financial Resource Strain:   . Difficulty of Paying Living Expenses: Not on file  Food Insecurity:   . Worried About Charity fundraiser in the Last Year: Not on file  . Ran Out of Food in the Last Year: Not on file  Transportation Needs:   . Lack of Transportation (Medical): Not on file  . Lack of Transportation (Non-Medical): Not on file  Physical Activity:   . Days of Exercise per Week: Not on file  . Minutes of Exercise per Session: Not on file  Stress:   . Feeling of Stress : Not on file  Social  Connections:   . Frequency of Communication with Friends and Family: Not on file  . Frequency of Social Gatherings with Friends and Family: Not on file  . Attends Religious Services: Not on file  . Active Member of Clubs or Organizations: Not on file  . Attends Archivist Meetings: Not on file  . Marital Status: Not on file     Family History: The patient's family history includes Heart disease in his father and mother; Hyperlipidemia in his sister and sister; Renal Disease in his sister. There is no history of Colon cancer. ROS:   Please see the history of present illness.    All 14 point review of systems negative except as described per history of present illness  EKGs/Labs/Other Studies Reviewed:      Recent Labs: 01/13/2019: ALT 31 07/08/2019: Creatinine, Ser 1.60  Recent Lipid Panel    Component Value Date/Time   CHOL 121 01/13/2019 0905   TRIG 85 01/13/2019 0905   HDL 42 01/13/2019 0905   CHOLHDL 2.9 01/13/2019 0905   CHOLHDL 5.2 02/15/2018 1646   VLDL 50 (H) 02/15/2018 1646   LDLCALC 62 01/13/2019 0905    Physical Exam:    VS:  BP (!) 142/82   Pulse 71   Ht 5\' 8"  (1.727 m)   Wt 205 lb (93 kg)   SpO2 99%   BMI 31.17 kg/m     Wt Readings from Last 3 Encounters:  12/12/19 205 lb (93 kg)  11/28/19 202 lb (91.6 kg)  06/20/19 202 lb (91.6 kg)     GEN:  Well nourished, well developed in no acute distress HEENT: Normal NECK: No JVD; No carotid bruits LYMPHATICS: No lymphadenopathy CARDIAC: RRR, no murmurs, no rubs, no gallops RESPIRATORY:  Clear to auscultation without rales, wheezing or rhonchi  ABDOMEN: Soft, non-tender, non-distended MUSCULOSKELETAL:  No edema; No deformity  SKIN: Warm and dry LOWER EXTREMITIES: no swelling NEUROLOGIC:  Alert and oriented x 3 PSYCHIATRIC:  Normal affect   ASSESSMENT:    1. S/P CABG x 4   2. Mixed hyperlipidemia   3. Coronary artery disease involving native coronary artery of native heart without angina  pectoris    PLAN:    In order of problems listed above:  1. Status post coronary bypass graft doing well from that point review. 2. Mixed dyslipidemia fasting lipid profile will be done today. 3. Coronary disease stable from that point of view.   Medication Adjustments/Labs and Tests Ordered: Current medicines are reviewed at length with the patient today.  Concerns regarding medicines are outlined above.  Orders Placed This Encounter  Procedures  . Lipid Profile   Medication changes: No orders  of the defined types were placed in this encounter.   Signed, Park Liter, MD, [facility] 12/12/2019 9:47 AM    [place]

## 2019-12-12 NOTE — Addendum Note (Signed)
Addended by: Gar Ponto on: 12/12/2019 09:56 AM   Modules accepted: Orders

## 2019-12-12 NOTE — Patient Instructions (Signed)
Medication Instructions:  No changes *If you need a refill on your cardiac medications before your next appointment, please call your pharmacy*  Lab Work: Fasting lipids today If you have labs (blood work) drawn today and your tests are completely normal, you will receive your results only by: Marland Kitchen MyChart Message (if you have MyChart) OR . A paper copy in the mail If you have any lab test that is abnormal or we need to change your treatment, we will call you to review the results.  Testing/Procedures: None today  Follow-Up: At [facility], you and your health needs are our priority.  As part of our continuing mission to provide you with exceptional heart care, we have created designated Provider Care Teams.  These Care Teams include your primary Cardiologist (physician) and Advanced Practice Providers (APPs -  Physician Assistants and Nurse Practitioners) who all work together to provide you with the care you need, when you need it.  Your next appointment:   6 month(s)  The format for your next appointment:   In Person  Provider:   Jenne Campus, MD  Other Instructions None today

## 2019-12-13 LAB — LIPID PANEL
Chol/HDL Ratio: 3.2 ratio (ref 0.0–5.0)
Cholesterol, Total: 148 mg/dL (ref 100–199)
HDL: 46 mg/dL (ref >39)
LDL Chol Calc (NIH): 81 mg/dL (ref 0–99)
Triglycerides: 119 mg/dL (ref 0–149)
VLDL Cholesterol Cal: 21 mg/dL (ref 5–40)

## 2019-12-14 ENCOUNTER — Telehealth: Payer: Self-pay | Admitting: Emergency Medicine

## 2019-12-14 DIAGNOSIS — E785 Hyperlipidemia, unspecified: Secondary | ICD-10-CM

## 2019-12-14 MED ORDER — EZETIMIBE 10 MG PO TABS: 10.0000 mg | ORAL_TABLET | Freq: Every day | ORAL | 2 refills | Status: DC

## 2019-12-14 NOTE — Telephone Encounter (Signed)
Spoke to patient's wife per dpr. Informed her of patient's lab results and advised her per Dr. Agustin Cree for patient to start zetia 10 mg daily and have fasting labs drawn in 6 weeks. She verbally understood, no further questions.

## 2020-01-11 ENCOUNTER — Other Ambulatory Visit: Payer: Self-pay | Admitting: Cardiology

## 2020-02-01 DIAGNOSIS — E785 Hyperlipidemia, unspecified: Secondary | ICD-10-CM | POA: Diagnosis not present

## 2020-02-02 LAB — LIPID PANEL
Chol/HDL Ratio: 2.4 ratio (ref 0.0–5.0)
Cholesterol, Total: 115 mg/dL (ref 100–199)
HDL: 47 mg/dL (ref >39)
LDL Chol Calc (NIH): 48 mg/dL (ref 0–99)
Triglycerides: 107 mg/dL (ref 0–149)
VLDL Cholesterol Cal: 20 mg/dL (ref 5–40)

## 2020-02-02 LAB — HEPATIC FUNCTION PANEL
ALT: 36 IU/L (ref 0–44)
AST: 27 IU/L (ref 0–40)
Albumin: 4.1 g/dL (ref 3.8–4.8)
Alkaline Phosphatase: 75 IU/L (ref 39–117)
Bilirubin Total: 0.9 mg/dL (ref 0.0–1.2)
Bilirubin, Direct: 0.21 mg/dL (ref 0.00–0.40)
Total Protein: 6.6 g/dL (ref 6.0–8.5)

## 2020-02-03 ENCOUNTER — Encounter: Payer: Self-pay | Admitting: *Deleted

## 2020-02-16 ENCOUNTER — Other Ambulatory Visit: Payer: Self-pay | Admitting: Cardiology

## 2020-02-16 NOTE — Telephone Encounter (Signed)
Rx refill sent to pharmacy. 

## 2020-02-23 ENCOUNTER — Ambulatory Visit: Payer: BC Managed Care – PPO | Attending: Family

## 2020-02-23 DIAGNOSIS — Z23 Encounter for immunization: Secondary | ICD-10-CM

## 2020-02-23 NOTE — Progress Notes (Signed)
   Covid-19 Vaccination Clinic  Name:  Todd Buck    MRN: [id number] DOB: 1956-10-06  02/23/2020  [name] was observed post Covid-19 immunization for 15 minutes without incident. He was provided with Vaccine Information Sheet and instruction to access the V-Safe system.   [name] was instructed to call 911 with any severe reactions post vaccine: Marland Kitchen Difficulty breathing  . Swelling of face and throat  . A fast heartbeat  . A bad rash all over body  . Dizziness and weakness   Immunizations Administered    Name Date Dose VIS Date Route   Moderna COVID-19 Vaccine 02/23/2020  3:48 PM 0.5 mL 11/15/2019 Intramuscular   Manufacturer: Moderna   Lot: [id number]   [place]:[id number]

## 2020-03-07 ENCOUNTER — Other Ambulatory Visit: Payer: Self-pay | Admitting: Cardiology

## 2020-03-27 ENCOUNTER — Ambulatory Visit: Payer: BC Managed Care – PPO | Attending: Family

## 2020-03-27 DIAGNOSIS — Z23 Encounter for immunization: Secondary | ICD-10-CM

## 2020-03-27 NOTE — Progress Notes (Signed)
   Covid-19 Vaccination Clinic  Name:  Todd Buck    MRN: [id number] DOB: [birth year]  03/27/2020  [name] was observed post Covid-19 immunization for 15 minutes without incident. He was provided with Vaccine Information Sheet and instruction to access the V-Safe system.   [name] was instructed to call 911 with any severe reactions post vaccine: Marland Kitchen Difficulty breathing  . Swelling of face and throat  . A fast heartbeat  . A bad rash all over body  . Dizziness and weakness   Immunizations Administered    Name Date Dose VIS Date Route   Moderna COVID-19 Vaccine 03/27/2020  1:11 PM 0.5 mL 11/15/2019 Intramuscular   Manufacturer: Moderna   Lot: [id number]   [place]:[id number]

## 2020-05-16 ENCOUNTER — Encounter: Payer: Self-pay | Admitting: Cardiology

## 2020-06-19 ENCOUNTER — Ambulatory Visit: Payer: BC Managed Care – PPO | Admitting: Cardiology

## 2020-06-22 ENCOUNTER — Ambulatory Visit (INDEPENDENT_AMBULATORY_CARE_PROVIDER_SITE_OTHER): Payer: BC Managed Care – PPO | Admitting: Cardiology

## 2020-06-22 ENCOUNTER — Encounter: Payer: Self-pay | Admitting: Cardiology

## 2020-06-22 ENCOUNTER — Other Ambulatory Visit: Payer: Self-pay

## 2020-06-22 VITALS — BP 156/88 | HR 62 | Ht 68.0 in | Wt 206.0 lb

## 2020-06-22 DIAGNOSIS — Z951 Presence of aortocoronary bypass graft: Secondary | ICD-10-CM | POA: Diagnosis not present

## 2020-06-22 DIAGNOSIS — E782 Mixed hyperlipidemia: Secondary | ICD-10-CM | POA: Diagnosis not present

## 2020-06-22 DIAGNOSIS — I2510 Atherosclerotic heart disease of native coronary artery without angina pectoris: Secondary | ICD-10-CM | POA: Diagnosis not present

## 2020-06-22 NOTE — Progress Notes (Signed)
Cardiology Office Note:    Date:  06/22/2020   ID:  Todd Buck, DOB Jul 12, 1956, MRN [id number]  PCP:  Shirline Frees, MD  Cardiologist:  Jenne Campus, MD    Referring MD: Shirline Frees, MD   No chief complaint on file. I am doing fine  History of Present Illness:    Todd Buck is a 64 y.o. male with past medical history significant for coronary artery disease, status post coronary bypass graft 2 years ago, normal MI, dyslipidemia.  Comes today 2 months of follow-up.  Overall doing great cardiac wise denies have any chest pain tightness squeezing pressure burning chest.  He is upset about the fact that he had to wear a mask but overall doing well.  Past Medical History:  Diagnosis Date  . Coronary artery disease   . GERD (gastroesophageal reflux disease)   . History of kidney stones   . Hyperlipidemia 02/15/2018  . Kidney disease, chronic, stage III (GFR 30-59 ml/min) 02/15/2018  . Kidney stones   . Obesity (BMI 30-39.9) 02/15/2018    Past Surgical History:  Procedure Laterality Date  . COLONOSCOPY  2007  . CORONARY ARTERY BYPASS GRAFT N/A 02/17/2018   Procedure: CORONARY ARTERY BYPASS GRAFTING (CABG) x 4 WITH ENDOSCOPIC HARVESTING OF RIGHT SAPHENOUS VEIN. LIMA TO LAD, SVG TO DISTAL RCA, SVG TO DIAGONAL, SVG TO INTERMEDIUS;  Surgeon: Grace Isaac, MD;  Location: [facility];  Service: Open Heart Surgery;  Laterality: N/A;  . dental implants    . EXTRACORPOREAL SHOCK WAVE LITHOTRIPSY Right 05/30/2019   Procedure: EXTRACORPOREAL SHOCK WAVE LITHOTRIPSY (ESWL);  Surgeon: Ardis Hughs, MD;  Location[facility];  Service: Urology;  Laterality: Right;  . LASIK     EYES  . LEFT HEART CATH AND CORONARY ANGIOGRAPHY N/A 02/16/2018   Procedure: LEFT HEART CATH AND CORONARY ANGIOGRAPHY;  Surgeon: Leonie Man, MD;  Location: [place] [facility];  Service: Cardiovascular;  Laterality: N/A;  . LITHOTRIPSY    . POLYPECTOMY    . TEE WITHOUT CARDIOVERSION N/A 02/17/2018   Procedure:  TRANSESOPHAGEAL ECHOCARDIOGRAM (TEE);  Surgeon: Grace Isaac, MD;  Location: [facility];  Service: Open Heart Surgery;  Laterality: N/A;    Current Medications: Current Meds  Medication Sig  . aspirin EC 325 MG EC tablet Take 1 tablet (325 mg total) by mouth daily.  Marland Kitchen atorvastatin (LIPITOR) 80 MG tablet TAKE 1 TABLET(80 MG) BY MOUTH DAILY AT 6 PM  . ezetimibe (ZETIA) 10 MG tablet TAKE 1 TABLET(10 MG) BY MOUTH DAILY  . metoprolol succinate (TOPROL-XL) 50 MG 24 hr tablet TAKE 1 TABLET BY MOUTH DAILY. TAKE WITH OR IMMEDIATELY FOLLOWING A MEAL     Allergies:   Patient has no known allergies.   Social History   Socioeconomic History  . Marital status: Married    Spouse name: Not on file  . Number of children: Not on file  . Years of education: Not on file  . Highest education level: Not on file  Occupational History  . Not on file  Tobacco Use  . Smoking status: Former Research scientist (life sciences)  . Smokeless tobacco: Never Used  . Tobacco comment: 40 years ago  Vaping Use  . Vaping Use: Never used  Substance and Sexual Activity  . Alcohol use: No  . Drug use: No  . Sexual activity: Yes  Other Topics Concern  . Not on file  Social History Narrative  . Not on file   Social Determinants of Health  Financial Resource Strain:   . Difficulty of Paying Living Expenses:   Food Insecurity:   . Worried About Charity fundraiser in the Last Year:   . Arboriculturist in the Last Year:   Transportation Needs:   . Film/video editor (Medical):   Marland Kitchen Lack of Transportation (Non-Medical):   Physical Activity:   . Days of Exercise per Week:   . Minutes of Exercise per Session:   Stress:   . Feeling of Stress :   Social Connections:   . Frequency of Communication with Friends and Family:   . Frequency of Social Gatherings with Friends and Family:   . Attends Religious Services:   . Active Member of Clubs or Organizations:   . Attends Archivist Meetings:   Marland Kitchen Marital Status:       Family History: The patient's family history includes Heart disease in his father and mother; Hyperlipidemia in his sister and sister; Renal Disease in his sister. There is no history of Colon cancer. ROS:   Please see the history of present illness.    All 14 point review of systems negative except as described per history of present illness  EKGs/Labs/Other Studies Reviewed:      Recent Labs: 07/08/2019: Creatinine, Ser 1.60 02/01/2020: ALT 36  Recent Lipid Panel    Component Value Date/Time   CHOL 115 02/01/2020 0835   TRIG 107 02/01/2020 0835   HDL 47 02/01/2020 0835   CHOLHDL 2.4 02/01/2020 0835   CHOLHDL 5.2 02/15/2018 1646   VLDL 50 (H) 02/15/2018 1646   LDLCALC 48 02/01/2020 0835    Physical Exam:    VS:  BP (!) 156/88 (BP Location: Right Arm, Patient Position: Sitting, Cuff Size: Normal)   Pulse 62   Ht 5\' 8"  (1.727 m)   Wt 206 lb (93.4 kg)   SpO2 96%   BMI 31.32 kg/m     Wt Readings from Last 3 Encounters:  06/22/20 206 lb (93.4 kg)  12/12/19 205 lb (93 kg)  11/28/19 202 lb (91.6 kg)     GEN:  Well nourished, well developed in no acute distress HEENT: Normal NECK: No JVD; No carotid bruits LYMPHATICS: No lymphadenopathy CARDIAC: RRR, no murmurs, no rubs, no gallops RESPIRATORY:  Clear to auscultation without rales, wheezing or rhonchi  ABDOMEN: Soft, non-tender, non-distended MUSCULOSKELETAL:  No edema; No deformity  SKIN: Warm and dry LOWER EXTREMITIES: no swelling NEUROLOGIC:  Alert and oriented x 3 PSYCHIATRIC:  Normal affect   ASSESSMENT:    1. Mixed hyperlipidemia   2. Coronary artery disease involving native coronary artery of native heart without angina pectoris   3. S/P CABG x 4    PLAN:    In order of problems listed above:  1. Mixed dyslipidemia.  I will ask you to have fasting lipid profile done.  He is already on high intensity statin and Zetia which I will continue. 2. Coronary disease, status post coronary bypass graft seems  to be stable from that point review.  I did review stress test from few months ago which was negative surprisingly showed diminished ejection fraction by echocardiogram after that showed preserved left ventricle ejection fraction. 3. We talked about healthy lifestyle need to exercise on a regular basis good diet.  He was also informed about medications and explained to him why he takes those medications he is wanted to stop all medications and see how things will go and I advised him against that.  Medication Adjustments/Labs and Tests Ordered: Current medicines are reviewed at length with the patient today.  Concerns regarding medicines are outlined above.  Orders Placed This Encounter  Procedures  . Lipid panel  . Hepatic function panel   Medication changes: No orders of the defined types were placed in this encounter.   Signed, Park Liter, MD, [facility] 06/22/2020 8:46 AM    [place]

## 2020-06-22 NOTE — Patient Instructions (Signed)
Medication Instructions:  Your physician recommends that you continue on your current medications as directed. Please refer to the Current Medication list given to you today.  *If you need a refill on your cardiac medications before your next appointment, please call your pharmacy*   Lab Work: Your physician recommends that you return for lab work today: lft, lipids   If you have labs (blood work) drawn today and your tests are completely normal, you will receive your results only by: Marland Kitchen MyChart Message (if you have MyChart) OR . A paper copy in the mail If you have any lab test that is abnormal or we need to change your treatment, we will call you to review the results.   Testing/Procedures: None.    Follow-Up: At [facility], you and your health needs are our priority.  As part of our continuing mission to provide you with exceptional heart care, we have created designated Provider Care Teams.  These Care Teams include your primary Cardiologist (physician) and Advanced Practice Providers (APPs -  Physician Assistants and Nurse Practitioners) who all work together to provide you with the care you need, when you need it.  We recommend signing up for the patient portal called "MyChart".  Sign up information is provided on this After Visit Summary.  MyChart is used to connect with patients for Virtual Visits (Telemedicine).  Patients are able to view lab/test results, encounter notes, upcoming appointments, etc.  Non-urgent messages can be sent to your provider as well.   To learn more about what you can do with MyChart, go to [email or web address].    Your next appointment:   6 month(s)  The format for your next appointment:   In Person  Provider:   Jenne Campus, MD   Other Instructions

## 2020-06-23 LAB — LIPID PANEL
Chol/HDL Ratio: 2.7 ratio (ref 0.0–5.0)
Cholesterol, Total: 108 mg/dL (ref 100–199)
HDL: 40 mg/dL (ref >39)
LDL Chol Calc (NIH): 50 mg/dL (ref 0–99)
Triglycerides: 91 mg/dL (ref 0–149)
VLDL Cholesterol Cal: 18 mg/dL (ref 5–40)

## 2020-06-23 LAB — HEPATIC FUNCTION PANEL
ALT: 34 IU/L (ref 0–44)
AST: 25 IU/L (ref 0–40)
Albumin: 4.4 g/dL (ref 3.8–4.8)
Alkaline Phosphatase: 75 IU/L (ref 48–121)
Bilirubin Total: 0.9 mg/dL (ref 0.0–1.2)
Bilirubin, Direct: 0.26 mg/dL (ref 0.00–0.40)
Total Protein: 6.5 g/dL (ref 6.0–8.5)

## 2020-07-11 DIAGNOSIS — N183 Chronic kidney disease, stage 3 unspecified: Secondary | ICD-10-CM | POA: Diagnosis not present

## 2020-07-11 DIAGNOSIS — I2510 Atherosclerotic heart disease of native coronary artery without angina pectoris: Secondary | ICD-10-CM | POA: Diagnosis not present

## 2020-07-11 DIAGNOSIS — E7800 Pure hypercholesterolemia, unspecified: Secondary | ICD-10-CM | POA: Diagnosis not present

## 2020-07-11 DIAGNOSIS — Z125 Encounter for screening for malignant neoplasm of prostate: Secondary | ICD-10-CM | POA: Diagnosis not present

## 2020-07-11 DIAGNOSIS — Z0000 Encounter for general adult medical examination without abnormal findings: Secondary | ICD-10-CM | POA: Diagnosis not present

## 2020-08-14 ENCOUNTER — Other Ambulatory Visit: Payer: Self-pay | Admitting: Cardiology

## 2020-08-15 ENCOUNTER — Other Ambulatory Visit: Payer: Self-pay | Admitting: Cardiology

## 2020-08-23 ENCOUNTER — Other Ambulatory Visit: Payer: Self-pay | Admitting: Cardiology

## 2020-08-23 MED ORDER — METOPROLOL SUCCINATE ER 50 MG PO TB24: ORAL_TABLET | ORAL | 1 refills | Status: DC

## 2020-08-23 NOTE — Telephone Encounter (Signed)
Refill sent in per request.  

## 2020-08-23 NOTE — Telephone Encounter (Signed)
*  STAT* If patient is at the pharmacy, call can be transferred to refill team.   1. Which medications need to be refilled? (please list name of each medication and dose if known) metoprolol succinate (TOPROL-XL) 50 MG 24 hr tablet  2. Which pharmacy/location (including street and city if local pharmacy) is medication to be sent to? [id number] - [place], [place] - [address]  3. Do they need a 30 day or 90 day supply? 90 day     

## 2020-09-25 IMAGING — MR MRI ABDOMEN WITH AND WITHOUT CONTRAST
9 of 18 series · 21 of 48 positions shown · IV contrast (gadavist)
Comparison: CT scan 05/17/2019

CLINICAL DATA: Evaluate liver lesions seen on recent CT scan.

EXAM:
MRI ABDOMEN WITHOUT AND WITH CONTRAST
TECHNIQUE: Multiplanar multisequence MR imaging of the abdomen was performed
both before and after the administration of intravenous contrast.
CONTRAST:  9 cc Gadavist

[Series 3: T2 fat-sat · axial · 5.0mm · 0.78mm/px · z∈[-120,+135]mm · 2 of 52 slices shown]
[im 1/52]
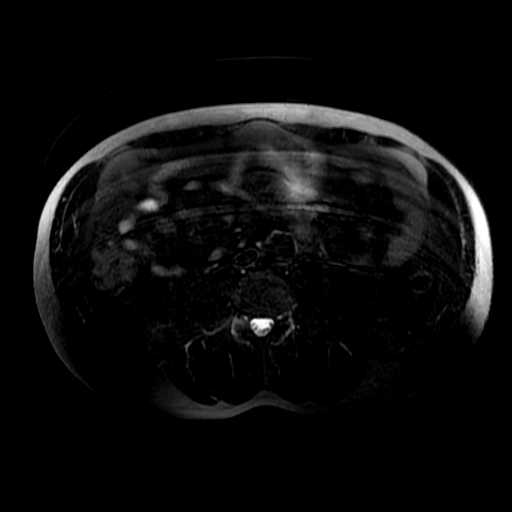
[im 52/52]
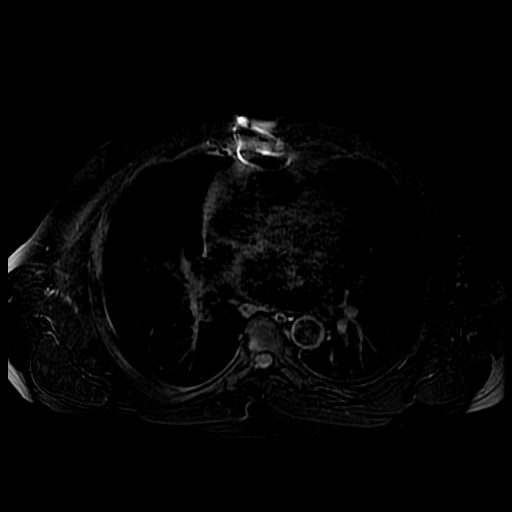

[Series 4: DWI b500 · axial · 6.0mm · 1.48mm/px · z∈[-133,+125]mm · 2 of 68 slices shown]
[im 1/68]
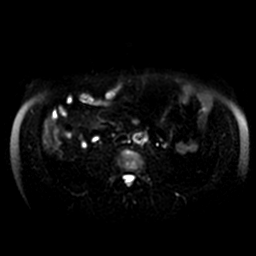
[im 68/68]
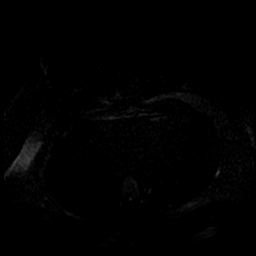

[Series 5: T2 · axial · 5.0mm · 0.78mm/px · z∈[-120,+135]mm · 2 of 52 slices shown (1 of 2)]
[im 1/52]
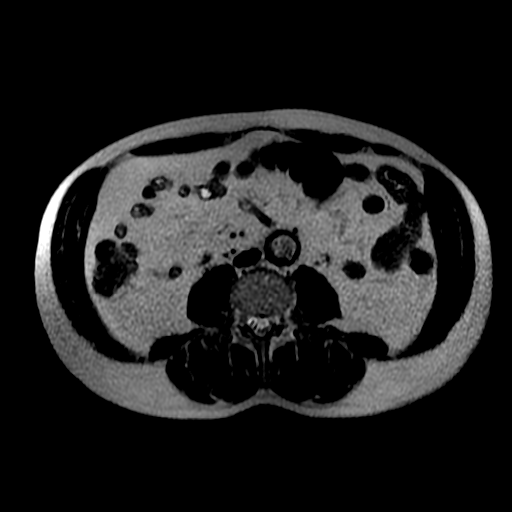
[im 52/52]
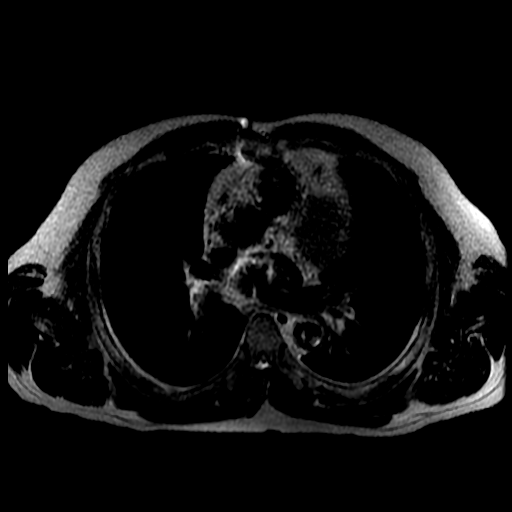

[Series 6: T2 · coronal · 5.0mm · 0.78mm/px · 2 of 53 slices shown (2 of 2)]
[im 1/53]
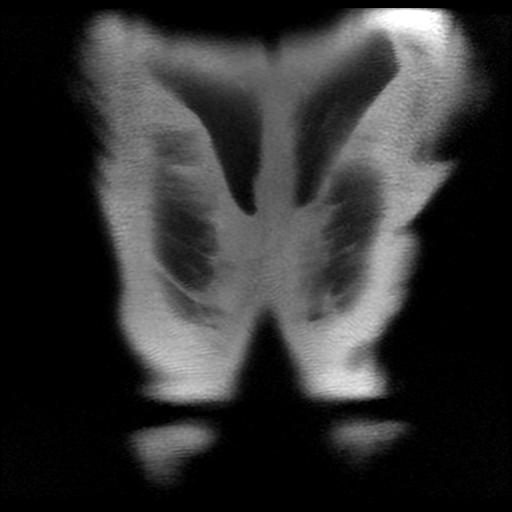
[im 53/53]
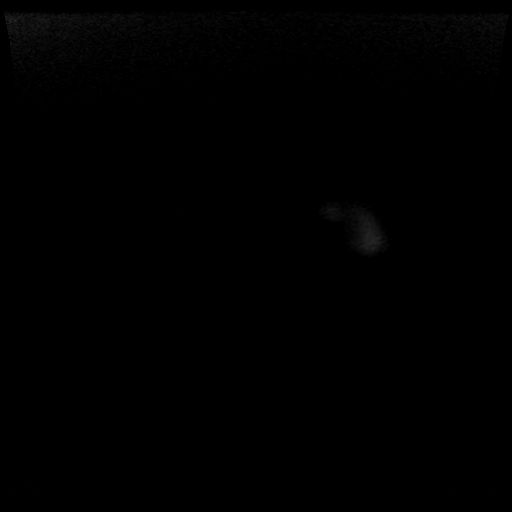

[Series 7: bSSFP · axial · 5.0mm · 0.78mm/px · z∈[-120,+135]mm · 2 of 52 slices shown]
[im 1/52]
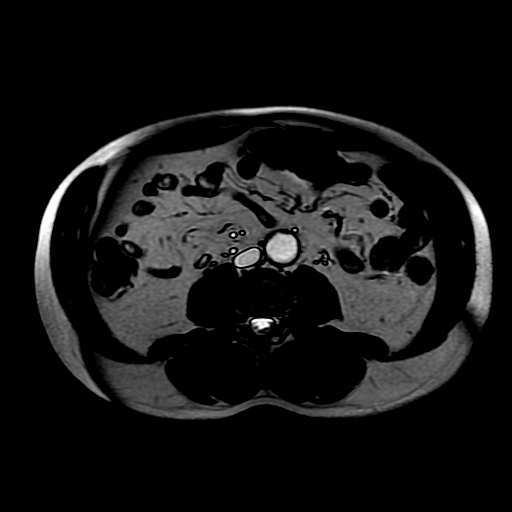
[im 52/52]
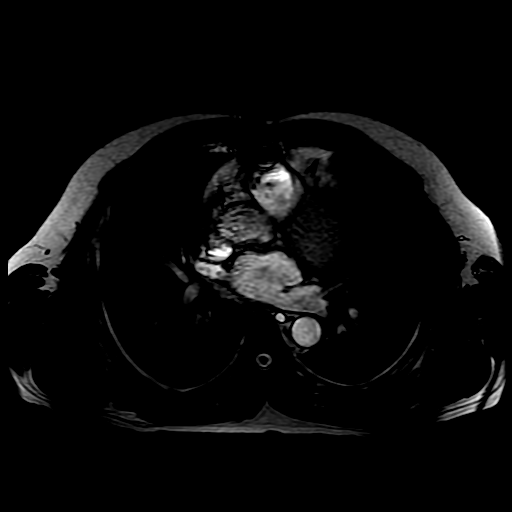

[Series 8: ax dualecho bh · axial · 5.0mm · 0.78mm/px · z∈[-120,+135]mm · 4 of 104 slices shown]
[im 1/104]
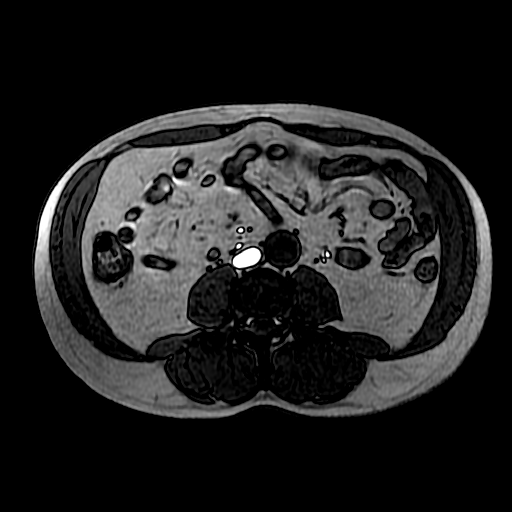
[im 35/104]
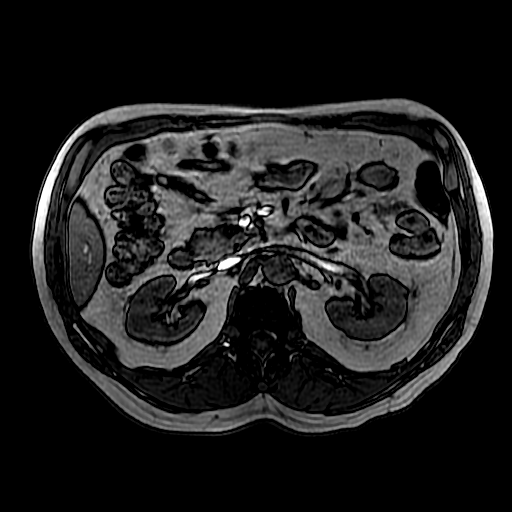
[im 69/104]
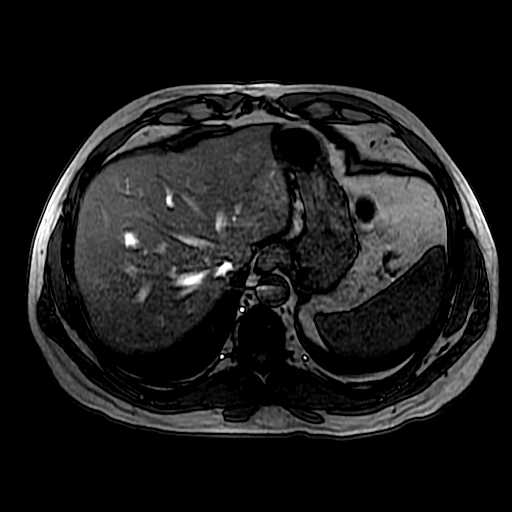
[im 104/104]
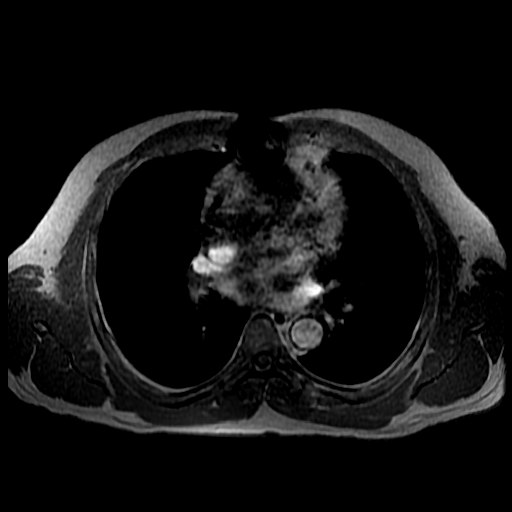

[Series 400: DWI · axial · 6.0mm · 1.48mm/px · 1 of 34 slices shown]
[im 1/34]
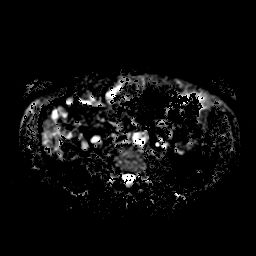

[Series 900: T1 dynamic · axial · 6.0mm · 0.78mm/px · z∈[-154,+107]mm · 3 of 88 slices shown (1 of 2)]
[im 1/88]
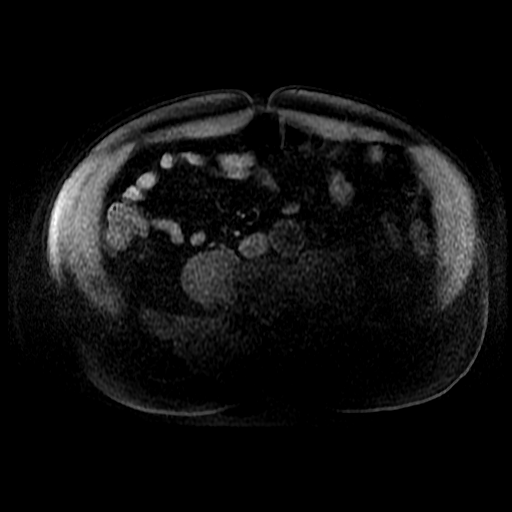
[im 44/88]
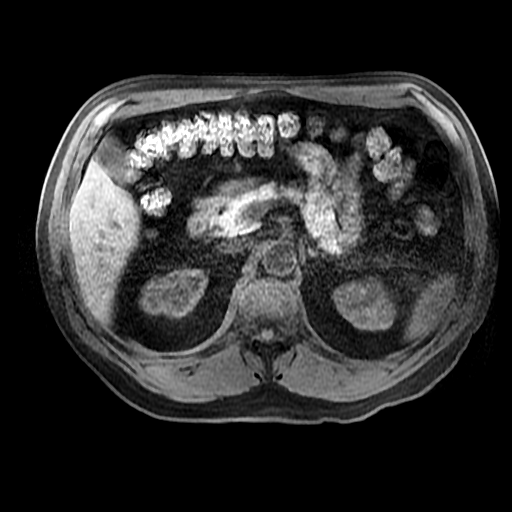
[im 88/88]
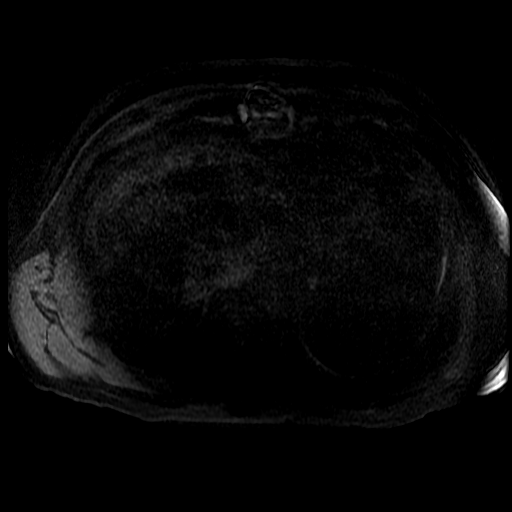

[Series 901: T1 dynamic · axial · 6.0mm · 0.78mm/px · z∈[-154,+107]mm · 3 of 88 slices shown (2 of 2)]
[im 1/88]
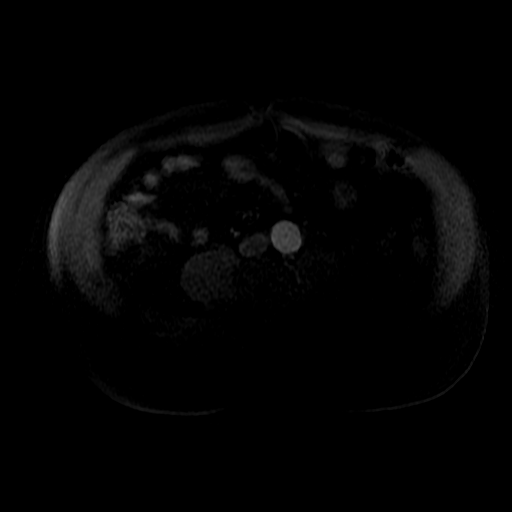
[im 44/88]
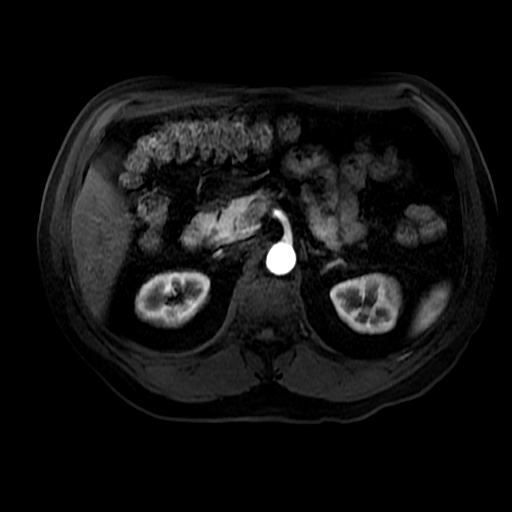
[im 88/88]
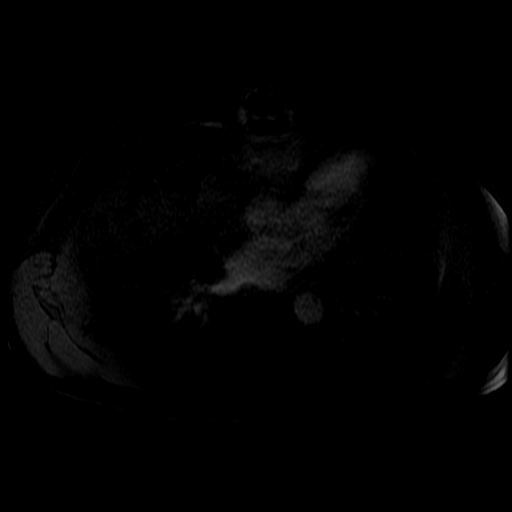

[21 of 48 positions shown; findings below may reference images not displayed]

FINDINGS: Lower chest: The lung bases are grossly clear. No acute pulmonary
findings or worrisome pulmonary lesions. No pleural or pericardial
effusion.

Hepatobiliary: 9 mm bilobed or septated cyst noted in segment 2 of
the liver.

Simple appearing 10 mm nonenhancing cyst noted in segment 8.

18 mm lesion and segment 7 has increased T2 signal intensity and
peripheral nodular type enhancement suggesting a benign hepatic
hemangioma.

5.5 mm cyst noted in segment 6.

No worrisome hepatic lesions or intrahepatic biliary dilatation. The
gallbladder appears normal. No common bile duct dilatation.

Pancreas:  No mass, inflammation or ductal dilatation.

Spleen:  Normal size.  No focal lesions.

Adrenals/Urinary Tract: The adrenal glands and kidneys are
unremarkable. There are simple appearing bilateral renal cysts.
Remote cortical scarring changes involving the left kidney.

Stomach/Bowel: The stomach, duodenum, visualized small bowel and
visualize colon are unremarkable. A duodenal diverticulum is noted.

Vascular/Lymphatic: The aorta is normal in caliber. The branch
vessels are patent. No mesenteric or retroperitoneal mass or
adenopathy.

Other:  No ascites or abdominal wall hernia.

Musculoskeletal: No significant bony findings.
IMPRESSION: 1. Four benign liver lesions. Three are cysts and one is a
hemangioma.
2. No worrisome hepatic lesions and no intrahepatic or extrahepatic
biliary dilatation.
3. Simple appearing renal cysts.
4. No acute abdominal findings, mass lesions or adenopathy.

## 2020-09-28 ENCOUNTER — Telehealth: Payer: Self-pay | Admitting: Cardiology

## 2020-09-28 NOTE — Telephone Encounter (Signed)
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 He needs another copy e-mailed to [email or web address]. He needs this asap, his licenses have been revoked.

## 2020-09-28 NOTE — Telephone Encounter (Signed)
Patient called back in requesting medical records from a medical clearance from 2020 and also would like an updated one.   Please call.

## 2020-09-28 NOTE — Telephone Encounter (Signed)
Attempted to call back. No answer will continue efforts.

## 2020-10-01 NOTE — Telephone Encounter (Signed)
Left another message for the patient to call back.  

## 2020-10-01 NOTE — Telephone Encounter (Signed)
Patient's wife is following up.  

## 2020-10-01 NOTE — Telephone Encounter (Signed)
Attempted to call patient no answer and no voicemail. Will continue efforts  °

## 2020-10-01 NOTE — Telephone Encounter (Signed)
Advised her that Dr. Agustin Cree has her message but he has a full schedule and will call her as soon as he is able to get a chance to review their request.

## 2020-10-01 NOTE — Telephone Encounter (Signed)
Patient is returning call.  °

## 2020-10-01 NOTE — Telephone Encounter (Signed)
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   Last OV 06/22/20 Stress test  12/08/2019 Echo 11/28/2019  If okay his wife can pick up asap... her name is Ivin Booty and her number [phone].   Will forward to Dr. Toni Arthurs.

## 2020-10-02 ENCOUNTER — Encounter: Payer: Self-pay | Admitting: Emergency Medicine

## 2020-10-02 NOTE — Telephone Encounter (Signed)
Tried to call patient back no answer no voicemail will continue efforts.

## 2020-10-02 NOTE — Telephone Encounter (Signed)
Yes we can write a letter that he can write for DOT from cardiac point of view

## 2020-10-02 NOTE — Telephone Encounter (Signed)
Patient called back. Informed him Dr. Agustin Cree is out of the office this pm. Letter has been printed will have Dr.Krasowski sign tomorrow morning. Patient will pick up tomorrow. No further questions.

## 2020-11-21 DIAGNOSIS — L821 Other seborrheic keratosis: Secondary | ICD-10-CM | POA: Diagnosis not present

## 2020-11-21 DIAGNOSIS — L578 Other skin changes due to chronic exposure to nonionizing radiation: Secondary | ICD-10-CM | POA: Diagnosis not present

## 2020-11-21 DIAGNOSIS — L814 Other melanin hyperpigmentation: Secondary | ICD-10-CM | POA: Diagnosis not present

## 2020-11-21 DIAGNOSIS — D225 Melanocytic nevi of trunk: Secondary | ICD-10-CM | POA: Diagnosis not present

## 2020-11-21 DIAGNOSIS — Z23 Encounter for immunization: Secondary | ICD-10-CM | POA: Diagnosis not present

## 2020-12-19 ENCOUNTER — Other Ambulatory Visit: Payer: Self-pay

## 2020-12-19 DIAGNOSIS — Z87442 Personal history of urinary calculi: Secondary | ICD-10-CM | POA: Insufficient documentation

## 2020-12-19 DIAGNOSIS — N200 Calculus of kidney: Secondary | ICD-10-CM | POA: Insufficient documentation

## 2020-12-19 DIAGNOSIS — E7800 Pure hypercholesterolemia, unspecified: Secondary | ICD-10-CM

## 2020-12-19 DIAGNOSIS — K219 Gastro-esophageal reflux disease without esophagitis: Secondary | ICD-10-CM | POA: Insufficient documentation

## 2020-12-19 DIAGNOSIS — I2510 Atherosclerotic heart disease of native coronary artery without angina pectoris: Secondary | ICD-10-CM | POA: Insufficient documentation

## 2020-12-19 HISTORY — DX: Pure hypercholesterolemia, unspecified: E78.00

## 2020-12-20 ENCOUNTER — Ambulatory Visit (INDEPENDENT_AMBULATORY_CARE_PROVIDER_SITE_OTHER): Payer: BC Managed Care – PPO | Admitting: Cardiology

## 2020-12-20 ENCOUNTER — Other Ambulatory Visit: Payer: Self-pay

## 2020-12-20 ENCOUNTER — Encounter: Payer: Self-pay | Admitting: Cardiology

## 2020-12-20 VITALS — BP 108/70 | HR 85 | Ht 68.0 in | Wt 204.0 lb

## 2020-12-20 DIAGNOSIS — I2510 Atherosclerotic heart disease of native coronary artery without angina pectoris: Secondary | ICD-10-CM

## 2020-12-20 DIAGNOSIS — Z951 Presence of aortocoronary bypass graft: Secondary | ICD-10-CM | POA: Diagnosis not present

## 2020-12-20 DIAGNOSIS — E7800 Pure hypercholesterolemia, unspecified: Secondary | ICD-10-CM | POA: Diagnosis not present

## 2020-12-20 NOTE — Addendum Note (Signed)
Addended by: Hazle Quant on: 12/20/2020 08:42 AM   Modules accepted: Orders

## 2020-12-20 NOTE — Patient Instructions (Signed)
Medication Instructions:  Your physician recommends that you continue on your current medications as directed. Please refer to the Current Medication list given to you today.  *If you need a refill on your cardiac medications before your next appointment, please call your pharmacy*   Lab Work: .None  If you have labs (blood work) drawn today and your tests are completely normal, you will receive your results only by: . MyChart Message (if you have MyChart) OR . A paper copy in the mail If you have any lab test that is abnormal or we need to change your treatment, we will call you to review the results.   Testing/Procedures:   [facility] [address], [address] [place], [place] [address] Phone: [phone]    Please arrive 15 minutes prior to your appointment time for registration and insurance purposes.  The test will take approximately 3 to 4 hours to complete; you may bring reading material.  If someone comes with you to your appointment, they will need to remain in the main lobby due to limited space in the testing area. **If you are pregnant or breastfeeding, please notify the nuclear lab prior to your appointment**  How to prepare for your Myocardial Perfusion Test: . Do not eat or drink 3 hours prior to your test, except you may have water. . Do not consume products containing caffeine (regular or decaffeinated) 12 hours prior to your test. (ex: coffee, chocolate, sodas, tea). . Do bring a list of your current medications with you.  If not listed below, you may take your medications as normal.  . Do wear comfortable clothes (no dresses or overalls) and walking shoes, tennis shoes preferred (No heels or open toe shoes are allowed). . Do NOT wear cologne, perfume, aftershave, or lotions (deodorant is allowed). . If these instructions are not followed, your test will have to be rescheduled.  Please report to [address], [address] for your test.  If you have questions or concerns about your appointment, you can call the Nuclear Lab at [phone].  If you cannot keep your appointment, please provide 24 hours notification to the Nuclear Lab, to avoid a possible $50 charge to your account.    Follow-Up: At [facility], you and your health needs are our priority.  As part of our continuing mission to provide you with exceptional heart care, we have created designated Provider Care Teams.  These Care Teams include your primary Cardiologist (physician) and Advanced Practice Providers (APPs -  Physician Assistants and Nurse Practitioners) who all work together to provide you with the care you need, when you need it.  We recommend signing up for the patient portal called "MyChart".  Sign up information is provided on this After Visit Summary.  MyChart is used to connect with patients for Virtual Visits (Telemedicine).  Patients are able to view lab/test results, encounter notes, upcoming appointments, etc.  Non-urgent messages can be sent to your provider as well.   To learn more about what you can do with MyChart, go to [email or web address].    Your next appointment:   6 month(s)  The format for your next appointment:   In Person  Provider:   Robert Krasowski, MD   Other Instructions   Cardiac Nuclear Scan A cardiac nuclear scan is a test that measures blood flow to the heart when a person is resting and when he or she is exercising. The test looks for problems such   Not enough blood reaching a portion of the heart.  The heart muscle not working normally. You may need this test if:  You have heart disease.  You have had abnormal lab results.  You have had heart surgery or a balloon procedure to open up blocked arteries (angioplasty).  You have chest pain.  You have shortness of breath. In this test, a radioactive dye (tracer) is injected into your bloodstream. After the tracer has  traveled to your heart, an imaging device is used to measure how much of the tracer is absorbed by or distributed to various areas of your heart. This procedure is usually done at a hospital and takes 2-4 hours. Tell a health care provider about:  Any allergies you have.  All medicines you are taking, including vitamins, herbs, eye drops, creams, and over-the-counter medicines.  Any problems you or family members have had with anesthetic medicines.  Any blood disorders you have.  Any surgeries you have had.  Any medical conditions you have.  Whether you are pregnant or may be pregnant. What are the risks? Generally, this is a safe procedure. However, problems may occur, including:  Serious chest pain and heart attack. This is only a risk if the stress portion of the test is done.  Rapid heartbeat.  Sensation of warmth in your chest. This usually passes quickly.  Allergic reaction to the tracer. What happens before the procedure?  Ask your health care provider about changing or stopping your regular medicines. This is especially important if you are taking diabetes medicines or blood thinners.  Follow instructions from your health care provider about eating or drinking restrictions.  Remove your jewelry on the day of the procedure. What happens during the procedure?  An IV will be inserted into one of your veins.  Your health care provider will inject a small amount of radioactive tracer through the IV.  You will wait for 20-40 minutes while the tracer travels through your bloodstream.  Your heart activity will be monitored with an electrocardiogram (ECG).  You will lie down on an exam table.  Images of your heart will be taken for about 15-20 minutes.  You may also have a stress test. For this test, one of the following may be done: ? You will exercise on a treadmill or stationary bike. While you exercise, your heart's activity will be monitored with an ECG, and your  blood pressure will be checked. ? You will be given medicines that will increase blood flow to parts of your heart. This is done if you are unable to exercise.  When blood flow to your heart has peaked, a tracer will again be injected through the IV.  After 20-40 minutes, you will get back on the exam table and have more images taken of your heart.  Depending on the type of tracer used, scans may need to be repeated 3-4 hours later.  Your IV line will be removed when the procedure is over. The procedure may vary among health care providers and hospitals. What happens after the procedure?  Unless your health care provider tells you otherwise, you may return to your normal schedule, including diet, activities, and medicines.  Unless your health care provider tells you otherwise, you may increase your fluid intake. This will help to flush the contrast dye from your body. Drink enough fluid to keep your urine pale yellow.  Ask your health care provider, or the department that is doing the test: ? When will my results  be ready? ? How will I get my results? Summary  A cardiac nuclear scan measures the blood flow to the heart when a person is resting and when he or she is exercising.  Tell your health care provider if you are pregnant.  Before the procedure, ask your health care provider about changing or stopping your regular medicines. This is especially important if you are taking diabetes medicines or blood thinners.  After the procedure, unless your health care provider tells you otherwise, increase your fluid intake. This will help flush the contrast dye from your body.  After the procedure, unless your health care provider tells you otherwise, you may return to your normal schedule, including diet, activities, and medicines. This information is not intended to replace advice given to you by your health care provider. Make sure you discuss any questions you have with your health care  provider. Document Revised: 05/17/2018 Document Reviewed: 05/17/2018 Elsevier Patient Education  2020 Elsevier Inc.   

## 2020-12-20 NOTE — Progress Notes (Signed)
Cardiology Office Note:    Date:  12/20/2020   ID:  Todd Buck, DOB 1956/08/11, MRN [id number]  PCP:  Shirline Frees, MD  Cardiologist:  Jenne Campus, MD    Referring MD: Shirline Frees, MD   No chief complaint on file. I am doing well  History of Present Illness:    Todd Buck is a 65 y.o. male with past medical history significant for coronary artery disease, in 2019 he did have coronary bypass graft x4 with saphenous vein to ramus intermedius, diagonal, heart distal RCA, LIMA to LAD.  Also history of hyperlipidemia, essential hypertension.  Comes today to my for follow-up.  Overall doing well.  Denies have any chest pain tightness squeezing pressure burning chest.  He exercised on the regular basis.  In the matter-of-fact this morning before he came to my office he was lifting some weights.  He can walk climb stairs one and have no difficulty overall he is doing well.  Past Medical History:  Diagnosis Date  . CAD (coronary artery disease), native coronary artery 02/16/2018   Cath 02/16/18    99% prox RCA, PDA 100% with collaterals, Ramus 60 and 80% prox, Prox LAD 95%, mid 70%, mid 100% with collaterals, ostial diagonal 90%, ostial circ 99%  CABG 02/18/18 Dr. Servando Snare with LIMA to LAD, SVG to Ramus, SVG to diag, SVG to RCA  . Chronic kidney disease with active medical management without dialysis, stage 3 (moderate) ([place]) 02/15/2018  . Coronary arteriosclerosis   . Coronary artery disease   . Gastroesophageal reflux disease   . GERD (gastroesophageal reflux disease)   . History of kidney stones   . Hyperlipidemia 02/15/2018  . Kidney disease, chronic, stage III (GFR 30-59 ml/min) ([place]) 02/15/2018  . Kidney stones   . Obesity (BMI 30-39.9) 02/15/2018  . Pure hypercholesterolemia 12/19/2020  . S/P CABG x 4 02/17/2018  . Unstable angina ([place]) 02/15/2018    Past Surgical History:  Procedure Laterality Date  . COLONOSCOPY  2007  . CORONARY ARTERY BYPASS GRAFT N/A 02/17/2018   Procedure:  CORONARY ARTERY BYPASS GRAFTING (CABG) x 4 WITH ENDOSCOPIC HARVESTING OF RIGHT SAPHENOUS VEIN. LIMA TO LAD, SVG TO DISTAL RCA, SVG TO DIAGONAL, SVG TO INTERMEDIUS;  Surgeon: Grace Isaac, MD;  Location: [facility];  Service: Open Heart Surgery;  Laterality: N/A;  . dental implants    . EXTRACORPOREAL SHOCK WAVE LITHOTRIPSY Right 05/30/2019   Procedure: EXTRACORPOREAL SHOCK WAVE LITHOTRIPSY (ESWL);  Surgeon: Ardis Hughs, MD;  Location[facility];  Service: Urology;  Laterality: Right;  . LASIK     EYES  . LEFT HEART CATH AND CORONARY ANGIOGRAPHY N/A 02/16/2018   Procedure: LEFT HEART CATH AND CORONARY ANGIOGRAPHY;  Surgeon: Leonie Man, MD;  Location: [facility];  Service: Cardiovascular;  Laterality: N/A;  . LITHOTRIPSY    . POLYPECTOMY    . TEE WITHOUT CARDIOVERSION N/A 02/17/2018   Procedure: TRANSESOPHAGEAL ECHOCARDIOGRAM (TEE);  Surgeon: Grace Isaac, MD;  Location: [facility];  Service: Open Heart Surgery;  Laterality: N/A;    Current Medications: Current Meds  Medication Sig  . aspirin EC 81 MG tablet Take 81 mg by mouth daily. Swallow whole.  Marland Kitchen atorvastatin (LIPITOR) 80 MG tablet TAKE 1 TABLET(80 MG) BY MOUTH DAILY AT 6 PM  . ezetimibe (ZETIA) 10 MG tablet TAKE 1 TABLET(10 MG) BY MOUTH DAILY  . metoprolol succinate (TOPROL-XL) 50 MG 24 hr tablet TAKE 1 TABLET BY MOUTH DAILY. TAKE WITH OR IMMEDIATELY  FOLLOWING A MEAL  . [DISCONTINUED] atorvastatin (LIPITOR) 40 MG tablet Take 40 mg by mouth daily.     Allergies:   Patient has no known allergies.   Social History   Socioeconomic History  . Marital status: Married    Spouse name: Not on file  . Number of children: Not on file  . Years of education: Not on file  . Highest education level: Not on file  Occupational History  . Not on file  Tobacco Use  . Smoking status: Former Research scientist (life sciences)  . Smokeless tobacco: Never Used  . Tobacco comment: 40 years ago  Vaping Use  . Vaping Use: Never used  Substance and Sexual  Activity  . Alcohol use: No  . Drug use: No  . Sexual activity: Yes  Other Topics Concern  . Not on file  Social History Narrative  . Not on file   Social Determinants of Health   Financial Resource Strain: Not on file  Food Insecurity: Not on file  Transportation Needs: Not on file  Physical Activity: Not on file  Stress: Not on file  Social Connections: Not on file     Family History: The patient's family history includes Heart disease in his father and mother; Hyperlipidemia in his sister and sister; Renal Disease in his sister. There is no history of Colon cancer. ROS:   Please see the history of present illness.    All 14 point review of systems negative except as described per history of present illness  EKGs/Labs/Other Studies Reviewed:      Recent Labs: 06/22/2020: ALT 34  Recent Lipid Panel    Component Value Date/Time   CHOL 108 06/22/2020 0833   TRIG 91 06/22/2020 0833   HDL 40 06/22/2020 0833   CHOLHDL 2.7 06/22/2020 0833   CHOLHDL 5.2 02/15/2018 1646   VLDL 50 (H) 02/15/2018 1646   LDLCALC 50 06/22/2020 0833    Physical Exam:    VS:  BP 108/70 (BP Location: Left Arm, Patient Position: Sitting)   Pulse 85   Ht 5\' 8"  (1.727 m)   Wt 204 lb (92.5 kg)   SpO2 94%   BMI 31.02 kg/m     Wt Readings from Last 3 Encounters:  12/20/20 204 lb (92.5 kg)  06/22/20 206 lb (93.4 kg)  12/12/19 205 lb (93 kg)     GEN:  Well nourished, well developed in no acute distress HEENT: Normal NECK: No JVD; No carotid bruits LYMPHATICS: No lymphadenopathy CARDIAC: RRR, no murmurs, no rubs, no gallops RESPIRATORY:  Clear to auscultation without rales, wheezing or rhonchi  ABDOMEN: Soft, non-tender, non-distended MUSCULOSKELETAL:  No edema; No deformity  SKIN: Warm and dry LOWER EXTREMITIES: no swelling NEUROLOGIC:  Alert and oriented x 3 PSYCHIATRIC:  Normal affect   ASSESSMENT:    1. S/P CABG x 4   2. Pure hypercholesterolemia   3. Coronary artery disease  involving native coronary artery of native heart without angina pectoris    PLAN:    In order of problems listed above:  1. Coronary disease status post coronary bypass graft.  Doing very well.  Vascular stress test showed no evidence of ischemia but diminished ejection fraction, after that, repeat echocardiogram showed preserved left ventricle ejection fraction.  He is on long-acting beta-blocker aspirin as well as statin therapy which I will continue.  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  We will schedule him for exercise Cardiolite in May. 2. Essential hypertension: I did review his K PN which  show me his LDL is 50 HDL 40 this is from July of this year.  We will continue present aggressive lipid-lowering therapy. 3. Essential hypertension: Blood pressure well controlled. 4. We did talk about healthy lifestyle need to exercise on the regular basis which he already does.  He does not stay with any diet I did discuss basic of Mediterranean diet and recommended him to be on it.   Medication Adjustments/Labs and Tests Ordered: Current medicines are reviewed at length with the patient today.  Concerns regarding medicines are outlined above.  No orders of the defined types were placed in this encounter.  Medication changes: No orders of the defined types were placed in this encounter.   Signed, Georgeanna Lea, MD, [facility] 12/20/2020 8:32 AM    [facility]

## 2020-12-21 ENCOUNTER — Other Ambulatory Visit (HOSPITAL_BASED_OUTPATIENT_CLINIC_OR_DEPARTMENT_OTHER): Payer: Self-pay | Admitting: Internal Medicine

## 2020-12-21 ENCOUNTER — Ambulatory Visit: Payer: BC Managed Care – PPO | Attending: Internal Medicine

## 2020-12-21 DIAGNOSIS — Z23 Encounter for immunization: Secondary | ICD-10-CM

## 2020-12-21 NOTE — Progress Notes (Signed)
   Covid-19 Vaccination Clinic  Name:  Todd Buck    MRN: [id number] DOB: [birth year]  12/21/2020  Mr. Favor was observed post Covid-19 immunization for 15 minutes without incident. He was provided with Vaccine Information Sheet and instruction to access the V-Safe system.   [name] was instructed to call 911 with any severe reactions post vaccine: Marland Kitchen Difficulty breathing  . Swelling of face and throat  . A fast heartbeat  . A bad rash all over body  . Dizziness and weakness   Immunizations Administered    Name Date Dose VIS Date Route   Moderna Covid-19 Booster Vaccine 12/21/2020 10:43 AM 0.25 mL 10/03/2020 Intramuscular   Manufacturer: Levan Hurst   Lot: [id number]   [place]: [phone]

## 2020-12-24 MED FILL — MODERNA COVID-19 VACCINE 10: 100 | 28 days supply | Qty: 0 | Fill #0

## 2021-01-09 ENCOUNTER — Other Ambulatory Visit: Payer: Self-pay | Admitting: Cardiology

## 2021-02-20 ENCOUNTER — Encounter (HOSPITAL_COMMUNITY): Payer: Self-pay | Admitting: Cardiology

## 2021-03-13 ENCOUNTER — Telehealth: Payer: Self-pay | Admitting: Cardiology

## 2021-03-13 MED ORDER — METOPROLOL SUCCINATE ER 50 MG PO TB24: ORAL_TABLET | ORAL | 1 refills | Status: DC

## 2021-03-13 NOTE — Telephone Encounter (Signed)
*  STAT* If patient is at the pharmacy, call can be transferred to refill team.   1. Which medications need to be refilled? (please list name of each medication and dose if known) metoprolol succinate (TOPROL-XL) 50 MG 24 hr tablet  2. Which pharmacy/location (including street and city if local pharmacy) is medication to be sent to? [id number] - [place], [place] - [address]  3. Do they need a 30 day or 90 day supply? 90 day

## 2021-03-13 NOTE — Telephone Encounter (Signed)
Medication filled.  

## 2021-03-19 DIAGNOSIS — I2510 Atherosclerotic heart disease of native coronary artery without angina pectoris: Secondary | ICD-10-CM | POA: Insufficient documentation

## 2021-03-19 DIAGNOSIS — K219 Gastro-esophageal reflux disease without esophagitis: Secondary | ICD-10-CM | POA: Insufficient documentation

## 2021-03-21 ENCOUNTER — Encounter: Payer: Self-pay | Admitting: Cardiology

## 2021-03-21 ENCOUNTER — Other Ambulatory Visit: Payer: Self-pay

## 2021-03-21 ENCOUNTER — Ambulatory Visit (INDEPENDENT_AMBULATORY_CARE_PROVIDER_SITE_OTHER): Payer: BC Managed Care – PPO | Admitting: Cardiology

## 2021-03-21 VITALS — BP 140/86 | HR 62 | Ht 68.0 in | Wt 203.0 lb

## 2021-03-21 DIAGNOSIS — Z951 Presence of aortocoronary bypass graft: Secondary | ICD-10-CM | POA: Diagnosis not present

## 2021-03-21 DIAGNOSIS — E782 Mixed hyperlipidemia: Secondary | ICD-10-CM | POA: Diagnosis not present

## 2021-03-21 DIAGNOSIS — I2510 Atherosclerotic heart disease of native coronary artery without angina pectoris: Secondary | ICD-10-CM | POA: Diagnosis not present

## 2021-03-21 NOTE — Patient Instructions (Signed)

## 2021-03-21 NOTE — Progress Notes (Signed)
Cardiology Office Note:    Date:  03/21/2021   ID:  Todd Buck, DOB 02/21/1956, MRN [id number]  PCP:  Shirline Frees, MD  Cardiologist:  Jenne Campus, MD    Referring MD: Shirline Frees, MD   Chief Complaint  Patient presents with  . Follow-up  [name] fine  History of Present Illness:    Todd Buck is a 65 y.o. male with past medical history significant for coronary artery disease in March 2019, essential hypertension, dyslipidemia.  Comes today 2 months of follow-up.  Overall doing very well no chest pain tightness squeezing pressure burning chest.  He is still walking on a very busy and have no difficulty doing it.  Past Medical History:  Diagnosis Date  . CAD (coronary artery disease), native coronary artery 02/16/2018   Cath 02/16/18    99% prox RCA, PDA 100% with collaterals, Ramus 60 and 80% prox, Prox LAD 95%, mid 70%, mid 100% with collaterals, ostial diagonal 90%, ostial circ 99%  CABG 02/18/18 Dr. Servando Snare with LIMA to LAD, SVG to Ramus, SVG to diag, SVG to RCA  . Chronic kidney disease with active medical management without dialysis, stage 3 (moderate) ([place]) 02/15/2018  . Coronary arteriosclerosis   . Coronary artery disease   . Gastroesophageal reflux disease   . GERD (gastroesophageal reflux disease)   . History of kidney stones   . Hyperlipidemia 02/15/2018  . Kidney disease, chronic, stage III (GFR 30-59 ml/min) ([place]) 02/15/2018  . Kidney stones   . Obesity (BMI 30-39.9) 02/15/2018  . Pure hypercholesterolemia 12/19/2020  . S/P CABG x 4 02/17/2018  . Unstable angina ([place]) 02/15/2018    Past Surgical History:  Procedure Laterality Date  . COLONOSCOPY  2007  . CORONARY ARTERY BYPASS GRAFT N/A 02/17/2018   Procedure: CORONARY ARTERY BYPASS GRAFTING (CABG) x 4 WITH ENDOSCOPIC HARVESTING OF RIGHT SAPHENOUS VEIN. LIMA TO LAD, SVG TO DISTAL RCA, SVG TO DIAGONAL, SVG TO INTERMEDIUS;  Surgeon: Grace Isaac, MD;  Location: [facility];  Service: Open Heart Surgery;  Laterality:  N/A;  . dental implants    . EXTRACORPOREAL SHOCK WAVE LITHOTRIPSY Right 05/30/2019   Procedure: EXTRACORPOREAL SHOCK WAVE LITHOTRIPSY (ESWL);  Surgeon: Ardis Hughs, MD;  [facility];  Service: Urology;  Laterality: Right;  . LASIK     EYES  . LEFT HEART CATH AND CORONARY ANGIOGRAPHY N/A 02/16/2018   Procedure: LEFT HEART CATH AND CORONARY ANGIOGRAPHY;  Surgeon: Leonie Man, MD;  Location: [facility];  Service: Cardiovascular;  Laterality: N/A;  . LITHOTRIPSY    . POLYPECTOMY    . TEE WITHOUT CARDIOVERSION N/A 02/17/2018   Procedure: TRANSESOPHAGEAL ECHOCARDIOGRAM (TEE);  Surgeon: Grace Isaac, MD;  Location: [facility];  Service: Open Heart Surgery;  Laterality: N/A;    Current Medications: No outpatient medications have been marked as taking for the 03/21/21 encounter (Office Visit) with Park Liter, MD.     Allergies:   Patient has no known allergies.   Social History   Socioeconomic History  . Marital status: Married    Spouse name: Not on file  . Number of children: Not on file  . Years of education: Not on file  . Highest education level: Not on file  Occupational History  . Not on file  Tobacco Use  . Smoking status: Former Research scientist (life sciences)  . Smokeless tobacco: Never Used  . Tobacco comment: 40 years ago  Vaping Use  . Vaping Use: Never used  Substance and  Sexual Activity  . Alcohol use: No  . Drug use: No  . Sexual activity: Yes  Other Topics Concern  . Not on file  Social History Narrative  . Not on file   Social Determinants of Health   Financial Resource Strain: Not on file  Food Insecurity: Not on file  Transportation Needs: Not on file  Physical Activity: Not on file  Stress: Not on file  Social Connections: Not on file     Family History: The patient's family history includes Heart disease in his father and mother; Hyperlipidemia in his sister and sister; Renal Disease in his sister. There is no history of Colon cancer. ROS:    Please see the history of present illness.    All 14 point review of systems negative except as described per history of present illness  EKGs/Labs/Other Studies Reviewed:      Recent Labs: 06/22/2020: ALT 34  Recent Lipid Panel    Component Value Date/Time   CHOL 108 06/22/2020 0833   TRIG 91 06/22/2020 0833   HDL 40 06/22/2020 0833   CHOLHDL 2.7 06/22/2020 0833   CHOLHDL 5.2 02/15/2018 1646   VLDL 50 (H) 02/15/2018 1646   LDLCALC 50 06/22/2020 0833    Physical Exam:    VS:  BP 140/86 (BP Location: Right Arm, Patient Position: Sitting)   Pulse 62   Ht 5\' 8"  (1.727 m)   Wt 203 lb (92.1 kg)   SpO2 96%   BMI 30.87 kg/m     Wt Readings from Last 3 Encounters:  03/21/21 203 lb (92.1 kg)  12/20/20 204 lb (92.5 kg)  06/22/20 206 lb (93.4 kg)     GEN:  Well nourished, well developed in no acute distress HEENT: Normal NECK: No JVD; No carotid bruits LYMPHATICS: No lymphadenopathy CARDIAC: RRR, no murmurs, no rubs, no gallops RESPIRATORY:  Clear to auscultation without rales, wheezing or rhonchi  ABDOMEN: Soft, non-tender, non-distended MUSCULOSKELETAL:  No edema; No deformity  SKIN: Warm and dry LOWER EXTREMITIES: no swelling NEUROLOGIC:  Alert and oriented x 3 PSYCHIATRIC:  Normal affect   ASSESSMENT:    1. S/P CABG x 4   2. Mixed hyperlipidemia   3. Coronary artery disease involving native coronary artery of native heart without angina pectoris    PLAN:    In order of problems listed above:  1. 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  He is scheduled in May will wait for results of his test. 2. Dyslipidemia, he is on high intense statin form of Lipitor 80 which I will continue, I did review his K PN which showed HDL of 40 LDL of 50 excellent cholesterol profile this is from last year we will make arrangements for another test to be checked. 3. We did talk about healthy lifestyle need to exercise and  good diet he understand this and he follows the diet.  Overall he is doing well   Medication Adjustments/Labs and Tests Ordered: Current medicines are reviewed at length with the patient today.  Concerns regarding medicines are outlined above.  No orders of the defined types were placed in this encounter.  Medication changes: No orders of the defined types were placed in this encounter.   Signed, Park Liter, MD, [facility] 03/21/2021 8:52 AM    [place]

## 2021-04-02 NOTE — Addendum Note (Signed)
Addended by: Jenne Campus on: 04/02/2021 11:33 AM   Modules accepted: Orders

## 2021-04-24 ENCOUNTER — Telehealth (HOSPITAL_COMMUNITY): Payer: Self-pay | Admitting: *Deleted

## 2021-04-24 ENCOUNTER — Encounter (HOSPITAL_COMMUNITY): Payer: Self-pay | Admitting: *Deleted

## 2021-04-24 NOTE — Telephone Encounter (Signed)
Patient given detailed instructions per Myocardial Perfusion Study Information Sheet for the test on 05/01/21 at 0815. Patient notified to arrive 15 minutes early and that it is imperative to arrive on time for appointment to keep from having the test rescheduled.  If you need to cancel or reschedule your appointment, please call the office within 24 hours of your appointment. . Patient verbalized understanding.[facility] Mychart letter sent with instructions.

## 2021-04-29 ENCOUNTER — Other Ambulatory Visit (HOSPITAL_COMMUNITY): Payer: BC Managed Care – PPO

## 2021-05-01 ENCOUNTER — Ambulatory Visit (HOSPITAL_COMMUNITY): Payer: BC Managed Care – PPO | Attending: Cardiovascular Disease

## 2021-05-01 ENCOUNTER — Other Ambulatory Visit: Payer: Self-pay

## 2021-05-01 DIAGNOSIS — I2510 Atherosclerotic heart disease of native coronary artery without angina pectoris: Secondary | ICD-10-CM

## 2021-05-01 DIAGNOSIS — E7800 Pure hypercholesterolemia, unspecified: Secondary | ICD-10-CM

## 2021-05-01 LAB — MYOCARDIAL PERFUSION IMAGING
LV dias vol: 121 mL (ref 62–150)
LV sys vol: 61 mL
Peak HR: 108 {beats}/min
Rest HR: 65 {beats}/min
SDS: 3
SRS: 1
SSS: 4
TID: 0.92

## 2021-05-01 MED ORDER — TECHNETIUM TC 99M TETROFOSMIN IV KIT
10.1000 | PACK | Freq: Once | INTRAVENOUS | Status: AC | PRN
  Administered 2021-05-01: 10.1 via INTRAVENOUS
  Filled 2021-05-01: qty 11

## 2021-05-01 MED ORDER — TECHNETIUM TC 99M TETROFOSMIN IV KIT
31.9000 | PACK | Freq: Once | INTRAVENOUS | Status: AC | PRN
Start: 2021-05-01 — End: 2021-05-01
  Administered 2021-05-01: 31.9 via INTRAVENOUS
  Filled 2021-05-01: qty 32

## 2021-05-01 MED ORDER — REGADENOSON 0.4 MG/5ML IV SOLN
0.4000 mg | Freq: Once | INTRAVENOUS | Status: AC
  Administered 2021-05-01: 0.4 mg via INTRAVENOUS

## 2021-05-02 ENCOUNTER — Other Ambulatory Visit: Payer: Self-pay | Admitting: Cardiology

## 2021-05-03 NOTE — Telephone Encounter (Signed)
Refill sent to pharmacy.   

## 2021-05-27 ENCOUNTER — Emergency Department (HOSPITAL_BASED_OUTPATIENT_CLINIC_OR_DEPARTMENT_OTHER): Payer: BC Managed Care – PPO

## 2021-05-27 ENCOUNTER — Other Ambulatory Visit: Payer: Self-pay

## 2021-05-27 ENCOUNTER — Emergency Department (HOSPITAL_BASED_OUTPATIENT_CLINIC_OR_DEPARTMENT_OTHER)
Admission: EM | Admit: 2021-05-27 | Discharge: 2021-05-27 | Disposition: A | Discharge status: Home / Self Care | Payer: BC Managed Care – PPO | Attending: Emergency Medicine | Admitting: Emergency Medicine

## 2021-05-27 ENCOUNTER — Encounter (HOSPITAL_BASED_OUTPATIENT_CLINIC_OR_DEPARTMENT_OTHER): Payer: Self-pay | Admitting: Urology

## 2021-05-27 ENCOUNTER — Other Ambulatory Visit (HOSPITAL_BASED_OUTPATIENT_CLINIC_OR_DEPARTMENT_OTHER): Payer: Self-pay

## 2021-05-27 DIAGNOSIS — Z7982 Long term (current) use of aspirin: Secondary | ICD-10-CM | POA: Diagnosis not present

## 2021-05-27 DIAGNOSIS — K219 Gastro-esophageal reflux disease without esophagitis: Secondary | ICD-10-CM | POA: Diagnosis not present

## 2021-05-27 DIAGNOSIS — Z951 Presence of aortocoronary bypass graft: Secondary | ICD-10-CM | POA: Insufficient documentation

## 2021-05-27 DIAGNOSIS — N183 Chronic kidney disease, stage 3 unspecified: Secondary | ICD-10-CM | POA: Insufficient documentation

## 2021-05-27 DIAGNOSIS — R109 Unspecified abdominal pain: Secondary | ICD-10-CM | POA: Diagnosis not present

## 2021-05-27 DIAGNOSIS — Z87891 Personal history of nicotine dependence: Secondary | ICD-10-CM | POA: Insufficient documentation

## 2021-05-27 DIAGNOSIS — I2510 Atherosclerotic heart disease of native coronary artery without angina pectoris: Secondary | ICD-10-CM | POA: Insufficient documentation

## 2021-05-27 DIAGNOSIS — N132 Hydronephrosis with renal and ureteral calculous obstruction: Secondary | ICD-10-CM | POA: Diagnosis not present

## 2021-05-27 DIAGNOSIS — N201 Calculus of ureter: Secondary | ICD-10-CM | POA: Diagnosis not present

## 2021-05-27 LAB — CBC
HCT: 41.2 % (ref 39.0–52.0)
Hemoglobin: 14.2 g/dL (ref 13.0–17.0)
MCH: 31.3 pg (ref 26.0–34.0)
MCHC: 34.5 g/dL (ref 30.0–36.0)
MCV: 90.9 fL (ref 80.0–100.0)
Platelets: 144 10*3/uL — ABNORMAL LOW (ref 150–400)
RBC: 4.53 MIL/uL (ref 4.22–5.81)
RDW: 12.9 % (ref 11.5–15.5)
WBC: 8.4 10*3/uL (ref 4.0–10.5)
nRBC: 0 % (ref 0.0–0.2)

## 2021-05-27 LAB — COMPREHENSIVE METABOLIC PANEL
ALT: 26 U/L (ref 0–44)
AST: 24 U/L (ref 15–41)
Albumin: 4 g/dL (ref 3.5–5.0)
Alkaline Phosphatase: 63 U/L (ref 38–126)
Anion gap: 5 (ref 5–15)
BUN: 24 mg/dL — ABNORMAL HIGH (ref 8–23)
CO2: 29 mmol/L (ref 22–32)
Calcium: 9.1 mg/dL (ref 8.9–10.3)
Chloride: 106 mmol/L (ref 98–111)
Creatinine, Ser: 1.63 mg/dL — ABNORMAL HIGH (ref 0.61–1.24)
GFR, Estimated: 47 mL/min — ABNORMAL LOW (ref >60)
Glucose, Bld: 128 mg/dL — ABNORMAL HIGH (ref 70–99)
Potassium: 4.2 mmol/L (ref 3.5–5.1)
Sodium: 140 mmol/L (ref 135–145)
Total Bilirubin: 0.9 mg/dL (ref 0.3–1.2)
Total Protein: 7.1 g/dL (ref 6.5–8.1)

## 2021-05-27 LAB — URINALYSIS, ROUTINE W REFLEX MICROSCOPIC
Bilirubin Urine: NEGATIVE
Glucose, UA: NEGATIVE mg/dL
Ketones, ur: NEGATIVE mg/dL
Leukocytes,Ua: NEGATIVE
Nitrite: NEGATIVE
Protein, ur: NEGATIVE mg/dL
Specific Gravity, Urine: 1.025 (ref 1.005–1.030)
pH: 6 (ref 5.0–8.0)

## 2021-05-27 LAB — URINALYSIS, MICROSCOPIC (REFLEX): RBC / HPF: >50 RBC/hpf (ref 0–5)

## 2021-05-27 MED ORDER — ONDANSETRON 4 MG PO TBDP: 4.0000 mg | ORAL_TABLET | Freq: Three times a day (TID) | ORAL | 0 refills | Status: DC | PRN

## 2021-05-27 MED ORDER — HYDROMORPHONE HCL 1 MG/ML IJ SOLN
INTRAMUSCULAR | Status: AC
  Filled 2021-05-27: qty 1

## 2021-05-27 MED ORDER — HYDROMORPHONE HCL 1 MG/ML IJ SOLN
0.5000 mg | Freq: Once | INTRAMUSCULAR | Status: AC
  Administered 2021-05-27: 0.5 mg via INTRAVENOUS
  Filled 2021-05-27: qty 1

## 2021-05-27 MED ORDER — OXYCODONE-ACETAMINOPHEN 5-325 MG PO TABS
2.0000 | ORAL_TABLET | Freq: Once | ORAL | Status: AC
  Administered 2021-05-27: 2 via ORAL
  Filled 2021-05-27: qty 2

## 2021-05-27 MED ORDER — OXYCODONE-ACETAMINOPHEN 5-325 MG PO TABS: 2.0000 | ORAL_TABLET | ORAL | 0 refills | Status: DC | PRN

## 2021-05-27 MED ORDER — TAMSULOSIN HCL 0.4 MG PO CAPS: 0.4000 mg | ORAL_CAPSULE | Freq: Every day | ORAL | 0 refills | Status: DC

## 2021-05-27 MED ORDER — HYDROMORPHONE HCL 1 MG/ML IJ SOLN
1.0000 mg | Freq: Once | INTRAMUSCULAR | Status: AC
  Administered 2021-05-27: 0.5 mg via INTRAVENOUS

## 2021-05-27 MED ORDER — ONDANSETRON HCL 4 MG/2ML IJ SOLN
4.0000 mg | Freq: Once | INTRAMUSCULAR | Status: AC
  Administered 2021-05-27: 4 mg via INTRAVENOUS
  Filled 2021-05-27: qty 2

## 2021-05-27 NOTE — ED Notes (Addendum)
Pt denies any pain at present time.  Refused dilaudid

## 2021-05-27 NOTE — ED Provider Notes (Signed)
[facility] EMERGENCY DEPARTMENT Provider Note   CSN: [id number] Arrival date & time: 05/27/21  9892     History Chief Complaint  Patient presents with  . Flank Pain    BURHAN BARHAM is a 65 y.o. male.  65 year old male with past medical history below including CADs/p CABG, CKD, kidney stones, hyperlipidemia, GERD who presents with left flank pain.  Approximately 30 minutes prior to arrival, patient had sudden onset of severe left flank pain with associated nausea.  He took 1 g of Tylenol without improvement.  Pain is nonradiating.  No associated vomiting or urinary symptoms.  Pain feels similar to previous history of kidney stones.  He has required lithotripsy in the past but has not had problems with any stones in a few years.  No fevers or abdominal pain.  The history is provided by the patient.  Flank Pain      Past Medical History:  Diagnosis Date  . CAD (coronary artery disease), native coronary artery 02/16/2018   Cath 02/16/18    99% prox RCA, PDA 100% with collaterals, Ramus 60 and 80% prox, Prox LAD 95%, mid 70%, mid 100% with collaterals, ostial diagonal 90%, ostial circ 99%  CABG 02/18/18 Dr. Servando Snare with LIMA to LAD, SVG to Ramus, SVG to diag, SVG to RCA  . Chronic kidney disease with active medical management without dialysis, stage 3 (moderate) ([place]) 02/15/2018  . Coronary arteriosclerosis   . Coronary artery disease   . Gastroesophageal reflux disease   . GERD (gastroesophageal reflux disease)   . History of kidney stones   . Hyperlipidemia 02/15/2018  . Kidney disease, chronic, stage III (GFR 30-59 ml/min) ([place]) 02/15/2018  . Kidney stones   . Obesity (BMI 30-39.9) 02/15/2018  . Pure hypercholesterolemia 12/19/2020  . S/P CABG x 4 02/17/2018  . Unstable angina ([place]) 02/15/2018    Patient Active Problem List   Diagnosis Date Noted  . GERD (gastroesophageal reflux disease)   . Coronary artery disease   . Pure hypercholesterolemia 12/19/2020  . Kidney stones   .  History of kidney stones   . Gastroesophageal reflux disease   . Coronary arteriosclerosis   . S/P CABG x 4 02/17/2018  . CAD (coronary artery disease), native coronary artery 02/16/2018  . Unstable angina ([place]) 02/15/2018  . Hyperlipidemia 02/15/2018  . Chronic kidney disease with active medical management without dialysis, stage 3 (moderate) ([place]) 02/15/2018  . Obesity (BMI 30-39.9) 02/15/2018  . Kidney disease, chronic, stage III (GFR 30-59 ml/min) ([place]) 02/15/2018    Past Surgical History:  Procedure Laterality Date  . COLONOSCOPY  2007  . CORONARY ARTERY BYPASS GRAFT N/A 02/17/2018   Procedure: CORONARY ARTERY BYPASS GRAFTING (CABG) x 4 WITH ENDOSCOPIC HARVESTING OF RIGHT SAPHENOUS VEIN. LIMA TO LAD, SVG TO DISTAL RCA, SVG TO DIAGONAL, SVG TO INTERMEDIUS;  Surgeon: Grace Isaac, MD;  Location: [facility];  Service: Open Heart Surgery;  Laterality: N/A;  . dental implants    . EXTRACORPOREAL SHOCK WAVE LITHOTRIPSY Right 05/30/2019   Procedure: EXTRACORPOREAL SHOCK WAVE LITHOTRIPSY (ESWL);  Surgeon: Ardis Hughs, MD;  [facility];  Service: Urology;  Laterality: Right;  . LASIK     EYES  . LEFT HEART CATH AND CORONARY ANGIOGRAPHY N/A 02/16/2018   Procedure: LEFT HEART CATH AND CORONARY ANGIOGRAPHY;  Surgeon: Leonie Man, MD;  Location: [facility];  Service: Cardiovascular;  Laterality: N/A;  . LITHOTRIPSY    . POLYPECTOMY    . TEE WITHOUT  CARDIOVERSION N/A 02/17/2018   Procedure: TRANSESOPHAGEAL ECHOCARDIOGRAM (TEE);  Surgeon: Grace Isaac, MD;  Location: [facility];  Service: Open Heart Surgery;  Laterality: N/A;       Family History  Problem Relation Age of Onset  . Heart disease Mother   . Heart disease Father   . Renal Disease Sister   . Hyperlipidemia Sister   . Hyperlipidemia Sister   . Colon cancer Neg Hx        fam hx unknown- per pt "do not ask anything about my family"    Social History   Tobacco Use  . Smoking status: Former    Pack  years: 0.00  . Smokeless tobacco: Never  . Tobacco comments:    40 years ago  Vaping Use  . Vaping Use: Never used  Substance Use Topics  . Alcohol use: No  . Drug use: No    Home Medications Prior to Admission medications   Medication Sig Start Date End Date Taking? Authorizing Provider  ondansetron (ZOFRAN ODT) 4 MG disintegrating tablet Take 1 tablet (4 mg total) by mouth every 8 (eight) hours as needed for nausea or vomiting. 05/27/21  Yes Di Jasmer, Wenda Overland, MD  oxyCODONE-acetaminophen (PERCOCET) 5-325 MG tablet Take 2 tablets by mouth every 4 (four) hours as needed for severe pain. 05/27/21  Yes Tamario Heal, Wenda Overland, MD  tamsulosin (FLOMAX) 0.4 MG CAPS capsule Take 1 capsule (0.4 mg total) by mouth daily. 05/27/21  Yes Saphire Barnhart, Wenda Overland, MD  aspirin EC 81 MG tablet Take 81 mg by mouth daily. Swallow whole.    [provider]  atorvastatin (LIPITOR) 80 MG tablet TAKE 1 TABLET(80 MG) BY MOUTH DAILY AT 6 PM 05/03/21   Park Liter, MD  ezetimibe (ZETIA) 10 MG tablet TAKE 1 TABLET(10 MG) BY MOUTH DAILY 01/10/21   Park Liter, MD  metoprolol succinate (TOPROL-XL) 50 MG 24 hr tablet TAKE 1 TABLET BY MOUTH DAILY. TAKE WITH OR IMMEDIATELY FOLLOWING A MEAL 03/13/21   Park Liter, MD  MODERNA COVID-19 VACCINE 100 MCG/0.5ML injection INJECT AS DIRECTED 12/21/20 12/21/21  Carlyle Basques, MD    Allergies    Patient has no known allergies.  Review of Systems   Review of Systems  Genitourinary:  Positive for flank pain.  All other systems reviewed and are negative except that which was mentioned in HPI  Physical Exam Updated Vital Signs BP 130/75 (BP Location: Right Arm)   Pulse 67   Temp 98.1 F (36.7 C) (Oral)   Resp 18   Ht 5\' 9"  (1.753 m)   Wt 91.6 kg   SpO2 95%   BMI 29.83 kg/m   Physical Exam Vitals and nursing note reviewed.  Constitutional:      General: He is not in acute distress.    Appearance: Normal appearance.     Comments: Eyes  closed, uncomfortable  HENT:     Head: Normocephalic and atraumatic.  Eyes:     Conjunctiva/sclera: Conjunctivae normal.  Cardiovascular:     Rate and Rhythm: Normal rate and regular rhythm.     Heart sounds: Normal heart sounds. No murmur heard. Pulmonary:     Effort: Pulmonary effort is normal.     Breath sounds: Normal breath sounds.  Abdominal:     General: Abdomen is flat. Bowel sounds are normal. There is no distension.     Palpations: Abdomen is soft.     Tenderness: There is no abdominal tenderness. There is no left CVA tenderness.  Musculoskeletal:     Right lower leg: No edema.     Left lower leg: No edema.  Skin:    General: Skin is warm and dry.  Neurological:     Mental Status: He is alert and oriented to person, place, and time.     Comments: fluent  Psychiatric:        Mood and Affect: Mood normal.        Behavior: Behavior normal.    ED Results / Procedures / Treatments   Labs (all labs ordered are listed, but only abnormal results are displayed) Labs Reviewed  COMPREHENSIVE METABOLIC PANEL - Abnormal; Notable for the following components:      Result Value   Glucose, Bld 128 (*)    BUN 24 (*)    Creatinine, Ser 1.63 (*)    GFR, Estimated 47 (*)    All other components within normal limits  CBC - Abnormal; Notable for the following components:   Platelets 144 (*)    All other components within normal limits  URINALYSIS, ROUTINE W REFLEX MICROSCOPIC - Abnormal; Notable for the following components:   Hgb urine dipstick LARGE (*)    All other components within normal limits  URINALYSIS, MICROSCOPIC (REFLEX) - Abnormal; Notable for the following components:   Bacteria, UA FEW (*)    All other components within normal limits    EKG None  Radiology DG Abd 1 View  Result Date: 05/27/2021 CLINICAL DATA:  Left flank pain, history of kidney stones. EXAM: ABDOMEN - 1 VIEW COMPARISON:  None. FINDINGS: Nonobstructive bowel gas pattern. There is a 5 mm  calculus overlying the left kidney. There is a 5 mm calcification overlying the left psoas/course of the left ureter. Unchanged pelvic phleboliths. No acute osseous abnormality. IMPRESSION: 5 mm calcification overlying the course of the left proximal ureter, possibly ureterolithiasis. Additional 5 mm calculus overlying the left kidney. Electronically Signed   By: Maurine Simmering   On: 05/27/2021 12:04   US Renal  Result Date: 05/27/2021 CLINICAL DATA:  Left flank pain EXAM: RENAL / URINARY TRACT ULTRASOUND COMPLETE COMPARISON:  CT abdomen pelvis 05/17/2019 FINDINGS: Right Kidney: Renal measurements: 9.8 x 5.5 x 5.3 cm = volume: 149 mL. 2.2 cm left renal cyst. Echogenicity within normal limits. No mass or hydronephrosis visualized. Left Kidney: Renal measurements: 11.1 x 6.0 x 4.8 cm = volume: 168 mL. Mild left hydronephrosis. No calculus identified. Bladder: Small amount of urine in the bladder without focal abnormality. Other: None. IMPRESSION: Mild left hydronephrosis which could be due to distal obstruction. 2.2 cm right renal calculus. Electronically Signed   By: Franchot Gallo M.D.   On: 05/27/2021 12:30    Procedures Procedures   Medications Ordered in ED Medications  HYDROmorphone (DILAUDID) 1 MG/ML injection (has no administration in time range)  oxyCODONE-acetaminophen (PERCOCET/ROXICET) 5-325 MG per tablet 2 tablet (has no administration in time range)  HYDROmorphone (DILAUDID) injection 0.5 mg (0.5 mg Intravenous Given 05/27/21 1043)  ondansetron (ZOFRAN) injection 4 mg (4 mg Intravenous Given 05/27/21 1038)  HYDROmorphone (DILAUDID) injection 1 mg (0.5 mg Intravenous Given 05/27/21 1315)    ED Course  I have reviewed the triage vital signs and the nursing notes.  Pertinent labs & imaging results that were available during my care of the patient were reviewed by me and considered in my medical decision making (see chart for details).    MDM Rules/Calculators/A&P  Uncomfortable on exam, afebrile, hypertensive.  No focal abdominal tenderness.  Patient states that pain feels similar to previous stones, therefore obtained KUB and ultrasound to minimize radiation exposure.  Gave Dilaudid and Zofran for symptoms.  Lab work shows creatinine of 1.6 which is similar to previous in 2020.  Normal WBC count, UA with hematuria but no evidence of infection.  Renal ultrasound shows mild left hydronephrosis and KUB shows a 5 mm stone in proximal ureter.  Patient's symptoms well controlled on reassessment after above medications.  He feels comfortable [facility].  I have provided with pain medication, Zofran, and Flomax to use at home.  Extensively reviewed return precautions and instructed him to come to [facility] ED if he has intractable pain, intractable vomiting, or fever.  He voiced understanding. Final Clinical Impression(s) / ED Diagnoses Final diagnoses:  Ureterolithiasis    Rx / DC Orders ED Discharge Orders          Ordered    oxyCODONE-acetaminophen (PERCOCET) 5-325 MG tablet  Every 4 hours PRN        05/27/21 1434    ondansetron (ZOFRAN ODT) 4 MG disintegrating tablet  Every 8 hours PRN        05/27/21 1434    tamsulosin (FLOMAX) 0.4 MG CAPS capsule  Daily        05/27/21 1434             Ricky Doan, Wenda Overland, MD 05/27/21 1436

## 2021-05-27 NOTE — ED Triage Notes (Signed)
Left flank pain that started aprpox 30 min ago, reports nausea, took 1000mg  tylenol approx 30 min ago.  H/o kindey stone with lithotrpsy x 2

## 2021-05-28 DIAGNOSIS — N201 Calculus of ureter: Secondary | ICD-10-CM | POA: Diagnosis not present

## 2021-06-02 ENCOUNTER — Other Ambulatory Visit: Payer: Self-pay | Admitting: Cardiology

## 2021-06-03 NOTE — Telephone Encounter (Signed)
Refill sent to pharmacy.   

## 2021-06-20 DIAGNOSIS — N202 Calculus of kidney with calculus of ureter: Secondary | ICD-10-CM | POA: Diagnosis not present

## 2021-07-16 ENCOUNTER — Telehealth: Payer: Self-pay

## 2021-07-16 NOTE — Telephone Encounter (Signed)
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 If you need any additional information please give him a call at [phone]

## 2021-07-24 DIAGNOSIS — N201 Calculus of ureter: Secondary | ICD-10-CM | POA: Diagnosis not present

## 2021-07-24 DIAGNOSIS — N1330 Unspecified hydronephrosis: Secondary | ICD-10-CM | POA: Diagnosis not present

## 2021-07-24 DIAGNOSIS — N132 Hydronephrosis with renal and ureteral calculous obstruction: Secondary | ICD-10-CM | POA: Diagnosis not present

## 2021-07-26 DIAGNOSIS — N200 Calculus of kidney: Secondary | ICD-10-CM | POA: Diagnosis not present

## 2021-08-05 DIAGNOSIS — R310 Gross hematuria: Secondary | ICD-10-CM | POA: Diagnosis not present

## 2021-08-05 DIAGNOSIS — E7800 Pure hypercholesterolemia, unspecified: Secondary | ICD-10-CM | POA: Diagnosis not present

## 2021-08-05 DIAGNOSIS — R102 Pelvic and perineal pain: Secondary | ICD-10-CM | POA: Diagnosis not present

## 2021-08-05 DIAGNOSIS — N200 Calculus of kidney: Secondary | ICD-10-CM | POA: Diagnosis not present

## 2021-08-05 DIAGNOSIS — N410 Acute prostatitis: Secondary | ICD-10-CM | POA: Diagnosis not present

## 2021-08-05 DIAGNOSIS — Z125 Encounter for screening for malignant neoplasm of prostate: Secondary | ICD-10-CM | POA: Diagnosis not present

## 2021-08-05 DIAGNOSIS — Z0000 Encounter for general adult medical examination without abnormal findings: Secondary | ICD-10-CM | POA: Diagnosis not present

## 2021-08-28 DIAGNOSIS — N202 Calculus of kidney with calculus of ureter: Secondary | ICD-10-CM | POA: Diagnosis not present

## 2021-08-28 DIAGNOSIS — N281 Cyst of kidney, acquired: Secondary | ICD-10-CM | POA: Diagnosis not present

## 2021-09-17 DIAGNOSIS — N411 Chronic prostatitis: Secondary | ICD-10-CM | POA: Diagnosis not present

## 2021-09-18 DIAGNOSIS — L814 Other melanin hyperpigmentation: Secondary | ICD-10-CM | POA: Insufficient documentation

## 2021-09-18 DIAGNOSIS — L409 Psoriasis, unspecified: Secondary | ICD-10-CM | POA: Insufficient documentation

## 2021-09-18 DIAGNOSIS — D225 Melanocytic nevi of trunk: Secondary | ICD-10-CM | POA: Insufficient documentation

## 2021-09-18 DIAGNOSIS — D1800 Hemangioma unspecified site: Secondary | ICD-10-CM | POA: Insufficient documentation

## 2021-09-18 DIAGNOSIS — L821 Other seborrheic keratosis: Secondary | ICD-10-CM | POA: Insufficient documentation

## 2021-09-19 ENCOUNTER — Encounter: Payer: Self-pay | Admitting: Cardiology

## 2021-09-19 ENCOUNTER — Other Ambulatory Visit: Payer: Self-pay

## 2021-09-19 ENCOUNTER — Ambulatory Visit: Payer: Medicare Other | Admitting: Cardiology

## 2021-09-19 VITALS — BP 108/62 | HR 66 | Ht 68.0 in | Wt 206.0 lb

## 2021-09-19 DIAGNOSIS — Z951 Presence of aortocoronary bypass graft: Secondary | ICD-10-CM

## 2021-09-19 DIAGNOSIS — N183 Chronic kidney disease, stage 3 unspecified: Secondary | ICD-10-CM | POA: Diagnosis not present

## 2021-09-19 DIAGNOSIS — I2510 Atherosclerotic heart disease of native coronary artery without angina pectoris: Secondary | ICD-10-CM

## 2021-09-19 DIAGNOSIS — E7800 Pure hypercholesterolemia, unspecified: Secondary | ICD-10-CM | POA: Diagnosis not present

## 2021-09-19 NOTE — Progress Notes (Signed)
Cardiology Office Note:    Date:  09/19/2021   ID:  Todd Buck, DOB 1956-05-27, MRN [id number]  PCP:  Shirline Frees, MD  Cardiologist:  Jenne Campus, MD    Referring MD: Shirline Frees, MD   Chief Complaint  Patient presents with   Follow-up  Am doing fine  History of Present Illness:    Todd Buck is a 65 y.o. male with past medical significant for coronary artery bypass graft.  In March 2019 he required coronary bypass graft with LIMA to LAD SVG to ramus and SVG to diagonal branch and SVG to RCA.  His ejection fraction was normal.  He also got dyslipidemia.  He comes today 2 months for follow-up overall he is doing very well he has no issues no shortness of breath chest pain tightness squeezing pressure burning chest no swelling of lower extremities no palpitations overall stable.  He works physically have no difficulty doing it.  He is planning a trip to [place] like every year for elk hunting  Past Medical History:  Diagnosis Date   CAD (coronary artery disease), native coronary artery 02/16/2018   Cath 02/16/18    99% prox RCA, PDA 100% with collaterals, Ramus 60 and 80% prox, Prox LAD 95%, mid 70%, mid 100% with collaterals, ostial diagonal 90%, ostial circ 99%  CABG 02/18/18 Dr. Servando Snare with LIMA to LAD, SVG to Ramus, SVG to diag, SVG to RCA   Chronic kidney disease with active medical management without dialysis, stage 3 (moderate) ([place]) 02/15/2018   Coronary arteriosclerosis    Coronary artery disease    Gastroesophageal reflux disease    GERD (gastroesophageal reflux disease)    History of kidney stones    Hyperlipidemia 02/15/2018   Kidney disease, chronic, stage III (GFR 30-59 ml/min) ([place]) 02/15/2018   Kidney stones    Obesity (BMI 30-39.9) 02/15/2018   Pure hypercholesterolemia 12/19/2020   S/P CABG x 4 02/17/2018   Unstable angina ([place]) 02/15/2018    Past Surgical History:  Procedure Laterality Date   COLONOSCOPY  2007   CORONARY ARTERY BYPASS GRAFT N/A 02/17/2018    Procedure: CORONARY ARTERY BYPASS GRAFTING (CABG) x 4 WITH ENDOSCOPIC HARVESTING OF RIGHT SAPHENOUS VEIN. LIMA TO LAD, SVG TO DISTAL RCA, SVG TO DIAGONAL, SVG TO INTERMEDIUS;  Surgeon: Grace Isaac, MD;  Location: [facility];  Service: Open Heart Surgery;  Laterality: N/A;   dental implants     EXTRACORPOREAL SHOCK WAVE LITHOTRIPSY Right 05/30/2019   Procedure: EXTRACORPOREAL SHOCK WAVE LITHOTRIPSY (ESWL);  Surgeon: Ardis Hughs, MD;  [facility];  Service: Urology;  Laterality: Right;   LASIK     EYES   LEFT HEART CATH AND CORONARY ANGIOGRAPHY N/A 02/16/2018   Procedure: LEFT HEART CATH AND CORONARY ANGIOGRAPHY;  Surgeon: Leonie Man, MD;  Location: [facility];  Service: Cardiovascular;  Laterality: N/A;   LITHOTRIPSY     POLYPECTOMY     TEE WITHOUT CARDIOVERSION N/A 02/17/2018   Procedure: TRANSESOPHAGEAL ECHOCARDIOGRAM (TEE);  Surgeon: Grace Isaac, MD;  Location: [facility];  Service: Open Heart Surgery;  Laterality: N/A;    Current Medications: Current Meds  Medication Sig   aspirin EC 81 MG tablet Take 81 mg by mouth daily. Swallow whole.   atorvastatin (LIPITOR) 80 MG tablet TAKE 1 TABLET(80 MG) BY MOUTH DAILY AT 6 PM (Patient taking differently: Take 80 mg by mouth daily.)   ezetimibe (ZETIA) 10 MG tablet TAKE 1 TABLET(10 MG) BY MOUTH DAILY (Patient  taking differently: Take 10 mg by mouth daily.)   metoprolol succinate (TOPROL-XL) 50 MG 24 hr tablet TAKE 1 TABLET BY MOUTH DAILY WITH OR IMMEDIATELY FOLLOWING A MEAL (Patient taking differently: Take 50 mg by mouth daily. TAKE 1 TABLET BY MOUTH DAILY WITH OR IMMEDIATELY FOLLOWING A MEAL)   MODERNA COVID-19 VACCINE 100 MCG/0.5ML injection INJECT AS DIRECTED (Patient taking differently: Inject 0.5 mLs as directed once.)   ondansetron (ZOFRAN ODT) 4 MG disintegrating tablet Take 1 tablet (4 mg total) by mouth every 8 (eight) hours as needed for nausea or vomiting.   tamsulosin (FLOMAX) 0.4 MG CAPS capsule Take 1  capsule (0.4 mg total) by mouth daily.     Allergies:   Patient has no known allergies.   Social History   Socioeconomic History   Marital status: Married    Spouse name: Not on file   Number of children: Not on file   Years of education: Not on file   Highest education level: Not on file  Occupational History   Not on file  Tobacco Use   Smoking status: Former   Smokeless tobacco: Never   Tobacco comments:    40 years ago  Vaping Use   Vaping Use: Never used  Substance and Sexual Activity   Alcohol use: No   Drug use: No   Sexual activity: Yes  Other Topics Concern   Not on file  Social History Narrative   Not on file   Social Determinants of Health   Financial Resource Strain: Not on file  Food Insecurity: Not on file  Transportation Needs: Not on file  Physical Activity: Not on file  Stress: Not on file  Social Connections: Not on file     Family History: The patient's family history includes Heart disease in his father and mother; Hyperlipidemia in his sister and sister; Renal Disease in his sister. There is no history of Colon cancer. ROS:   Please see the history of present illness.    All 14 point review of systems negative except as described per history of present illness  EKGs/Labs/Other Studies Reviewed:      Recent Labs: 05/27/2021: ALT 26; BUN 24; Creatinine, Ser 1.63; Hemoglobin 14.2; Platelets 144; Potassium 4.2; Sodium 140  Recent Lipid Panel    Component Value Date/Time   CHOL 108 06/22/2020 0833   TRIG 91 06/22/2020 0833   HDL 40 06/22/2020 0833   CHOLHDL 2.7 06/22/2020 0833   CHOLHDL 5.2 02/15/2018 1646   VLDL 50 (H) 02/15/2018 1646   LDLCALC 50 06/22/2020 0833    Physical Exam:    VS:  BP 108/62 (BP Location: Right Arm, Patient Position: Sitting)   Pulse 66   Ht 5\' 8"  (1.727 m)   Wt 206 lb (93.4 kg)   SpO2 98%   BMI 31.32 kg/m     Wt Readings from Last 3 Encounters:  09/19/21 206 lb (93.4 kg)  05/27/21 202 lb (91.6 kg)   05/01/21 204 lb (92.5 kg)     GEN:  Well nourished, well developed in no acute distress HEENT: Normal NECK: No JVD; No carotid bruits LYMPHATICS: No lymphadenopathy CARDIAC: RRR, no murmurs, no rubs, no gallops RESPIRATORY:  Clear to auscultation without rales, wheezing or rhonchi  ABDOMEN: Soft, non-tender, non-distended MUSCULOSKELETAL:  No edema; No deformity  SKIN: Warm and dry LOWER EXTREMITIES: no swelling NEUROLOGIC:  Alert and oriented x 3 PSYCHIATRIC:  Normal affect   ASSESSMENT:    1. Coronary artery disease involving native coronary  artery of native heart without angina pectoris   2. Chronic kidney disease with active medical management without dialysis, stage 3 (moderate) ([place])   3. S/P CABG x 4   4. Pure hypercholesterolemia    PLAN:    In order of problems listed above:  Coronary disease status post coronary bypass graft stable from that point review.  Stress test done in summer reviewed with the patient showing no evidence of ischemia he is on antiplatelet therapy in form of aspirin which I will continue at all appropriate medications. He today shows stable last creatinine I have is from my summer which is 1.86.  That being followed by antimedicine team. Dyslipidemia I did review his fasting lipid profile which show LDL of 48 HDL 44 this is on Lipitor 80 and Zetia 10 we will continue present management.   Medication Adjustments/Labs and Tests Ordered: Current medicines are reviewed at length with the patient today.  Concerns regarding medicines are outlined above.  No orders of the defined types were placed in this encounter.  Medication changes: No orders of the defined types were placed in this encounter.   Signed, Park Liter, MD, [facility] 09/19/2021 8:51 AM    [place]

## 2021-09-19 NOTE — Patient Instructions (Signed)

## 2021-10-15 ENCOUNTER — Other Ambulatory Visit: Payer: Self-pay | Admitting: Cardiology

## 2021-10-29 ENCOUNTER — Other Ambulatory Visit: Payer: Self-pay | Admitting: Cardiology

## 2021-11-26 DIAGNOSIS — L814 Other melanin hyperpigmentation: Secondary | ICD-10-CM | POA: Diagnosis not present

## 2021-11-26 DIAGNOSIS — D225 Melanocytic nevi of trunk: Secondary | ICD-10-CM | POA: Diagnosis not present

## 2021-11-26 DIAGNOSIS — L821 Other seborrheic keratosis: Secondary | ICD-10-CM | POA: Diagnosis not present

## 2021-11-26 DIAGNOSIS — L578 Other skin changes due to chronic exposure to nonionizing radiation: Secondary | ICD-10-CM | POA: Diagnosis not present

## 2021-12-10 ENCOUNTER — Telehealth: Payer: Self-pay | Admitting: Cardiology

## 2021-12-10 ENCOUNTER — Other Ambulatory Visit: Payer: Self-pay

## 2021-12-10 ENCOUNTER — Encounter (HOSPITAL_BASED_OUTPATIENT_CLINIC_OR_DEPARTMENT_OTHER): Payer: Self-pay | Admitting: Emergency Medicine

## 2021-12-10 ENCOUNTER — Emergency Department (HOSPITAL_BASED_OUTPATIENT_CLINIC_OR_DEPARTMENT_OTHER)
Admission: EM | Admit: 2021-12-10 | Discharge: 2021-12-10 | Disposition: A | Discharge status: Home / Self Care | Payer: Medicare Other | Attending: Emergency Medicine | Admitting: Emergency Medicine

## 2021-12-10 ENCOUNTER — Emergency Department (HOSPITAL_BASED_OUTPATIENT_CLINIC_OR_DEPARTMENT_OTHER): Payer: Medicare Other

## 2021-12-10 DIAGNOSIS — Z7982 Long term (current) use of aspirin: Secondary | ICD-10-CM | POA: Insufficient documentation

## 2021-12-10 DIAGNOSIS — R000 Tachycardia, unspecified: Secondary | ICD-10-CM | POA: Diagnosis not present

## 2021-12-10 DIAGNOSIS — Z951 Presence of aortocoronary bypass graft: Secondary | ICD-10-CM | POA: Diagnosis not present

## 2021-12-10 DIAGNOSIS — K219 Gastro-esophageal reflux disease without esophagitis: Secondary | ICD-10-CM | POA: Insufficient documentation

## 2021-12-10 DIAGNOSIS — N183 Chronic kidney disease, stage 3 unspecified: Secondary | ICD-10-CM | POA: Diagnosis not present

## 2021-12-10 DIAGNOSIS — Z87891 Personal history of nicotine dependence: Secondary | ICD-10-CM | POA: Diagnosis not present

## 2021-12-10 DIAGNOSIS — I2510 Atherosclerotic heart disease of native coronary artery without angina pectoris: Secondary | ICD-10-CM | POA: Insufficient documentation

## 2021-12-10 DIAGNOSIS — R079 Chest pain, unspecified: Secondary | ICD-10-CM | POA: Diagnosis not present

## 2021-12-10 DIAGNOSIS — R0789 Other chest pain: Secondary | ICD-10-CM | POA: Insufficient documentation

## 2021-12-10 LAB — CBC WITH DIFFERENTIAL/PLATELET
Abs Immature Granulocytes: 0.01 10*3/uL (ref 0.00–0.07)
Basophils Absolute: 0.1 10*3/uL (ref 0.0–0.1)
Basophils Relative: 1 %
Eosinophils Absolute: 0.4 10*3/uL (ref 0.0–0.5)
Eosinophils Relative: 5 %
HCT: 39.9 % (ref 39.0–52.0)
Hemoglobin: 13.8 g/dL (ref 13.0–17.0)
Immature Granulocytes: 0 %
Lymphocytes Relative: 31 %
Lymphs Abs: 2.2 10*3/uL (ref 0.7–4.0)
MCH: 31.6 pg (ref 26.0–34.0)
MCHC: 34.6 g/dL (ref 30.0–36.0)
MCV: 91.3 fL (ref 80.0–100.0)
Monocytes Absolute: 0.7 10*3/uL (ref 0.1–1.0)
Monocytes Relative: 10 %
Neutro Abs: 3.8 10*3/uL (ref 1.7–7.7)
Neutrophils Relative %: 53 %
Platelets: 168 10*3/uL (ref 150–400)
RBC: 4.37 MIL/uL (ref 4.22–5.81)
RDW: 12.6 % (ref 11.5–15.5)
WBC: 7.1 10*3/uL (ref 4.0–10.5)
nRBC: 0 % (ref 0.0–0.2)

## 2021-12-10 LAB — COMPREHENSIVE METABOLIC PANEL
ALT: 29 U/L (ref 0–44)
AST: 27 U/L (ref 15–41)
Albumin: 3.9 g/dL (ref 3.5–5.0)
Alkaline Phosphatase: 66 U/L (ref 38–126)
Anion gap: 8 (ref 5–15)
BUN: 23 mg/dL (ref 8–23)
CO2: 26 mmol/L (ref 22–32)
Calcium: 8.4 mg/dL — ABNORMAL LOW (ref 8.9–10.3)
Chloride: 104 mmol/L (ref 98–111)
Creatinine, Ser: 1.5 mg/dL — ABNORMAL HIGH (ref 0.61–1.24)
GFR, Estimated: 51 mL/min — ABNORMAL LOW (ref >60)
Glucose, Bld: 124 mg/dL — ABNORMAL HIGH (ref 70–99)
Potassium: 3.1 mmol/L — ABNORMAL LOW (ref 3.5–5.1)
Sodium: 138 mmol/L (ref 135–145)
Total Bilirubin: 1 mg/dL (ref 0.3–1.2)
Total Protein: 6.9 g/dL (ref 6.5–8.1)

## 2021-12-10 LAB — TROPONIN I (HIGH SENSITIVITY)
Troponin I (High Sensitivity): 3 ng/L (ref <18)
Troponin I (High Sensitivity): 4 ng/L (ref <18)

## 2021-12-10 MED ORDER — POTASSIUM CHLORIDE CRYS ER 20 MEQ PO TBCR
40.0000 meq | EXTENDED_RELEASE_TABLET | Freq: Once | ORAL | Status: AC
  Administered 2021-12-10: 04:00:00 40 meq via ORAL
  Filled 2021-12-10: qty 2

## 2021-12-10 NOTE — ED Triage Notes (Signed)
Pt states he started feeling like his heart was racing and heaviness in his chest x 30 min. Denies shob, nausea and radiation.

## 2021-12-10 NOTE — ED Provider Notes (Signed)
[facility] EMERGENCY DEPARTMENT Provider Note   CSN: [id number] Arrival date & time: 12/10/21  0128     History Chief Complaint  Patient presents with   Chest Pain    Todd Buck is a 65 y.o. male.  The history is provided by the patient and medical records.  Chest Pain Todd Buck is a 64 y.o. male who presents to the Emergency Department complaining of chest pain.  He presents to the ED for evaluation of chest pressure and rapid heart rate, felt cold. Sxs woke him from sleep at [place] a baby aspirin.  Sxs resolved on ED arrival.  Similar sxs in the past prior to needing CABG (3/19).  No associated SOB, nausea, diaphoresis, leg swelling/pain.  No recent activity intolerance.      Past Medical History:  Diagnosis Date   CAD (coronary artery disease), native coronary artery 02/16/2018   Cath 02/16/18    99% prox RCA, PDA 100% with collaterals, Ramus 60 and 80% prox, Prox LAD 95%, mid 70%, mid 100% with collaterals, ostial diagonal 90%, ostial circ 99%  CABG 02/18/18 Dr. 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   Unstable angina ([place]) 02/15/2018    Patient Active Problem List   Diagnosis Date Noted   Psoriasis 09/18/2021   Other seborrheic keratosis 09/18/2021   Melanocytic nevi of trunk 09/18/2021   Lentigo 09/18/2021   Hemangioma 09/18/2021   GERD (gastroesophageal reflux disease)    Coronary artery disease    Pure hypercholesterolemia 12/19/2020   Kidney stones     History of kidney stones    Gastroesophageal reflux disease    Coronary arteriosclerosis    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 LIMA TO LAD, SVG TO DISTAL RCA, SVG TO DIAGONAL, SVG TO INTERMEDIUS;  Surgeon: Grace Isaac, MD;  Location: [facility];  Service: Open Heart Surgery;  Laterality: N/A;   dental implants     EXTRACORPOREAL SHOCK WAVE LITHOTRIPSY Right 05/30/2019   Procedure: EXTRACORPOREAL SHOCK WAVE LITHOTRIPSY (ESWL);  Surgeon: Ardis Hughs, MD;  Location[facility];  Service: Urology;  Laterality: Right;   LASIK     EYES   LEFT HEART CATH AND CORONARY ANGIOGRAPHY N/A 02/16/2018   Procedure: LEFT HEART CATH AND CORONARY ANGIOGRAPHY;  Surgeon: Leonie Man, MD;  Location: [facility];  Service: Cardiovascular;  Laterality: N/A;   LITHOTRIPSY  POLYPECTOMY     TEE WITHOUT CARDIOVERSION N/A 02/17/2018   Procedure: TRANSESOPHAGEAL ECHOCARDIOGRAM (TEE);  Surgeon: Grace Isaac, MD;  Location: [facility];  Service: Open Heart Surgery;  Laterality: N/A;       Family History  Problem Relation Age of Onset   Heart disease Mother    Heart disease Father    Renal Disease Sister    Hyperlipidemia Sister    Hyperlipidemia Sister    Colon cancer Neg Hx        fam hx unknown- per pt "do not ask anything about my family"    Social History   Tobacco Use   Smoking status: Former   Smokeless tobacco: Never    Tobacco comments:    40 years ago  Vaping Use   Vaping Use: Never used  Substance Use Topics   Alcohol use: No   Drug use: No    Home Medications Prior to Admission medications   Medication Sig Start Date End Date Taking? Authorizing Provider  aspirin EC 81 MG tablet Take 81 mg by mouth daily. Swallow whole.    [provider]  atorvastatin (LIPITOR) 80 MG tablet Take 1 tablet (80 mg total) by mouth daily. 10/29/21   Park Liter, MD  ezetimibe (ZETIA) 10 MG tablet TAKE 1 TABLET(10 MG) BY MOUTH DAILY 10/15/21   Park Liter, MD  metoprolol succinate (TOPROL-XL) 50 MG 24 hr tablet TAKE 1 TABLET BY MOUTH DAILY WITH OR IMMEDIATELY FOLLOWING A MEAL Patient taking differently: Take 50 mg by mouth daily. TAKE 1 TABLET BY MOUTH DAILY WITH OR IMMEDIATELY FOLLOWING A MEAL 06/03/21   Park Liter, MD  MODERNA COVID-19 VACCINE 100 MCG/0.5ML injection INJECT AS DIRECTED Patient taking differently: Inject 0.5 mLs as directed once. 12/21/20 12/21/21  Carlyle Basques, MD  ondansetron (ZOFRAN ODT) 4 MG disintegrating tablet Take 1 tablet (4 mg total) by mouth every 8 (eight) hours as needed for nausea or vomiting. 05/27/21   Little, Wenda Overland, MD  tamsulosin (FLOMAX) 0.4 MG CAPS capsule Take 1 capsule (0.4 mg total) by mouth daily. 05/27/21   Little, Wenda Overland, MD    Allergies    Patient has no known allergies.  Review of Systems   Review of Systems  Cardiovascular:  Positive for chest pain.  All other systems reviewed and are negative.  Physical Exam Updated Vital Signs BP 134/79    Pulse (!) 58    Temp 97.6 F (36.4 C) (Oral)    Resp 15    Ht 5\' 8"  (1.727 m)    Wt 90.7 kg    SpO2 97%    BMI 30.41 kg/m   Physical Exam Vitals and nursing note reviewed.  Constitutional:      Appearance: He is well-developed.  HENT:     Head: Normocephalic and atraumatic.  Cardiovascular:     Rate and Rhythm: Normal rate and regular rhythm.     Heart sounds: No murmur  heard. Pulmonary:     Effort: Pulmonary effort is normal. No respiratory distress.     Breath sounds: Normal breath sounds.  Abdominal:     Palpations: Abdomen is soft.     Tenderness: There is no abdominal tenderness. There is no guarding or rebound.  Musculoskeletal:        General: No swelling or tenderness.  Skin:    General: Skin is warm and dry.  Neurological:     Mental Status: He is alert and oriented to person, place, and time.  Psychiatric:        Behavior: Behavior normal.    ED Results / Procedures / Treatments   Labs (all labs ordered are listed, but only abnormal results are displayed) Labs Reviewed  COMPREHENSIVE METABOLIC PANEL - Abnormal; Notable for the following components:      Result Value   Potassium 3.1 (*)    Glucose, Bld 124 (*)    Creatinine, Ser 1.50 (*)    Calcium 8.4 (*)    GFR, Estimated 51 (*)    All other components within normal limits  CBC WITH DIFFERENTIAL/PLATELET  TROPONIN I (HIGH SENSITIVITY)  TROPONIN I (HIGH SENSITIVITY)    EKG EKG Interpretation  Date/Time:  Tuesday December 10 2021 01:35:54 EST Ventricular Rate:  77 PR Interval:  185 QRS Duration: 168 QT Interval:  426 QTC Calculation: 483 R Axis:   46 Text Interpretation: Sinus rhythm Left bundle branch block LBBB present on prior tracings Confirmed by Quintella Reichert [phone]) on 12/10/2021 1:40:37 AM  Radiology DG Chest Portable 1 View  Result Date: 12/10/2021 CLINICAL DATA:  Chest pain and tachycardia EXAM: PORTABLE CHEST 1 VIEW COMPARISON:  03/22/2018 FINDINGS: Cardiac shadow is stable. Postsurgical changes are again seen. Lungs are clear bilaterally. No focal infiltrate or effusion is noted. No bony abnormality is seen. IMPRESSION: No active disease. Electronically Signed   By: Inez Catalina M.D.   On: 12/10/2021 01:50    Procedures Procedures   Medications Ordered in ED Medications  potassium chloride SA (KLOR-CON M) CR tablet 40 mEq (has no administration in time  range)    ED Course  I have reviewed the triage vital signs and the nursing notes.  Pertinent labs & imaging results that were available during my care of the patient were reviewed by me and considered in my medical decision making (see chart for details).    MDM Rules/Calculators/A&P                         Patient with history of coronary artery disease status post CABG in 2019 here for evaluation of rapid heart rate and chest pressure that occurred prior to ED arrival.  His symptoms did resolve on ED arrival.  EKG with left bundle branch block, similar when compared to priors.  Troponins are negative x2.  He has been asymptomatic during his ED stay.  Chest x-ray without acute abnormality.  Presentation is not consistent with ACS, dissection, PE, pneumonia.  Labs with hypokalemia, will replace orally.  Do not feel hyperkalemia is driving his symptoms.  Discussed with patient unclear source of symptoms, feel he is stable for cardiology follow-up with return precautions.    Final Clinical Impression(s) / ED Diagnoses Final diagnoses:  Nonspecific chest pain    Rx / DC Orders ED Discharge Orders     None        Quintella Reichert, MD 12/10/21 0425

## 2021-12-10 NOTE — Telephone Encounter (Signed)
Patient's wife called to inform Dr. Agustin Cree of recent hospital admission (12/27). She is hopeful that he will review ED notes and give feedback/recommendations. Patient has also been scheduled for 02/05/22 with Dr. Agustin Cree, but wife would like to know if patient needs sooner appointment. Please advise.

## 2021-12-10 NOTE — Telephone Encounter (Signed)
Lvmtcb 12/27

## 2021-12-12 NOTE — Telephone Encounter (Signed)
Upon your return please review the pt's most recent ED visit to see if he requires an earlier appointment. Pt states that he feels great since being dc'd.

## 2021-12-17 NOTE — Telephone Encounter (Signed)
Left message for patient to return call.

## 2021-12-18 NOTE — Telephone Encounter (Signed)
Patient informed per Dr. Agustin Cree to wear a 14 day zio. Patient doesn't want to do this. Also asked patient if he was having any chest pain while moving around - if so he needed a sooner follow up. He states he was note and he has been working and exerting himself without issues. He will let us know if he changes he mind on the monitor.

## 2021-12-19 ENCOUNTER — Telehealth: Payer: Self-pay | Admitting: Cardiology

## 2021-12-19 DIAGNOSIS — R0609 Other forms of dyspnea: Secondary | ICD-10-CM

## 2021-12-19 NOTE — Telephone Encounter (Signed)
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 She states they are requiring a clearance letter and a copy of patient's most recent echo. She states she will find out whether or not he needs an updated echo for this year. Last echo on file was on 12/08/19. Please assist.

## 2021-12-20 NOTE — Telephone Encounter (Signed)
Spoke to patient wife per dpr. She reports he only needs a echo.

## 2021-12-24 DIAGNOSIS — N200 Calculus of kidney: Secondary | ICD-10-CM | POA: Diagnosis not present

## 2021-12-24 NOTE — Telephone Encounter (Signed)
Order placed for echo and sent to scheduling to be scheduled.

## 2021-12-25 ENCOUNTER — Ambulatory Visit (HOSPITAL_BASED_OUTPATIENT_CLINIC_OR_DEPARTMENT_OTHER)
Admission: RE | Admit: 2021-12-25 | Discharge: 2021-12-25 | Disposition: A | Discharge status: Home / Self Care | Payer: Medicare Other | Source: Ambulatory Visit | Attending: Cardiology | Admitting: Cardiology

## 2021-12-25 ENCOUNTER — Other Ambulatory Visit: Payer: Self-pay

## 2021-12-25 DIAGNOSIS — R0609 Other forms of dyspnea: Secondary | ICD-10-CM | POA: Diagnosis not present

## 2021-12-25 LAB — ECHOCARDIOGRAM COMPLETE
AR max vel: 3.3 cm2
AV Area VTI: 3.03 cm2
AV Area mean vel: 3.23 cm2
AV Mean grad: 4 mmHg
AV Peak grad: 6.7 mmHg
Ao pk vel: 1.29 m/s
Area-P 1/2: 2.23 cm2
Calc EF: 54.4 %
S' Lateral: 4.3 cm
Single Plane A2C EF: 61.5 %
Single Plane A4C EF: 47.3 %

## 2021-12-26 ENCOUNTER — Telehealth: Payer: Self-pay

## 2021-12-26 NOTE — Telephone Encounter (Signed)
-----   Message from Park Liter, MD sent at 12/26/2021 12:51 PM EST ----- Echocardiogram showed preserved left ventricle ejection fraction, overall looks good

## 2021-12-27 ENCOUNTER — Other Ambulatory Visit: Payer: Medicare Other

## 2021-12-30 ENCOUNTER — Other Ambulatory Visit: Payer: Self-pay

## 2021-12-30 ENCOUNTER — Encounter: Payer: Self-pay | Admitting: Cardiology

## 2021-12-30 ENCOUNTER — Ambulatory Visit: Payer: Medicare Other | Admitting: Cardiology

## 2021-12-30 VITALS — BP 134/78 | HR 68 | Ht 68.0 in | Wt 207.0 lb

## 2021-12-30 DIAGNOSIS — R002 Palpitations: Secondary | ICD-10-CM | POA: Diagnosis not present

## 2021-12-30 DIAGNOSIS — E782 Mixed hyperlipidemia: Secondary | ICD-10-CM | POA: Diagnosis not present

## 2021-12-30 DIAGNOSIS — I2510 Atherosclerotic heart disease of native coronary artery without angina pectoris: Secondary | ICD-10-CM

## 2021-12-30 DIAGNOSIS — Z951 Presence of aortocoronary bypass graft: Secondary | ICD-10-CM | POA: Diagnosis not present

## 2021-12-30 NOTE — Progress Notes (Signed)
Cardiology Office Note:    Date:  12/30/2021   ID:  Todd Buck, DOB 10-30-1956, MRN [id number]  PCP:  Shirline Frees, MD  Cardiologist:  Jenne Campus, MD    Referring MD: Shirline Frees, MD   Chief Complaint  Patient presents with   Medication Refill    On all cardiac meds    CDL license renewal.    Needing letter to renew license    History of Present Illness:    Todd Buck is a 66 y.o. male  with past medical significant for coronary artery bypass graft.  In March 2019 he required coronary bypass graft with LIMA to LAD SVG to ramus and SVG to diagonal branch and SVG to RCA.  His ejection fraction was normal.  He also got dyslipidemia.  He comes today 2 months of follow-up overall he is doing very well.  He denies any chest pain tightness squeezing pressure burning chest interestingly at the end of December he ended up waking up in the middle of the night being very scared he noticed his palpitations he eventually ended up going to the emergency room to tell me today that he did not feel bad but because he does have insurance he set up that at checkout.  Everything was negative he was discharged home since that time he is doing well.  Denies have any chest pain tightness squeezing pressure burning chest.  He went hunting in November in the [place] and had no difficulty walking and carrying heavy stuff.  Past Medical History:  Diagnosis Date   CAD (coronary artery disease), native coronary artery 02/16/2018   Cath 02/16/18    99% prox RCA, PDA 100% with collaterals, Ramus 60 and 80% prox, Prox LAD 95%, mid 70%, mid 100% with collaterals, ostial diagonal 90%, ostial circ 99%  CABG 02/18/18 Dr. 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 LIMA TO LAD, SVG TO DISTAL RCA, SVG TO DIAGONAL, SVG TO INTERMEDIUS;  Surgeon: Grace Isaac, MD;  Location: [facility];  Service: Open Heart Surgery;  Laterality: N/A;   dental implants     EXTRACORPOREAL SHOCK WAVE LITHOTRIPSY Right 05/30/2019   Procedure: EXTRACORPOREAL SHOCK WAVE LITHOTRIPSY (ESWL);  Surgeon: Ardis Hughs, MD;  Location[facility];  Service: Urology;  Laterality: Right;   LASIK     EYES   LEFT HEART CATH AND CORONARY ANGIOGRAPHY N/A 02/16/2018   Procedure: LEFT HEART CATH AND CORONARY ANGIOGRAPHY;  Surgeon: Leonie Man, MD;  Location: [facility];  Service: Cardiovascular;  Laterality: N/A;   LITHOTRIPSY     POLYPECTOMY     TEE WITHOUT CARDIOVERSION N/A 02/17/2018   Procedure: TRANSESOPHAGEAL  ECHOCARDIOGRAM (TEE);  Surgeon: Grace Isaac, MD;  Location: [facility];  Service: Open Heart Surgery;  Laterality: N/A;    Current Medications: Current Meds  Medication Sig   aspirin EC 81 MG tablet Take 81 mg by mouth daily. Swallow whole.   atorvastatin (LIPITOR) 80 MG tablet Take 1 tablet (80 mg total) by mouth daily.   ezetimibe (ZETIA) 10 MG tablet TAKE 1 TABLET(10 MG) BY MOUTH DAILY   ezetimibe (ZETIA) 10 MG tablet Take 10 mg by mouth daily.   metoprolol succinate (TOPROL-XL) 50 MG 24 hr tablet TAKE 1 TABLET BY MOUTH DAILY WITH OR IMMEDIATELY FOLLOWING A MEAL     Allergies:   Patient has no known allergies.   Social History    Socioeconomic History   Marital status: Married    Spouse name: Not on file   Number of children: Not on file   Years of education: Not on file   Highest education level: Not on file  Occupational History   Not on file  Tobacco Use   Smoking status: Former   Smokeless tobacco: Never   Tobacco comments:    40 years ago  Vaping Use   Vaping Use: Never used  Substance and Sexual Activity   Alcohol use: No   Drug use: No   Sexual activity: Yes  Other Topics Concern   Not on file  Social History Narrative   Not on file   Social Determinants of Health   Financial Resource Strain: Not on file  Food Insecurity: Not on file  Transportation Needs: Not on file  Physical Activity: Not on file  Stress: Not on file  Social Connections: Not on file     Family History: The patient's family history includes Heart disease in his father and mother; Hyperlipidemia in his sister and sister; Renal Disease in his sister. There is no history of Colon cancer. ROS:   Please see the history of present illness.    All 14 point review of systems negative except as described per history of present illness  EKGs/Labs/Other Studies Reviewed:      Recent Labs: 12/10/2021: ALT 29; BUN 23; Creatinine, Ser 1.50; Hemoglobin 13.8; Platelets 168; Potassium 3.1; Sodium 138  Recent Lipid Panel    Component Value Date/Time   CHOL 108 06/22/2020 0833   TRIG 91 06/22/2020 0833   HDL 40 06/22/2020 0833   CHOLHDL 2.7 06/22/2020 0833   CHOLHDL 5.2 02/15/2018 1646   VLDL 50 (H) 02/15/2018 1646   LDLCALC 50 06/22/2020 0833    Physical Exam:    VS:  BP 134/78 (BP Location: Right Arm, Patient Position: Sitting)    Pulse 68    Ht 5\' 8"  (1.727 m)    Wt 207 lb (93.9 kg)    SpO2 97%    BMI 31.47 kg/m     Wt Readings from Last 3 Encounters:  12/30/21 207 lb (93.9 kg)  12/10/21 200 lb (90.7 kg)  09/19/21 206 lb (93.4 kg)     GEN:  Well nourished, well developed in no acute distress HEENT:  Normal NECK: No JVD; No carotid bruits LYMPHATICS: No lymphadenopathy CARDIAC: RRR, no murmurs, no rubs, no gallops RESPIRATORY:  Clear to auscultation without rales, wheezing or rhonchi  ABDOMEN: Soft, non-tender, non-distended MUSCULOSKELETAL:  No edema; No deformity  SKIN: Warm and dry LOWER EXTREMITIES: no swelling NEUROLOGIC:  Alert and oriented x 3 PSYCHIATRIC:  Normal affect   ASSESSMENT:    1. Coronary artery disease involving native coronary  artery of native heart without angina pectoris   2. S/P CABG x 4   3. Mixed hyperlipidemia   4. Palpitations    PLAN:    In order of problems listed above:  Coronary disease stable from that point review asymptomatic stress test done in December showed no evidence of ischemia. Status post coronary artery bypass graft stable, will continue aggressive management. Palpitations denies having any since that episode in December I told him that probably he can benefit from monitor however he does not want to wear it he said he is simply cannot wait and let me know if this reoccurs again Dyslipidemia I did review K PN which show LDL of 48 HDL of 44   Medication Adjustments/Labs and Tests Ordered: Current medicines are reviewed at length with the patient today.  Concerns regarding medicines are outlined above.  No orders of the defined types were placed in this encounter.  Medication changes: No orders of the defined types were placed in this encounter.   Signed, Park Liter, MD, [facility] 12/30/2021 1:44 PM    [facility]

## 2021-12-30 NOTE — Patient Instructions (Signed)

## 2022-02-05 ENCOUNTER — Ambulatory Visit: Payer: Medicare Other | Admitting: Cardiology

## 2022-02-20 DIAGNOSIS — E7800 Pure hypercholesterolemia, unspecified: Secondary | ICD-10-CM | POA: Diagnosis not present

## 2022-02-20 DIAGNOSIS — N183 Chronic kidney disease, stage 3 unspecified: Secondary | ICD-10-CM | POA: Diagnosis not present

## 2022-02-20 DIAGNOSIS — J329 Chronic sinusitis, unspecified: Secondary | ICD-10-CM | POA: Diagnosis not present

## 2022-02-20 DIAGNOSIS — I2510 Atherosclerotic heart disease of native coronary artery without angina pectoris: Secondary | ICD-10-CM | POA: Diagnosis not present

## 2022-04-01 ENCOUNTER — Other Ambulatory Visit: Payer: Self-pay | Admitting: Cardiology

## 2022-04-21 DIAGNOSIS — H524 Presbyopia: Secondary | ICD-10-CM | POA: Diagnosis not present

## 2022-04-29 DIAGNOSIS — M8648 Chronic osteomyelitis with draining sinus, other site: Secondary | ICD-10-CM | POA: Insufficient documentation

## 2022-04-30 DIAGNOSIS — J329 Chronic sinusitis, unspecified: Secondary | ICD-10-CM | POA: Diagnosis not present

## 2022-04-30 DIAGNOSIS — J342 Deviated nasal septum: Secondary | ICD-10-CM | POA: Diagnosis not present

## 2022-04-30 DIAGNOSIS — J341 Cyst and mucocele of nose and nasal sinus: Secondary | ICD-10-CM | POA: Diagnosis not present

## 2022-04-30 DIAGNOSIS — J3489 Other specified disorders of nose and nasal sinuses: Secondary | ICD-10-CM | POA: Diagnosis not present

## 2022-04-30 DIAGNOSIS — J324 Chronic pansinusitis: Secondary | ICD-10-CM | POA: Diagnosis not present

## 2022-05-30 DIAGNOSIS — M8648 Chronic osteomyelitis with draining sinus, other site: Secondary | ICD-10-CM | POA: Diagnosis not present

## 2022-05-30 DIAGNOSIS — J329 Chronic sinusitis, unspecified: Secondary | ICD-10-CM | POA: Diagnosis not present

## 2022-05-30 DIAGNOSIS — J342 Deviated nasal septum: Secondary | ICD-10-CM | POA: Diagnosis not present

## 2022-06-19 DIAGNOSIS — N183 Chronic kidney disease, stage 3 unspecified: Secondary | ICD-10-CM | POA: Diagnosis not present

## 2022-06-19 DIAGNOSIS — N410 Acute prostatitis: Secondary | ICD-10-CM | POA: Diagnosis not present

## 2022-06-25 ENCOUNTER — Encounter: Payer: Self-pay | Admitting: Gastroenterology

## 2022-07-28 DIAGNOSIS — N200 Calculus of kidney: Secondary | ICD-10-CM | POA: Diagnosis not present

## 2022-07-29 ENCOUNTER — Ambulatory Visit (AMBULATORY_SURGERY_CENTER): Payer: Self-pay | Admitting: *Deleted

## 2022-07-29 VITALS — Ht 68.0 in | Wt 209.6 lb

## 2022-07-29 DIAGNOSIS — Z86010 Personal history of colonic polyps: Secondary | ICD-10-CM

## 2022-07-29 MED ORDER — NA SULFATE-K SULFATE-MG SULF 17.5-3.13-1.6 GM/177ML PO SOLN: 1.0000 | Freq: Once | ORAL | 0 refills | Status: AC

## 2022-07-29 NOTE — Progress Notes (Signed)
No egg or soy allergy known to patient  No issues known to pt with past sedation with any surgeries or procedures Patient denies ever being told they had issues or difficulty with intubation  No FH of Malignant Hyperthermia Pt is not on diet pills Pt is not on home 02  Pt is not on blood thinners  Pt denies issues with constipation  No A fib or A flutter Have any cardiac testing pending--NO Pt instructed to use Singlecare.com or GoodRx for a price reduction on prep   

## 2022-08-11 ENCOUNTER — Encounter: Payer: Self-pay | Admitting: Gastroenterology

## 2022-08-15 IMAGING — US US RENAL
1 series · 14 of 25 positions shown · non-contrast
Comparison: CT abdomen pelvis 05/17/2019

CLINICAL DATA: Left flank pain

EXAM:
RENAL / URINARY TRACT ULTRASOUND COMPLETE

[Series 1: us renal · 14 of 46 slices shown]
[im 1/46]
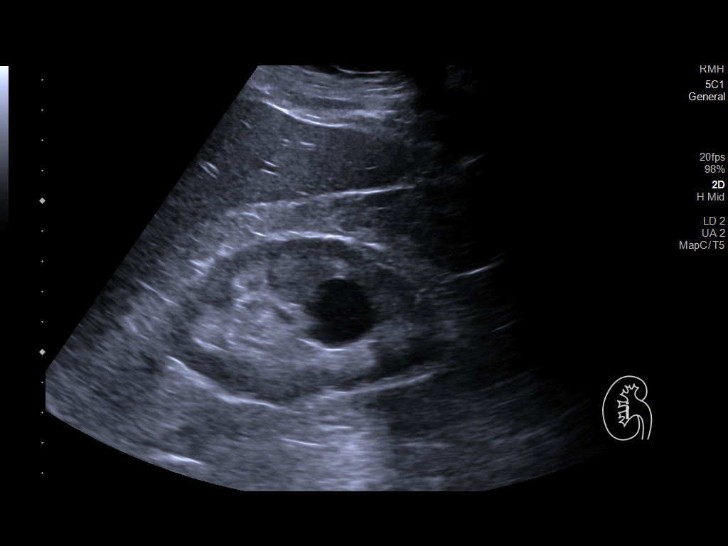
[im 4/46]
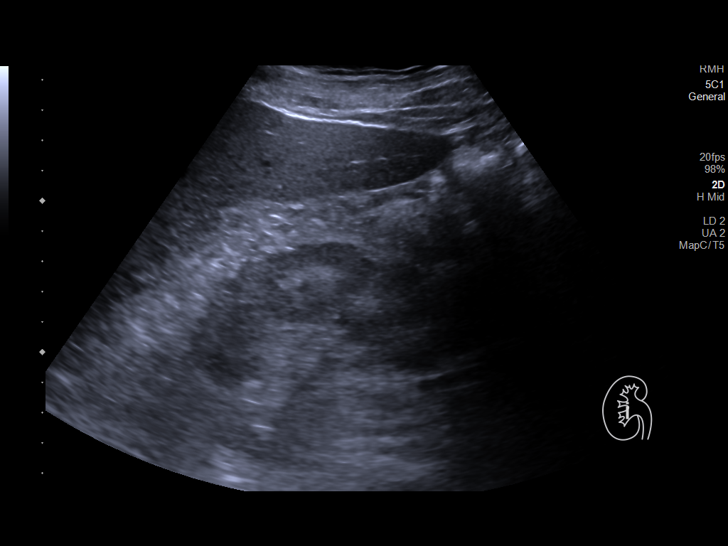
[im 8/46]
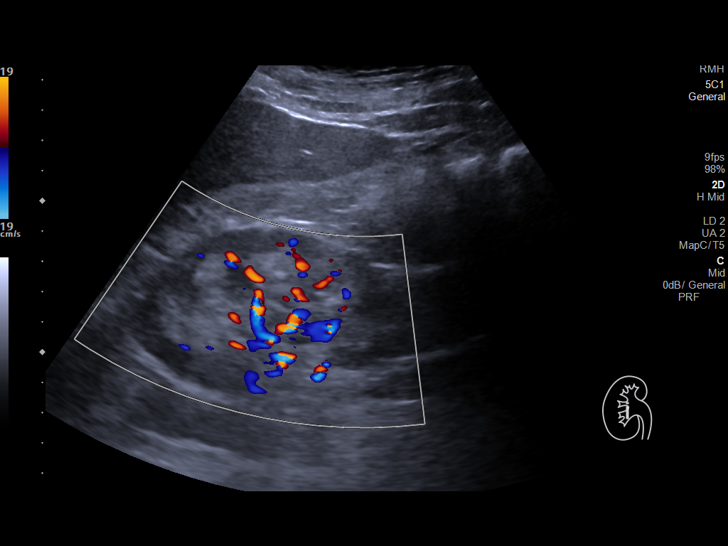
[im 12/46]
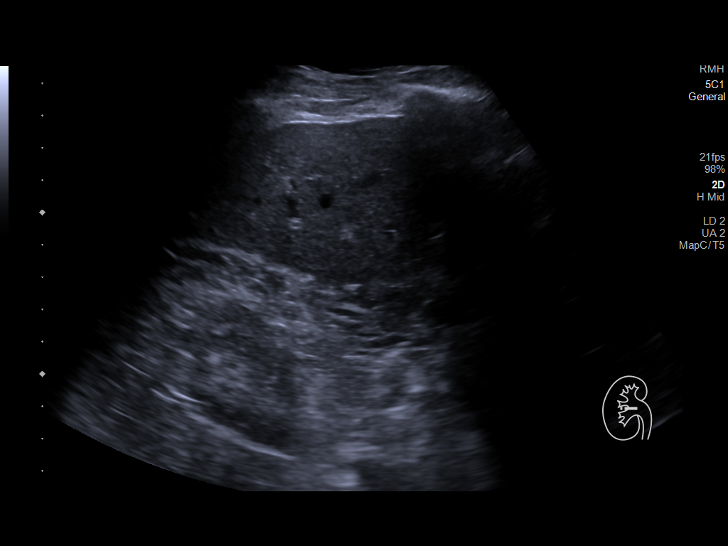
[im 16/46]
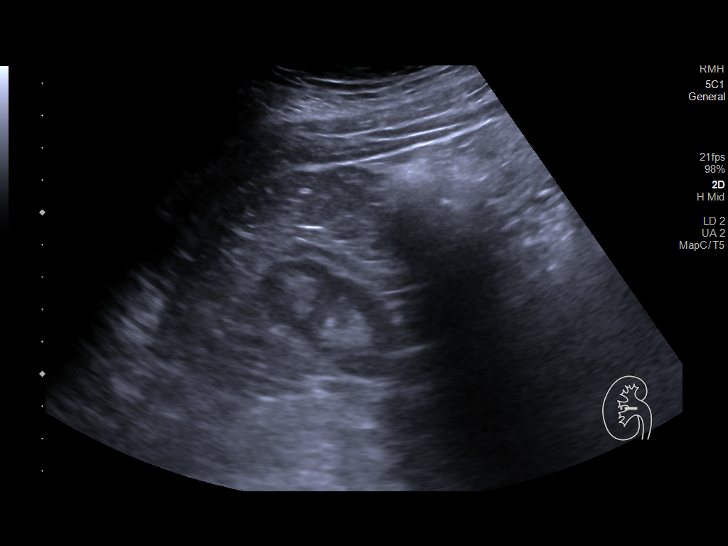
[im 17/46]
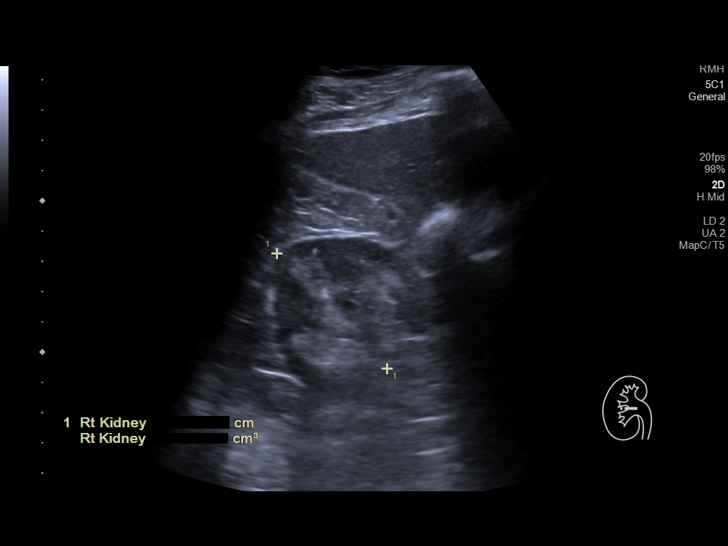
[im 21/46]
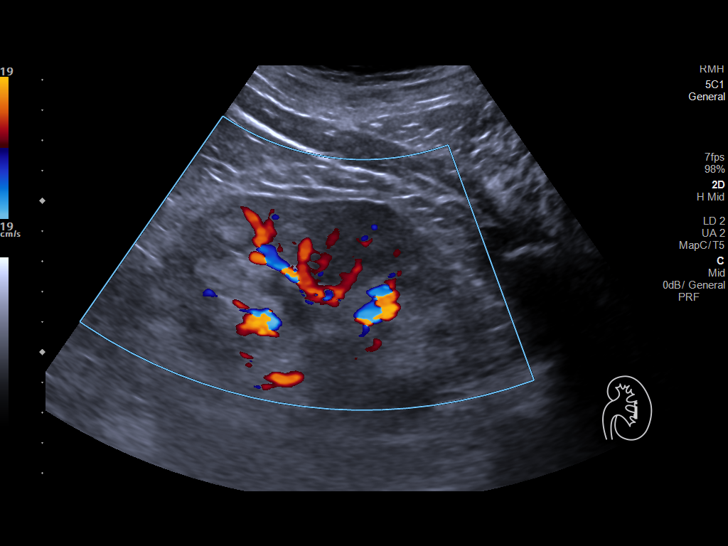
[im 25/46]
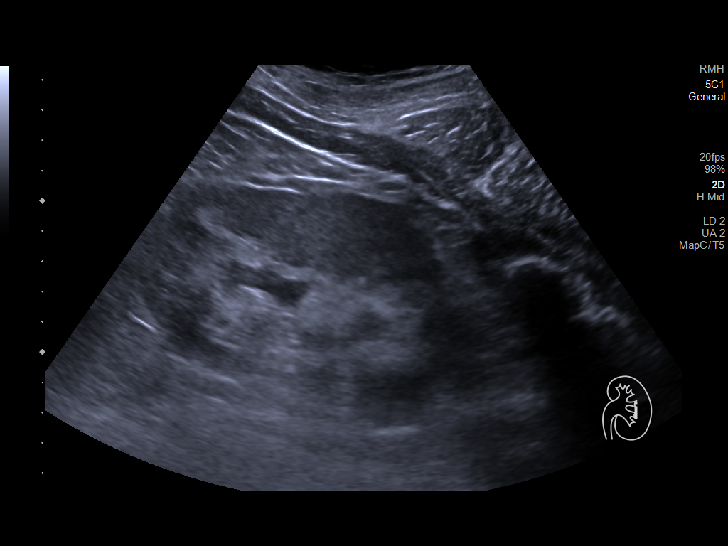
[im 29/46]
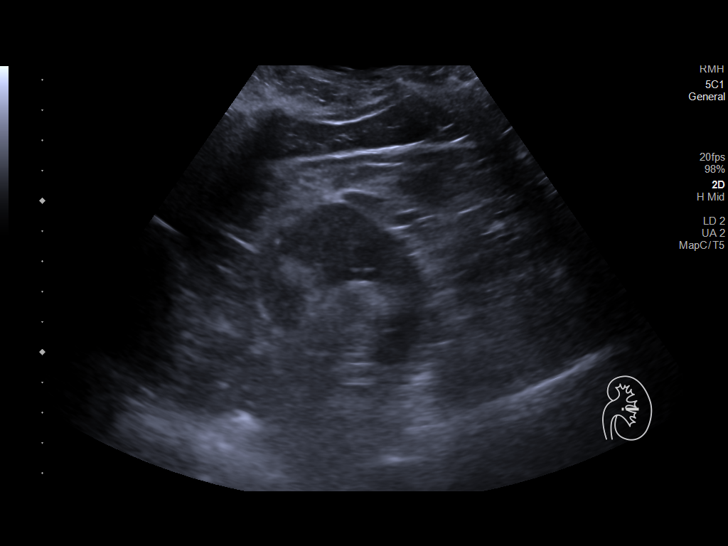
[im 31/46]
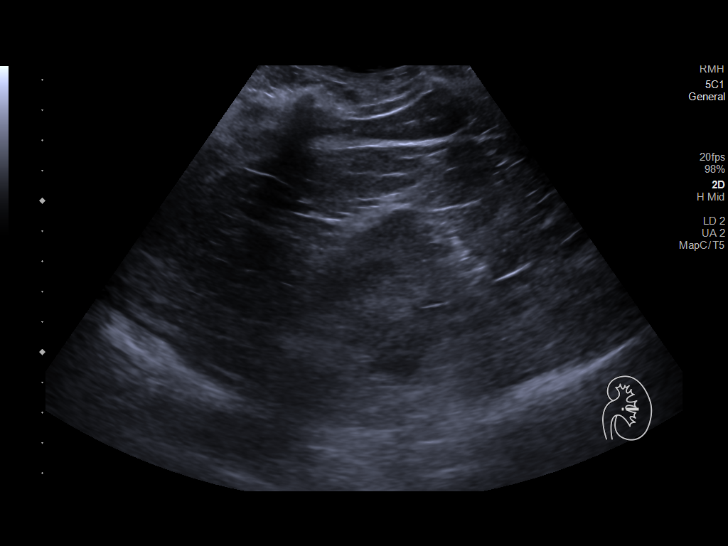
[im 34/46]
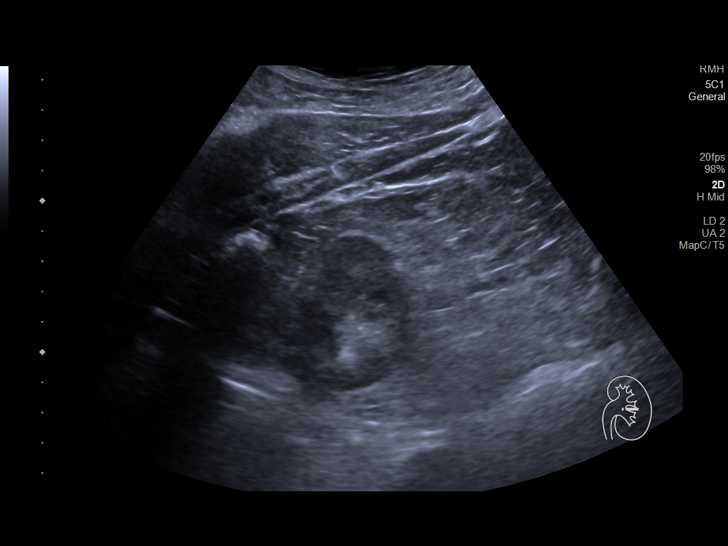
[im 38/46]
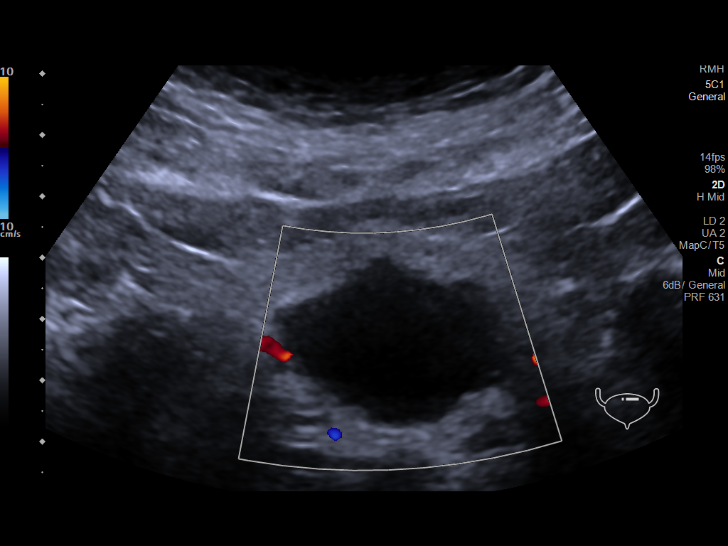
[im 42/46]
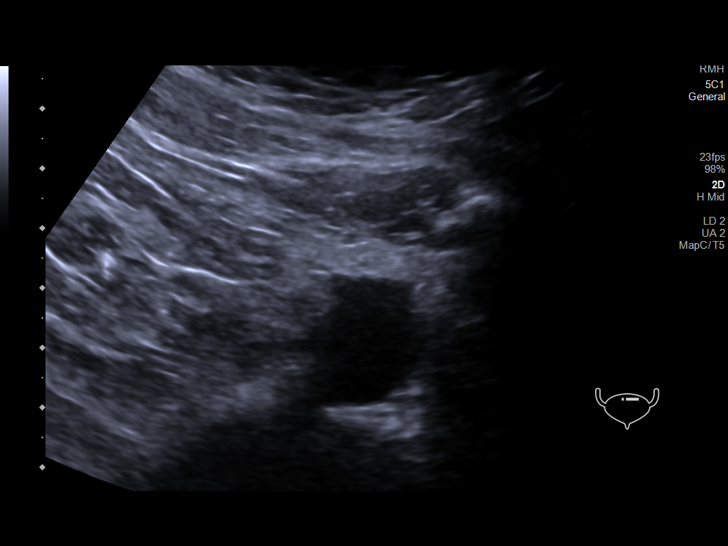
[im 46/46]
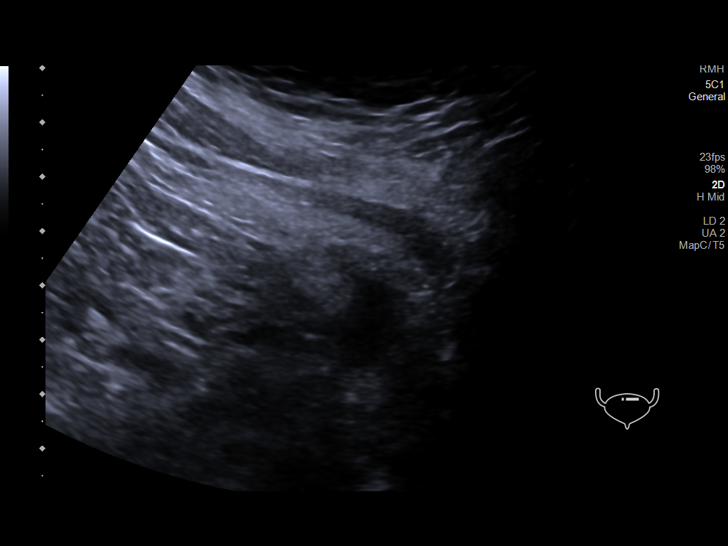

[14 of 25 positions shown; findings below may reference images not displayed]

FINDINGS: Right Kidney:

Renal measurements: 9.8 x 5.5 x 5.3 cm = volume: 149 mL. 2.2 cm left
renal cyst. Echogenicity within normal limits. No mass or
hydronephrosis visualized.

Left Kidney:

Renal measurements: 11.1 x 6.0 x 4.8 cm = volume: 168 mL. Mild left
hydronephrosis. No calculus identified.

Bladder:

Small amount of urine in the bladder without focal abnormality.

Other:

None.
IMPRESSION: Mild left hydronephrosis which could be due to distal obstruction.

2.2 cm right renal calculus.

## 2022-08-15 IMAGING — DX DG ABDOMEN 1V
1 series · 1 of 1 positions shown · non-contrast
Comparison: None.

CLINICAL DATA: Left flank pain, history of kidney stones.

EXAM:
ABDOMEN - 1 VIEW

[abdomen kub]
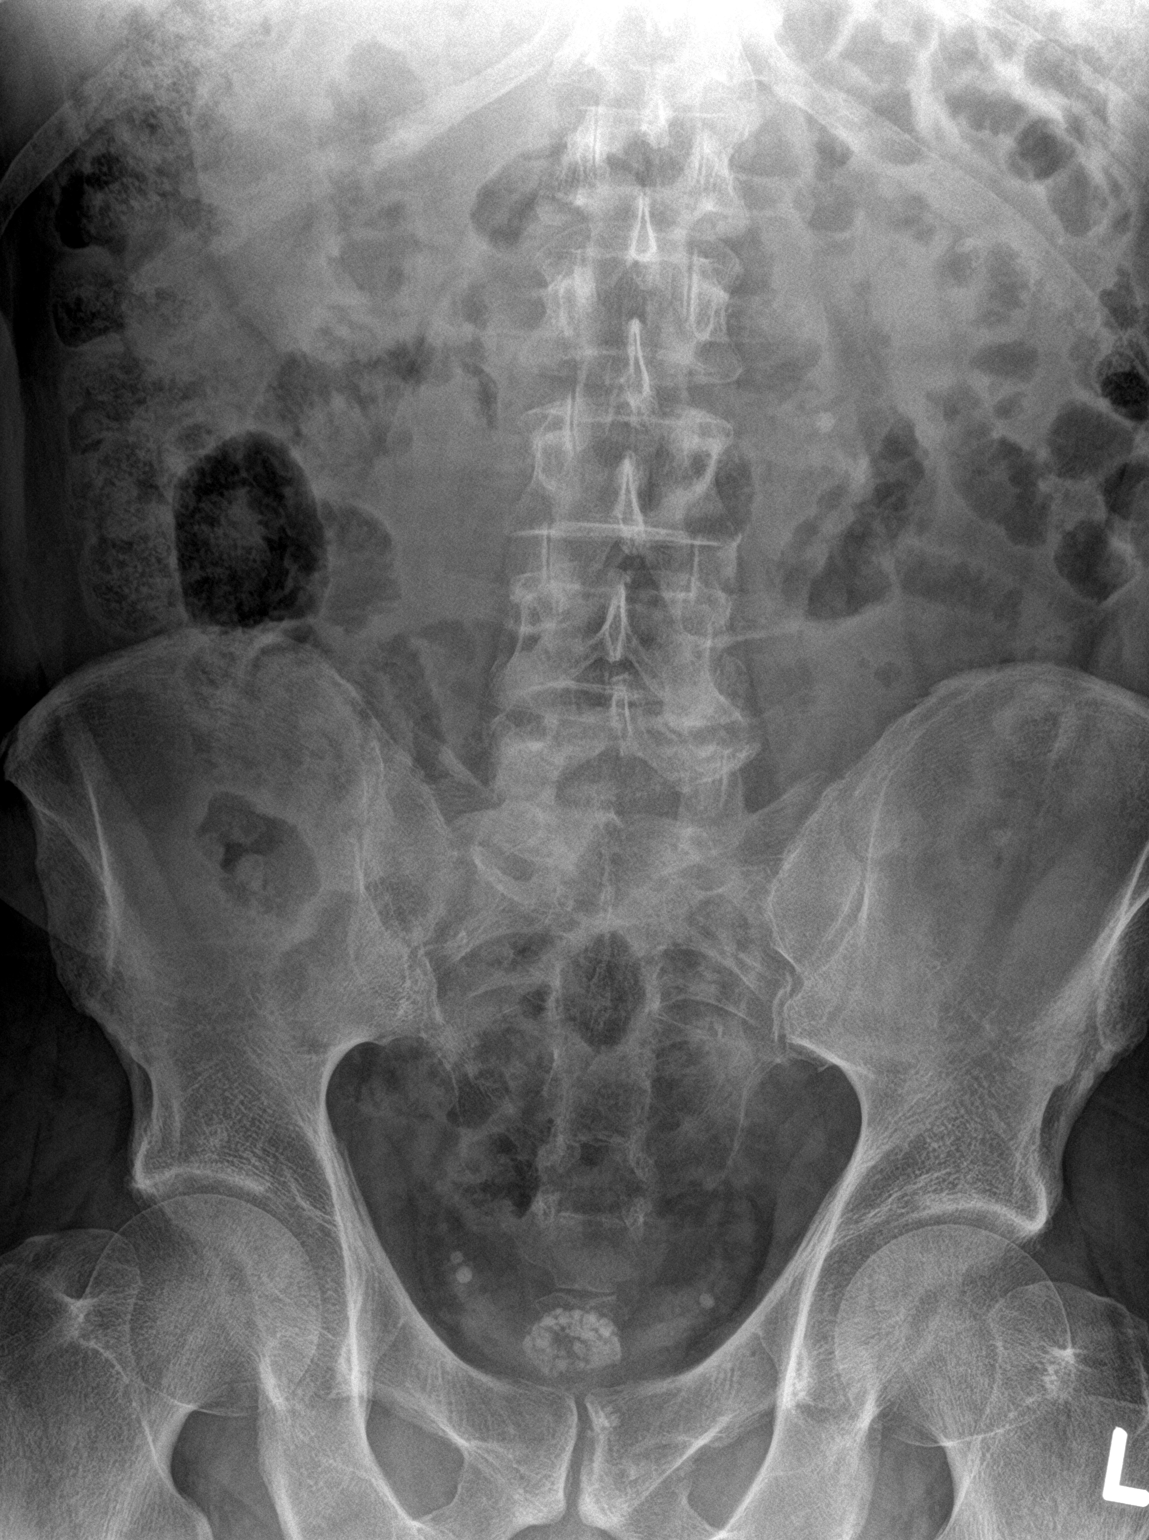

[1 of 1 positions shown; findings below may reference images not displayed]

FINDINGS: Nonobstructive bowel gas pattern. There is a 5 mm calculus overlying
the left kidney. There is a 5 mm calcification overlying the left
psoas/course of the left ureter. Unchanged pelvic phleboliths. No
acute osseous abnormality.
IMPRESSION: 5 mm calcification overlying the course of the left proximal ureter,
possibly ureterolithiasis.

Additional 5 mm calculus overlying the left kidney.

## 2022-08-20 DIAGNOSIS — E7800 Pure hypercholesterolemia, unspecified: Secondary | ICD-10-CM | POA: Diagnosis not present

## 2022-08-20 DIAGNOSIS — N183 Chronic kidney disease, stage 3 unspecified: Secondary | ICD-10-CM | POA: Diagnosis not present

## 2022-08-20 DIAGNOSIS — Z125 Encounter for screening for malignant neoplasm of prostate: Secondary | ICD-10-CM | POA: Diagnosis not present

## 2022-08-20 DIAGNOSIS — I2510 Atherosclerotic heart disease of native coronary artery without angina pectoris: Secondary | ICD-10-CM | POA: Diagnosis not present

## 2022-08-20 DIAGNOSIS — Z0000 Encounter for general adult medical examination without abnormal findings: Secondary | ICD-10-CM | POA: Diagnosis not present

## 2022-08-26 ENCOUNTER — Encounter: Payer: Self-pay | Admitting: Gastroenterology

## 2022-08-26 ENCOUNTER — Ambulatory Visit (AMBULATORY_SURGERY_CENTER): Payer: Medicare Other | Admitting: Gastroenterology

## 2022-08-26 VITALS — BP 116/74 | HR 63 | Temp 98.4°F | Resp 15 | Ht 68.0 in | Wt 209.6 lb

## 2022-08-26 DIAGNOSIS — Z86010 Personal history of colonic polyps: Secondary | ICD-10-CM | POA: Diagnosis not present

## 2022-08-26 DIAGNOSIS — Z09 Encounter for follow-up examination after completed treatment for conditions other than malignant neoplasm: Secondary | ICD-10-CM

## 2022-08-26 MED ORDER — SODIUM CHLORIDE 0.9 % IV SOLN: 500.0000 mL | INTRAVENOUS | Status: DC

## 2022-08-26 NOTE — Progress Notes (Signed)
A and O x3. Report to RN. Tolerated MAC anesthesia well. 

## 2022-08-26 NOTE — Progress Notes (Signed)
History and Physical:  This patient presents for endoscopic testing for: Encounter Diagnosis  Name Primary?   Personal history of colonic polyps Yes    Surveillance for prior polyps. 28m TA March 2018 716mTA in 2012 Recent episode of pelvic pain, treated empirically for prostatitis No change in bowel habits aside from self-limited constipation while on Abx No rectal bleeding  Patient is otherwise without complaints or active issues today.   Past Medical History: Past Medical History:  Diagnosis Date   CAD (coronary artery disease), native coronary artery 02/16/2018   Cath 02/16/18    99% prox RCA, PDA 100% with collaterals, Ramus 60 and 80% prox, Prox LAD 95%, mid 70%, mid 100% with collaterals, ostial diagonal 90%, ostial circ 99%  CABG 02/18/18 Dr. 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 LIMA TO LAD, SVG TO DISTAL RCA, SVG TO DIAGONAL, SVG TO INTERMEDIUS;  Surgeon: GeGrace IsaacMD;  [facility]: [facility];  Laterality: N/A;   dental implants     EXTRACORPOREAL SHOCK WAVE LITHOTRIPSY Right 05/30/2019   Procedure: EXTRACORPOREAL SHOCK WAVE LITHOTRIPSY (ESWL);  Surgeon: HeArdis HughsMD;  [facility];  Service: Urology;  Laterality: Right;   LASIK     EYES   LEFT HEART CATH AND CORONARY ANGIOGRAPHY N/A 02/16/2018   Procedure: LEFT HEART CATH AND CORONARY ANGIOGRAPHY;  Surgeon: HaLeonie ManMD;  [facility];  Service: Cardiovascular;  Laterality: N/A;   LITHOTRIPSY     POLYPECTOMY     TEE WITHOUT CARDIOVERSION N/A 02/17/2018   Procedure: TRANSESOPHAGEAL ECHOCARDIOGRAM (TEE);  Surgeon: GeGrace IsaacMD;  [place] Service: Open [facility] Surgery;  Laterality: N/A;    Allergies: No Known Allergies  Outpatient Meds: Current Outpatient Medications  Medication Sig Dispense Refill   aspirin EC 81 MG tablet Take 81 mg by mouth daily. Swallow whole.     atorvastatin (LIPITOR) 80 MG tablet Take 1 tablet (80 mg total) by mouth daily. 90 tablet 3   ezetimibe (ZETIA) 10 MG tablet TAKE 1 TABLET(10 MG) BY MOUTH DAILY 90 tablet 3   metoprolol succinate (TOPROL-XL) 50 MG 24 hr tablet Take 1 tablet (50 mg total) by mouth daily. 90 tablet 2   meloxicam (MOBIC) 15 MG tablet Take 15 mg by mouth daily.     tamsulosin (FLOMAX) 0.4 MG CAPS capsule Take 0.4 mg by mouth daily.     Current Facility-Administered Medications  Medication Dose Route Frequency Provider Last Rate Last Admin   0.9 %  sodium chloride infusion  500 mL Intravenous Continuous Danis, HeKirke CorinMD  ___________________________________________________________________ Objective   Exam:  BP 118/88   Pulse 78   Temp 98.4 F (36.9 C) (Temporal)   Ht '5\' 8"'$  (1.727 m)   Wt 209 lb 9.6 oz (95.1 kg)   SpO2 97%   BMI 31.87 kg/m   CV: RRR without murmur, S1/S2 Resp: clear to auscultation bilaterally, normal RR and effort noted GI: soft, no tenderness, with active bowel sounds.   Assessment: Encounter Diagnosis  Name Primary?   Personal history of colonic polyps Yes     Plan: Colonoscopy  The benefits and risks of the planned procedure were described  in detail with the patient or (when appropriate) their health care proxy.  Risks were outlined as including, but not limited to, bleeding, infection, perforation, adverse medication reaction leading to cardiac or pulmonary decompensation, pancreatitis (if ERCP).  The limitation of incomplete mucosal visualization was also discussed.  No guarantees or warranties were given.    The patient is appropriate for an endoscopic procedure in the ambulatory setting.   - Wilfrid Lund, MD

## 2022-08-26 NOTE — Patient Instructions (Addendum)
Resume previous diet and continue present medications. Repeat colonoscopy for surveillance will be in 10 years! 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  If the procedure report does not answer your questions, please call your gastroenterologist to clarify.  If you requested that your care partner not be given the details of your procedure findings, then the procedure report has been included in a sealed envelope for you to review at your convenience later.  YOU SHOULD EXPECT: Some feelings of bloating in the abdomen. Passage of more gas than usual.  Walking can help get rid of the air that was put into your GI tract during the procedure and reduce the bloating. If you had a lower endoscopy (such as a colonoscopy or flexible sigmoidoscopy) you may notice spotting of blood in your stool or on the toilet paper. If you underwent a bowel prep for your procedure, you may not have a normal bowel movement for a few days.  Please Note:  You might notice some irritation and congestion in your nose or some drainage.  This is from the oxygen used during your procedure.  There is no need for concern and it should clear up in a day or so.  SYMPTOMS TO REPORT IMMEDIATELY:  Following lower endoscopy (colonoscopy or flexible sigmoidoscopy):  Excessive amounts of blood in the stool  Significant tenderness or worsening of abdominal pains  Swelling of the abdomen that is new, acute  Fever of 100F or higher  For urgent or emergent issues, a gastroenterologist can be reached at any hour by calling [phone]. Do not use MyChart messaging for urgent concerns.    DIET:  We do  recommend a small meal at first, but then you may proceed to your regular diet.  Drink plenty of fluids but you should avoid alcoholic beverages for 24 hours.  ACTIVITY:  You should plan to take it easy for the rest of today and you should NOT DRIVE or use heavy machinery until tomorrow (because of the sedation medicines used during the test).    FOLLOW UP: Our staff will call the number listed on your records the next business day following your procedure.  We will call around 7:15- 8:00 am to check on you and address any questions or concerns that you may have regarding the information given to you following your procedure. If we do not reach you, we will leave a message.     If any biopsies were taken you will be contacted by phone or by letter within the next 1-3 weeks.  Please call us at [phone] if you have not heard about the biopsies in 3 weeks.    SIGNATURES/CONFIDENTIALITY: You and/or your care partner have signed paperwork which will be entered into your electronic medical record.  These signatures attest to the fact that that the information above on your After Visit Summary has been reviewed and is understood.  Full responsibility of the confidentiality of this discharge information lies with you and/or your care-partner.

## 2022-08-26 NOTE — Op Note (Signed)
[place] Patient Name: Todd Buck Procedure Date: 08/26/2022 8:21 AM MRN: [id number] Endoscopist: [place]. Loletha Carrow , MD Age: 66 Referring MD:  Date of Birth: Jun 04, 1956 Gender: Male Account #: [id number] Procedure:                Colonoscopy Indications:              Surveillance: Personal history of adenomatous                            polyps on last colonoscopy > 5 years ago                           <24m TA March 2018; <183mTA 2012                           Patient incidentally reports recent pelvic pain for                            which he saw urology and had Abx twice (? empiric                            for prostatitis)., Self-limied constipation while                            on Abx, no rectal bleeding. Lately subsided after                            PCP recommended decreasing caffeine intake Medicines:                Monitored Anesthesia Care Procedure:                Pre-Anesthesia Assessment:                           - Prior to the procedure, a History and Physical                            was performed, and patient medications and                            allergies were reviewed. The patient's tolerance of                            previous anesthesia was also reviewed. The risks                            and benefits of the procedure and the sedation                            options and risks were discussed with the patient.                            All questions were answered, and informed consent  was obtained. Prior Anticoagulants: The patient has                            taken no previous anticoagulant or antiplatelet                            agents. ASA Grade Assessment: II - A patient with                            mild systemic disease. After reviewing the risks                            and benefits, the patient was deemed in                            satisfactory condition to undergo the  procedure.                           After obtaining informed consent, the colonoscope                            was passed under direct vision. Throughout the                            procedure, the patient's blood pressure, pulse, and                            oxygen saturations were monitored continuously. The                            Colonoscope was introduced through the anus and                            advanced to the the cecum, identified by                            appendiceal orifice and ileocecal valve. The                            colonoscopy was performed without difficulty. The                            patient tolerated the procedure well. The quality                            of the bowel preparation was good. The ileocecal                            valve, appendiceal orifice, and rectum were                            photographed. The bowel preparation used was SUPREP. Scope In: 8:28:29 AM Scope Out: 8:41:15 AM Scope Withdrawal Time: 0 hours 9 minutes 34 seconds  Total  Procedure Duration: 0 hours 12 minutes 46 seconds  Findings:                 The perianal and digital rectal examinations were                            normal except for a small skin tag. No fissure.                           Repeat examination of right colon under NBI                            performed.                           The entire examined colon appeared normal on direct                            and retroflexion views. Complications:            No immediate complications. Estimated Blood Loss:     Estimated blood loss: none. Estimated blood loss:                            none. Impression:               - The entire examined colon is normal on direct and                            retroflexion views.                           - No specimens collected. Recommendation:           - Patient has a contact number available for                            emergencies. The signs  and symptoms of potential                            delayed complications were discussed with the                            patient. Return to normal activities tomorrow.                            Written discharge instructions were provided to the                            patient.                           - Resume previous diet.                           - Continue present medications.                           -  Repeat colonoscopy in 10 years for screening                            purposes.                           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 Sharesa Kemp L. Loletha Carrow, MD 08/26/2022 8:50:38 AM This report has been signed electronically.

## 2022-08-26 NOTE — Progress Notes (Signed)
Pt's states no medical or surgical changes since previsit or office visit. 

## 2022-08-27 ENCOUNTER — Telehealth: Payer: Self-pay

## 2022-08-27 NOTE — Telephone Encounter (Signed)
  Follow up Call-     08/26/2022    7:47 AM  Call back number  Post procedure Call Back phone  # [phone]  Permission to leave phone message Yes     Patient questions:  Do you have a fever, pain , or abdominal swelling? No. Pain Score  0 *  Have you tolerated food without any problems? Yes.    Have you been able to return to your normal activities? Yes.    Do you have any questions about your discharge instructions: Diet   No. Medications  No. Follow up visit  No.  Do you have questions or concerns about your Care? No.  Actions: * If pain score is 4 or above: No action needed, pain <4.  Spoke with patient's wife Ivin Booty.

## 2022-09-16 ENCOUNTER — Other Ambulatory Visit: Payer: Self-pay

## 2022-09-17 ENCOUNTER — Encounter: Payer: Self-pay | Admitting: Cardiology

## 2022-09-17 ENCOUNTER — Ambulatory Visit: Payer: Medicare Other | Attending: Cardiology | Admitting: Cardiology

## 2022-09-17 VITALS — BP 138/78 | HR 60 | Ht 68.0 in | Wt 209.0 lb

## 2022-09-17 DIAGNOSIS — E782 Mixed hyperlipidemia: Secondary | ICD-10-CM

## 2022-09-17 DIAGNOSIS — I2510 Atherosclerotic heart disease of native coronary artery without angina pectoris: Secondary | ICD-10-CM | POA: Diagnosis not present

## 2022-09-17 DIAGNOSIS — Z951 Presence of aortocoronary bypass graft: Secondary | ICD-10-CM

## 2022-09-17 NOTE — Addendum Note (Signed)
Addended by: Jacobo Forest D on: 09/17/2022 03:56 PM   Modules accepted: Orders

## 2022-09-17 NOTE — Patient Instructions (Addendum)
Medication Instructions:  Your physician recommends that you continue on your current medications as directed. Please refer to the Current Medication list given to you today.  *If you need a refill on your cardiac medications before your next appointment, please call your pharmacy*   Lab Work: None Ordered If you have labs (blood work) drawn today and your tests are completely normal, you will receive your results only by: [place] (if you have MyChart) OR A paper copy in the mail If you have any lab test that is abnormal or we need to change your treatment, we will call you to review the results.   Testing/Procedures: Your physician has requested that you have a lexiscan myoview. For further information please visit [email or web address]. Please follow instruction sheet, as given.  The test will take approximately 3 to 4 hours to complete; you may bring reading material.  If someone comes with you to your appointment, they will need to remain in the main lobby due to limited space in the testing area.   How to prepare for your Myocardial Perfusion Test: Do not eat or drink 3 hours prior to your test, except you may have water. Do not consume products containing caffeine (regular or decaffeinated) 12 hours prior to your test. (ex: coffee, chocolate, sodas, tea). Do bring a list of your current medications with you.  If not listed below, you may take your medications as normal. Do wear comfortable clothes (no dresses or overalls) and walking shoes, tennis shoes preferred (No heels or open toe shoes are allowed). Do NOT wear cologne, perfume, aftershave, or lotions (deodorant is allowed). If these instructions are not followed, your test will have to be rescheduled.     Follow-Up: At [facility], you and your health needs are our priority.  As part of our continuing mission to provide you with exceptional heart care, we have created designated Provider Care Teams.  These Care Teams  include your primary Cardiologist (physician) and Advanced Practice Providers (APPs -  Physician Assistants and Nurse Practitioners) who all work together to provide you with the care you need, when you need it.  We recommend signing up for the patient portal called "MyChart".  Sign up information is provided on this After Visit Summary.  MyChart is used to connect with patients for Virtual Visits (Telemedicine).  Patients are able to view lab/test results, encounter notes, upcoming appointments, etc.  Non-urgent messages can be sent to your provider as well.   To learn more about what you can do with MyChart, go to [email or web address].    Your next appointment:   12 month(s)  The format for your next appointment:   In Person  Provider:   Jenne Campus, MD    Other Instructions NA

## 2022-09-17 NOTE — Progress Notes (Signed)
Cardiology Office Note:    Date:  09/17/2022   ID:  Todd Buck, DOB 16-Apr-1956, MRN [id number]  PCP:  Shirline Frees, MD  Cardiologist:  Jenne Campus, MD    Referring MD: Shirline Frees, MD   Chief Complaint  Patient presents with   DOT physical    History of Present Illness:    Todd Buck is a 66 y.o. male   with past medical significant for coronary artery bypass graft.  In March 2019 he required coronary bypass graft with LIMA to LAD SVG to ramus and SVG to diagonal branch and SVG to RCA.  His ejection fraction was normal.  He also got dyslipidemia. He is coming today 2 months for follow-up.  Overall cardiac wise doing very well.  He denies of any chest pain tightness squeezing pressure burning chest can walk climb stairs with no problem he is trying to play pickle ball and he is enjoying it.  Past Medical History:  Diagnosis Date   CAD (coronary artery disease), native coronary artery 02/16/2018   Cath 02/16/18    99% prox RCA, PDA 100% with collaterals, Ramus 60 and 80% prox, Prox LAD 95%, mid 70%, mid 100% with collaterals, ostial diagonal 90%, ostial circ 99%  CABG 02/18/18 Dr. 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 LIMA TO LAD, SVG TO DISTAL RCA, SVG TO DIAGONAL, SVG TO INTERMEDIUS;  Surgeon: Grace Isaac, MD;  Location: [facility];  Service: Open Heart Surgery;  Laterality: N/A;   dental implants     EXTRACORPOREAL SHOCK WAVE LITHOTRIPSY Right 05/30/2019   Procedure: EXTRACORPOREAL SHOCK WAVE LITHOTRIPSY (ESWL);  Surgeon: Ardis Hughs, MD;  [facility];  Service: Urology;  Laterality: Right;   LASIK     EYES   LEFT HEART CATH AND CORONARY ANGIOGRAPHY N/A 02/16/2018   Procedure: LEFT HEART CATH AND CORONARY ANGIOGRAPHY;  Surgeon: Leonie Man, MD;  Location: [facility] [facility];  Service: Cardiovascular;  Laterality: N/A;   LITHOTRIPSY     POLYPECTOMY     TEE WITHOUT CARDIOVERSION N/A 02/17/2018   Procedure: TRANSESOPHAGEAL ECHOCARDIOGRAM (TEE);  Surgeon: Grace Isaac, MD;  Location: [facility];  Service: Open Heart Surgery;  Laterality: N/A;    Current Medications: Current Meds  Medication Sig   aspirin EC 81 MG tablet Take 81 mg by mouth daily. Swallow whole.   atorvastatin (LIPITOR) 80 MG tablet Take 1 tablet (80 mg total) by mouth daily.   ezetimibe (ZETIA) 10 MG tablet TAKE 1 TABLET(10 MG) BY MOUTH DAILY (Patient taking differently: Take 10 mg by mouth daily.)   metoprolol succinate (TOPROL-XL) 50 MG 24 hr tablet Take 1 tablet (50 mg total)  by mouth daily.     Allergies:   Patient has no known allergies.   Social History   Socioeconomic History   Marital status: Married    Spouse name: Not on file   Number of children: Not on file   Years of education: Not on file   Highest education level: Not on file  Occupational History   Not on file  Tobacco Use   Smoking status: Former    Passive exposure: Never   Smokeless tobacco: Never   Tobacco comments:    40 years ago  Vaping Use   Vaping Use: Never used  Substance and Sexual Activity   Alcohol use: No   Drug use: No   Sexual activity: Yes  Other Topics Concern   Not on file  Social History Narrative   Not on  file   Social Determinants of Health   Financial Resource Strain: Not on file  Food Insecurity: Not on file  Transportation Needs: Not on file  Physical Activity: Not on file  Stress: Not on file  Social Connections: Not on file     Family History: The patient's family history includes Heart disease in his father and mother; Hyperlipidemia in his sister and sister; Renal Disease in his sister. There is no history of Colon cancer, Esophageal cancer, Pancreatic cancer, Rectal cancer, or Stomach cancer. ROS:   Please see the history of present illness.    All 14 point review of systems negative except as described per history of present illness  EKGs/Labs/Other Studies Reviewed:      Recent Labs: 12/10/2021: ALT 29; BUN 23; Creatinine, Ser 1.50; Hemoglobin 13.8; Platelets 168; Potassium 3.1; Sodium 138  Recent Lipid Panel    Component Value Date/Time   CHOL 108 06/22/2020 0833   TRIG 91 06/22/2020 0833   HDL 40 06/22/2020 0833   CHOLHDL 2.7 06/22/2020 0833   CHOLHDL 5.2 02/15/2018 1646   VLDL 50 (H) 02/15/2018 1646   LDLCALC 50 06/22/2020 0833    Physical Exam:    VS:  BP 138/78 (BP Location: Left Arm, Patient Position: Sitting)   Pulse 60   Ht '5\' 8"'$  (1.727 m)   Wt 209 lb (94.8 kg)   SpO2 97%   BMI 31.78 kg/m     Wt Readings from Last 3 Encounters:  09/17/22 209 lb (94.8 kg)  08/26/22 209 lb 9.6 oz (95.1 kg)  07/29/22 209 lb 9.6 oz (95.1 kg)     GEN:  Well nourished, well developed in no acute distress HEENT: Normal NECK: No JVD; No carotid bruits LYMPHATICS: No lymphadenopathy CARDIAC: RRR, no murmurs, no rubs, no gallops RESPIRATORY:  Clear to auscultation without rales, wheezing or rhonchi  ABDOMEN: Soft, non-tender, non-distended MUSCULOSKELETAL:  No edema; No deformity  SKIN: Warm and dry LOWER EXTREMITIES: no swelling NEUROLOGIC:  Alert and oriented x 3 PSYCHIATRIC:  Normal affect   ASSESSMENT:    1. Coronary artery disease involving native  coronary artery of native heart without angina pectoris   2. S/P CABG x 4   3. Mixed hyperlipidemia    PLAN:    In order of problems listed above:  Coronary disease status post coronary bypass grafting 2019 completely asymptomatic doing very well continue present management.  He will require DOT physical.  From 1 my understanding he need to have a stress test done every 2 years.  Last time stress test done in May 2022.  We will probably end up doing another stress test just for DOT  physical. Mixed dyslipidemia I did review K PN which showed me his LDL 51 HDL 43 this is from September 2023.  He is on high intense statin for lipid as well as Zetia 10 which I will continue. GERD she denies having any. Chronic kidney failure.  Last creatinine I see is 1.6 this is from September 2023.  Stable.   Medication Adjustments/Labs and Tests Ordered: Current medicines are reviewed at length with the patient today.  Concerns regarding medicines are outlined above.  No orders of the defined types were placed in this encounter.  Medication changes: No orders of the defined types were placed in this encounter.   Signed, Park Liter, MD, [facility] 09/17/2022 3:48 PM    [place]

## 2022-09-18 ENCOUNTER — Telehealth (HOSPITAL_COMMUNITY): Payer: Self-pay | Admitting: *Deleted

## 2022-09-18 NOTE — Telephone Encounter (Signed)
Patient given detailed instructions per Myocardial Perfusion Study Information Sheet for the test on 09/22/2022 at 8:00. Patient notified to arrive 15 minutes early and that it is imperative to arrive on time for appointment to keep from having the test rescheduled.  If you need to cancel or reschedule your appointment, please call the office within 24 hours of your appointment. . Patient verbalized understanding.Todd Buck

## 2022-09-18 NOTE — Addendum Note (Signed)
Addended by: Jacobo Forest D on: 09/18/2022 09:05 AM   Modules accepted: Orders

## 2022-09-22 ENCOUNTER — Ambulatory Visit (HOSPITAL_COMMUNITY): Payer: Medicare Other | Attending: Cardiology

## 2022-09-22 DIAGNOSIS — I2510 Atherosclerotic heart disease of native coronary artery without angina pectoris: Secondary | ICD-10-CM | POA: Insufficient documentation

## 2022-09-22 LAB — MYOCARDIAL PERFUSION IMAGING
Estimated workload: 11.5
Exercise duration (min): 9 min
Exercise duration (sec): 0 s
LV dias vol: 104 mL (ref 62–150)
LV sys vol: 61 mL
MPHR: 154 {beats}/min
Nuc Stress EF: 41 %
Peak HR: 137 {beats}/min
Percent HR: 88 %
Rest HR: 68 {beats}/min
Rest Nuclear Isotope Dose: 11 mCi
SDS: 0
SRS: 2
SSS: 2
Stress Nuclear Isotope Dose: 32.7 mCi
TID: 0.81

## 2022-09-22 MED ORDER — TECHNETIUM TC 99M TETROFOSMIN IV KIT
32.7000 | PACK | Freq: Once | INTRAVENOUS | Status: AC | PRN
  Administered 2022-09-22: 32.7 via INTRAVENOUS

## 2022-09-22 MED ORDER — TECHNETIUM TC 99M TETROFOSMIN IV KIT
11.0000 | PACK | Freq: Once | INTRAVENOUS | Status: AC | PRN
  Administered 2022-09-22: 11 via INTRAVENOUS

## 2022-09-25 ENCOUNTER — Other Ambulatory Visit: Payer: Self-pay | Admitting: Cardiology

## 2022-09-25 NOTE — Addendum Note (Signed)
Addended by: Jenne Campus on: 09/25/2022 12:23 PM   Modules accepted: Orders

## 2022-10-07 ENCOUNTER — Ambulatory Visit: Payer: Medicare Other | Attending: Cardiology | Admitting: Cardiology

## 2022-10-07 ENCOUNTER — Encounter: Payer: Self-pay | Admitting: Cardiology

## 2022-10-07 VITALS — BP 110/68 | HR 67 | Ht 68.0 in | Wt 209.6 lb

## 2022-10-07 DIAGNOSIS — R0609 Other forms of dyspnea: Secondary | ICD-10-CM | POA: Diagnosis not present

## 2022-10-07 DIAGNOSIS — I2510 Atherosclerotic heart disease of native coronary artery without angina pectoris: Secondary | ICD-10-CM | POA: Diagnosis not present

## 2022-10-07 DIAGNOSIS — Z951 Presence of aortocoronary bypass graft: Secondary | ICD-10-CM

## 2022-10-07 DIAGNOSIS — E782 Mixed hyperlipidemia: Secondary | ICD-10-CM

## 2022-10-07 NOTE — Progress Notes (Unsigned)
Cardiology Office Note:    Date:  10/07/2022   ID:  BRENEN Buck, DOB Aug 29, 1956, MRN [id number]  PCP:  Shirline Frees, MD  Cardiologist:  Jenne Campus, MD    Referring MD: Shirline Frees, MD   Chief Complaint  Patient presents with   Results  I am doing very well  History of Present Illness:    Todd Buck is a 66 y.o. male   with past medical significant for coronary artery bypass graft.  In March 2019 he required coronary bypass graft with LIMA to LAD SVG to ramus and SVG to diagonal branch and SVG to RCA.  His ejection fraction was normal.  He also got dyslipidemia. He comes today to my office to discuss results of his stress test.  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  Interestingly he did have echocardiogram done in January of this year which was normal showing no segmental wall motion abnormality with normal ejection fraction.  Overall he is doing well.  He denies have any chest pain, tightness, pressure, burning in chest.  He can walk climb stairs with no difficulties.  He played pickle ball and he is enjoyed doing it.  Past Medical History:  Diagnosis Date   CAD (coronary artery disease), native coronary artery 02/16/2018   Cath 02/16/18    99% prox RCA, PDA 100% with collaterals, Ramus 60 and 80% prox, Prox LAD 95%, mid 70%, mid 100% with collaterals, ostial diagonal 90%, ostial circ 99%  CABG 02/18/18 Dr. Servando Snare with LIMA to LAD, SVG to Ramus, SVG to diag, SVG to RCA   Chronic kidney disease with active medical management without dialysis, stage 3 (moderate) ([place]) 02/15/2018   Coronary arteriosclerosis    Coronary artery disease    Gastroesophageal reflux disease    GERD (gastroesophageal reflux disease)    History of kidney stones    Hyperlipidemia 02/15/2018   Kidney disease, chronic, stage III (GFR 30-59 ml/min) ([place]) 02/15/2018    Kidney stones    Obesity (BMI 30-39.9) 02/15/2018   Pure hypercholesterolemia 12/19/2020   S/P CABG x 4 02/17/2018   Unstable angina ([place]) 02/15/2018    Past Surgical History:  Procedure Laterality Date   COLONOSCOPY  2007   CORONARY ARTERY BYPASS GRAFT N/A 02/17/2018   Procedure: CORONARY ARTERY BYPASS GRAFTING (CABG) x 4 WITH ENDOSCOPIC HARVESTING OF RIGHT SAPHENOUS VEIN. LIMA TO LAD, SVG TO DISTAL RCA, SVG TO DIAGONAL, SVG TO INTERMEDIUS;  Surgeon: Grace Isaac, MD;  Location: [facility];  Service: Open Heart Surgery;  Laterality: N/A;   dental implants     EXTRACORPOREAL SHOCK WAVE LITHOTRIPSY Right 05/30/2019   Procedure: EXTRACORPOREAL SHOCK WAVE LITHOTRIPSY (ESWL);  Surgeon: Ardis Hughs, MD;  Location: [facility];  Service: Urology;  Laterality: Right;   LASIK     EYES   LEFT HEART CATH AND CORONARY ANGIOGRAPHY N/A 02/16/2018   Procedure: LEFT HEART CATH AND CORONARY ANGIOGRAPHY;  Surgeon: Leonie Man, MD;  Location: [facility];  Service: Cardiovascular;  Laterality: N/A;   LITHOTRIPSY     POLYPECTOMY     TEE WITHOUT CARDIOVERSION N/A 02/17/2018   Procedure: TRANSESOPHAGEAL ECHOCARDIOGRAM (TEE);  Surgeon: Grace Isaac, MD;  Location: [facility];  Service: Open Heart Surgery;  Laterality: N/A;    Current Medications: Current Meds  Medication Sig   aspirin EC 81 MG  tablet Take 81 mg by mouth daily. Swallow whole.   atorvastatin (LIPITOR) 80 MG tablet Take 1 tablet (80 mg total) by mouth daily.   ezetimibe (ZETIA) 10 MG tablet Take 1 tablet (10 mg total) by mouth daily.   metoprolol succinate (TOPROL-XL) 50 MG 24 hr tablet Take 1 tablet (50 mg total) by mouth daily.     Allergies:   Patient has no known allergies.   Social History   Socioeconomic History   Marital status: Married    Spouse name: Not on file   Number of children: Not on file   Years of education: Not on file   Highest education level: Not on file  Occupational History   Not on file  Tobacco Use    Smoking status: Former    Passive exposure: Never   Smokeless tobacco: Never   Tobacco comments:    40 years ago  Vaping Use   Vaping Use: Never used  Substance and Sexual Activity   Alcohol use: No   Drug use: No   Sexual activity: Yes  Other Topics Concern   Not on file  Social History Narrative   Not on file   Social Determinants of Health   Financial Resource Strain: Not on file  Food Insecurity: Not on file  Transportation Needs: Not on file  Physical Activity: Not on file  Stress: Not on file  Social Connections: Not on file     Family History: The patient's family history includes Heart disease in his father and mother; Hyperlipidemia in his sister and sister; Renal Disease in his sister. There is no history of Colon cancer, Esophageal cancer, Pancreatic cancer, Rectal cancer, or Stomach cancer. ROS:   Please see the history of present illness.    All 14 point review of systems negative except as described per history of present illness  EKGs/Labs/Other Studies Reviewed:      Recent Labs: 12/10/2021: ALT 29; BUN 23; Creatinine, Ser 1.50; Hemoglobin 13.8; Platelets 168; Potassium 3.1; Sodium 138  Recent Lipid Panel    Component Value Date/Time   CHOL 108 06/22/2020 0833   TRIG 91 06/22/2020 0833   HDL 40 06/22/2020 0833   CHOLHDL 2.7 06/22/2020 0833   CHOLHDL 5.2 02/15/2018 1646   VLDL 50 (H) 02/15/2018 1646   LDLCALC 50 06/22/2020 0833    Physical Exam:    VS:  BP 110/68 (BP Location: Left Arm, Patient Position: Sitting)   Pulse 67   Ht '5\' 8"'$  (1.727 m)   Wt 209 lb 9.6 oz (95.1 kg)   SpO2 98%   BMI 31.87 kg/m     Wt Readings from Last 3 Encounters:  10/07/22 209 lb 9.6 oz (95.1 kg)  09/22/22 209 lb (94.8 kg)  09/17/22 209 lb (94.8 kg)     GEN:  Well nourished, well developed in no acute distress HEENT: Normal NECK: No JVD; No carotid bruits LYMPHATICS: No lymphadenopathy CARDIAC: RRR, no murmurs, no rubs, no gallops RESPIRATORY:  Clear  to auscultation without rales, wheezing or rhonchi  ABDOMEN: Soft, non-tender, non-distended MUSCULOSKELETAL:  No edema; No deformity  SKIN: Warm and dry LOWER EXTREMITIES: no swelling NEUROLOGIC:  Alert and oriented x 3 PSYCHIATRIC:  Normal affect   ASSESSMENT:    1. Coronary artery disease involving native coronary artery of native heart without angina pectoris   2. Mixed hyperlipidemia   3. S/P CABG x 4    PLAN:    In order of problems listed above:  Coronary artery disease  stable from that point review and appropriate medication which I will continue. Question about ischemic cardiomyopathy.  Stress to show possibility of it however echocardiogram done at the beginning of this year showed normal left ventricle ejection fraction.  That need to be clarified and the best way to clarify it is to do echocardiogram which I will schedule him to have. Mixed dyslipidemia he is on a high intense statin form of Lipitor 80 which I will continue I did review his K PN which only his LDL 51 HDL 43 this is from August 20, 2022.  Excellent control of his cholesterol. Status post CABG.  Noted   Medication Adjustments/Labs and Tests Ordered: Current medicines are reviewed at length with the patient today.  Concerns regarding medicines are outlined above.  No orders of the defined types were placed in this encounter.  Medication changes: No orders of the defined types were placed in this encounter.   Signed, Park Liter, MD, [facility] 10/07/2022 8:55 AM    [place]

## 2022-10-07 NOTE — Patient Instructions (Addendum)

## 2022-10-15 ENCOUNTER — Ambulatory Visit (HOSPITAL_BASED_OUTPATIENT_CLINIC_OR_DEPARTMENT_OTHER)
Admission: RE | Admit: 2022-10-15 | Discharge: 2022-10-15 | Disposition: A | Discharge status: Home / Self Care | Payer: Medicare Other | Source: Ambulatory Visit | Attending: Cardiology | Admitting: Cardiology

## 2022-10-15 DIAGNOSIS — R0609 Other forms of dyspnea: Secondary | ICD-10-CM | POA: Insufficient documentation

## 2022-10-15 LAB — ECHOCARDIOGRAM COMPLETE
AR max vel: 2.71 cm2
AV Area VTI: 2.83 cm2
AV Area mean vel: 2.68 cm2
AV Mean grad: 2 mmHg
AV Peak grad: 4.2 mmHg
Ao pk vel: 1.02 m/s
Area-P 1/2: 3.42 cm2
Calc EF: 61.6 %
S' Lateral: 3.5 cm
Single Plane A2C EF: 61.2 %
Single Plane A4C EF: 61.4 %

## 2022-10-17 NOTE — Progress Notes (Signed)
Echocardiogram confirmed presence of diminished left ventricle ejection fraction.  4045% which is not bad, however we need to augment medical therapy, lets do Chem-7 if Chem-7 is fine then will initiate chest

## 2022-10-27 ENCOUNTER — Telehealth: Payer: Self-pay | Admitting: Cardiology

## 2022-10-27 DIAGNOSIS — R0609 Other forms of dyspnea: Secondary | ICD-10-CM

## 2022-10-27 NOTE — Telephone Encounter (Signed)
Follow Up:      Patient's wife called. She said they saw patient's Echo results on My Chart. They need someone to please call and explain the results.

## 2022-10-30 DIAGNOSIS — R0609 Other forms of dyspnea: Secondary | ICD-10-CM | POA: Diagnosis not present

## 2022-10-31 LAB — BASIC METABOLIC PANEL
BUN/Creatinine Ratio: 12 (ref 10–24)
BUN: 20 mg/dL (ref 8–27)
CO2: 27 mmol/L (ref 20–29)
Calcium: 9.7 mg/dL (ref 8.6–10.2)
Chloride: 105 mmol/L (ref 96–106)
Creatinine, Ser: 1.63 mg/dL — ABNORMAL HIGH (ref 0.76–1.27)
Glucose: 112 mg/dL — ABNORMAL HIGH (ref 70–99)
Potassium: 4.7 mmol/L (ref 3.5–5.2)
Sodium: 146 mmol/L — ABNORMAL HIGH (ref 134–144)
eGFR: 46 mL/min/{1.73_m2} — ABNORMAL LOW (ref >59)

## 2022-11-10 ENCOUNTER — Encounter: Payer: Self-pay | Admitting: Cardiology

## 2022-11-10 NOTE — Telephone Encounter (Signed)
Please advise on CDL's and what to do regarding his decreased EF. Next appointment is not for 12 months.

## 2022-11-12 ENCOUNTER — Telehealth: Payer: Self-pay

## 2022-11-12 NOTE — Telephone Encounter (Signed)
Sent My Chart message regarding lab results per Dr. Wendy Poet note. Routed to PCP.

## 2022-11-14 ENCOUNTER — Telehealth: Payer: Self-pay

## 2022-11-17 ENCOUNTER — Other Ambulatory Visit: Payer: Self-pay | Admitting: Cardiology

## 2022-11-18 ENCOUNTER — Telehealth: Payer: Self-pay

## 2022-11-18 NOTE — Telephone Encounter (Signed)
Spoke with patient aware of his appt tomorrow at 1:15 with Su Hoff for DOT physical at [facility] [place] in [place]. Aware to arrive 15 mins early. Aware the cost is 125.00 insurance will not cover for this physical and agreed with plan. I faxed recent notes and recent test (Echo, Stress test and labs.

## 2022-11-18 NOTE — Telephone Encounter (Signed)
-----   Message from Park Liter, MD sent at 11/17/2022 10:38 AM EST ----- Echocardiogram showed presence of myocardial infarction.  This situation he probably need to see Dr. Melanee Spry specializing in DOT examination to be able to determine his visibility to drive ----- Message ----- From: Darrel Reach, CMA Sent: 11/14/2022  12:56 PM EST To: Park Liter, MD  Patient notified of results, he asked if you are approving his DOT cardiac physical. If so, he needs a letter. Please advise.  ----- Message ----- From: Park Liter, MD Sent: 11/05/2022   1:02 PM EST To: Tyler Pita, RN  Kidney function diminished but stable, therefore we will not change any medications we will talk about this during the next visit

## 2022-11-18 NOTE — Telephone Encounter (Signed)
Patient notified of the following. Aware I will fax over the necessary document to Dr. Truman Hayward office. I called Dr. Truman Hayward office and left a message.

## 2022-12-23 DIAGNOSIS — D225 Melanocytic nevi of trunk: Secondary | ICD-10-CM | POA: Diagnosis not present

## 2022-12-23 DIAGNOSIS — L821 Other seborrheic keratosis: Secondary | ICD-10-CM | POA: Diagnosis not present

## 2022-12-23 DIAGNOSIS — L814 Other melanin hyperpigmentation: Secondary | ICD-10-CM | POA: Diagnosis not present

## 2022-12-23 DIAGNOSIS — L578 Other skin changes due to chronic exposure to nonionizing radiation: Secondary | ICD-10-CM | POA: Diagnosis not present

## 2023-01-20 DIAGNOSIS — K080 Exfoliation of teeth due to systemic causes: Secondary | ICD-10-CM | POA: Diagnosis not present

## 2023-02-13 DIAGNOSIS — R361 Hematospermia: Secondary | ICD-10-CM | POA: Diagnosis not present

## 2023-04-15 ENCOUNTER — Other Ambulatory Visit: Payer: Self-pay

## 2023-04-15 ENCOUNTER — Emergency Department (HOSPITAL_BASED_OUTPATIENT_CLINIC_OR_DEPARTMENT_OTHER): Payer: Medicare Other

## 2023-04-15 ENCOUNTER — Inpatient Hospital Stay (HOSPITAL_BASED_OUTPATIENT_CLINIC_OR_DEPARTMENT_OTHER)
Admission: EM | Admit: 2023-04-15 | Discharge: 2023-04-17 | DRG: 287 | Disposition: A | Discharge status: Home / Self Care | Payer: Medicare Other | Attending: Cardiology | Admitting: Cardiology

## 2023-04-15 DIAGNOSIS — R072 Precordial pain: Secondary | ICD-10-CM | POA: Diagnosis not present

## 2023-04-15 DIAGNOSIS — Z841 Family history of disorders of kidney and ureter: Secondary | ICD-10-CM | POA: Diagnosis not present

## 2023-04-15 DIAGNOSIS — Z8249 Family history of ischemic heart disease and other diseases of the circulatory system: Secondary | ICD-10-CM | POA: Diagnosis not present

## 2023-04-15 DIAGNOSIS — I252 Old myocardial infarction: Secondary | ICD-10-CM

## 2023-04-15 DIAGNOSIS — Z951 Presence of aortocoronary bypass graft: Secondary | ICD-10-CM | POA: Diagnosis not present

## 2023-04-15 DIAGNOSIS — I200 Unstable angina: Secondary | ICD-10-CM | POA: Diagnosis not present

## 2023-04-15 DIAGNOSIS — E785 Hyperlipidemia, unspecified: Secondary | ICD-10-CM | POA: Diagnosis present

## 2023-04-15 DIAGNOSIS — R0789 Other chest pain: Secondary | ICD-10-CM | POA: Diagnosis not present

## 2023-04-15 DIAGNOSIS — K219 Gastro-esophageal reflux disease without esophagitis: Secondary | ICD-10-CM | POA: Diagnosis present

## 2023-04-15 DIAGNOSIS — R079 Chest pain, unspecified: Principal | ICD-10-CM

## 2023-04-15 DIAGNOSIS — I5022 Chronic systolic (congestive) heart failure: Secondary | ICD-10-CM | POA: Diagnosis not present

## 2023-04-15 DIAGNOSIS — E669 Obesity, unspecified: Secondary | ICD-10-CM | POA: Diagnosis present

## 2023-04-15 DIAGNOSIS — I25110 Atherosclerotic heart disease of native coronary artery with unstable angina pectoris: Secondary | ICD-10-CM | POA: Diagnosis not present

## 2023-04-15 DIAGNOSIS — Z87891 Personal history of nicotine dependence: Secondary | ICD-10-CM

## 2023-04-15 DIAGNOSIS — E7800 Pure hypercholesterolemia, unspecified: Secondary | ICD-10-CM | POA: Diagnosis present

## 2023-04-15 DIAGNOSIS — Z6830 Body mass index (BMI) 30.0-30.9, adult: Secondary | ICD-10-CM | POA: Diagnosis not present

## 2023-04-15 DIAGNOSIS — N1831 Chronic kidney disease, stage 3a: Secondary | ICD-10-CM | POA: Diagnosis not present

## 2023-04-15 DIAGNOSIS — Z79899 Other long term (current) drug therapy: Secondary | ICD-10-CM | POA: Diagnosis not present

## 2023-04-15 DIAGNOSIS — Z83438 Family history of other disorder of lipoprotein metabolism and other lipidemia: Secondary | ICD-10-CM

## 2023-04-15 DIAGNOSIS — I429 Cardiomyopathy, unspecified: Secondary | ICD-10-CM | POA: Diagnosis not present

## 2023-04-15 DIAGNOSIS — M79605 Pain in left leg: Secondary | ICD-10-CM | POA: Diagnosis not present

## 2023-04-15 DIAGNOSIS — Z7982 Long term (current) use of aspirin: Secondary | ICD-10-CM | POA: Diagnosis not present

## 2023-04-15 DIAGNOSIS — I447 Left bundle-branch block, unspecified: Secondary | ICD-10-CM | POA: Diagnosis present

## 2023-04-15 DIAGNOSIS — N183 Chronic kidney disease, stage 3 unspecified: Secondary | ICD-10-CM | POA: Diagnosis present

## 2023-04-15 DIAGNOSIS — J9811 Atelectasis: Secondary | ICD-10-CM | POA: Diagnosis not present

## 2023-04-15 DIAGNOSIS — I25708 Atherosclerosis of coronary artery bypass graft(s), unspecified, with other forms of angina pectoris: Secondary | ICD-10-CM | POA: Diagnosis not present

## 2023-04-15 DIAGNOSIS — I2510 Atherosclerotic heart disease of native coronary artery without angina pectoris: Secondary | ICD-10-CM | POA: Diagnosis present

## 2023-04-15 LAB — BASIC METABOLIC PANEL
Anion gap: 10 (ref 5–15)
BUN: 19 mg/dL (ref 8–23)
CO2: 24 mmol/L (ref 22–32)
Calcium: 8.8 mg/dL — ABNORMAL LOW (ref 8.9–10.3)
Chloride: 104 mmol/L (ref 98–111)
Creatinine, Ser: 1.53 mg/dL — ABNORMAL HIGH (ref 0.61–1.24)
GFR, Estimated: 50 mL/min — ABNORMAL LOW (ref >60)
Glucose, Bld: 93 mg/dL (ref 70–99)
Potassium: 3.5 mmol/L (ref 3.5–5.1)
Sodium: 138 mmol/L (ref 135–145)

## 2023-04-15 LAB — HEPATIC FUNCTION PANEL
ALT: 31 U/L (ref 0–44)
AST: 30 U/L (ref 15–41)
Albumin: 4.2 g/dL (ref 3.5–5.0)
Alkaline Phosphatase: 73 U/L (ref 38–126)
Bilirubin, Direct: 0.1 mg/dL (ref 0.0–0.2)
Indirect Bilirubin: 0.7 mg/dL (ref 0.3–0.9)
Total Bilirubin: 0.8 mg/dL (ref 0.3–1.2)
Total Protein: 7.5 g/dL (ref 6.5–8.1)

## 2023-04-15 LAB — TROPONIN I (HIGH SENSITIVITY)
Troponin I (High Sensitivity): 3 ng/L (ref <18)
Troponin I (High Sensitivity): 4 ng/L (ref <18)
Troponin I (High Sensitivity): 4 ng/L (ref <18)

## 2023-04-15 LAB — CBC
HCT: 42.3 % (ref 39.0–52.0)
Hemoglobin: 14.5 g/dL (ref 13.0–17.0)
MCH: 31 pg (ref 26.0–34.0)
MCHC: 34.3 g/dL (ref 30.0–36.0)
MCV: 90.4 fL (ref 80.0–100.0)
Platelets: 181 10*3/uL (ref 150–400)
RBC: 4.68 MIL/uL (ref 4.22–5.81)
RDW: 13 % (ref 11.5–15.5)
WBC: 9.1 10*3/uL (ref 4.0–10.5)
nRBC: 0 % (ref 0.0–0.2)

## 2023-04-15 LAB — BRAIN NATRIURETIC PEPTIDE: B Natriuretic Peptide: 11.3 pg/mL (ref 0.0–100.0)

## 2023-04-15 MED ORDER — NITROGLYCERIN 0.4 MG SL SUBL
0.4000 mg | SUBLINGUAL_TABLET | SUBLINGUAL | Status: DC | PRN
  Administered 2023-04-15: 0.4 mg via SUBLINGUAL
  Filled 2023-04-15: qty 1

## 2023-04-15 MED ORDER — IOHEXOL 350 MG/ML SOLN
75.0000 mL | Freq: Once | INTRAVENOUS | Status: AC | PRN
  Administered 2023-04-15: 75 mL via INTRAVENOUS

## 2023-04-15 MED ORDER — ASPIRIN 81 MG PO CHEW
324.0000 mg | CHEWABLE_TABLET | Freq: Once | ORAL | Status: AC
  Administered 2023-04-15: 324 mg via ORAL
  Filled 2023-04-15: qty 4

## 2023-04-15 NOTE — ED Notes (Signed)
Called [facility] for transport and spoke to [place] at 9:15 pm.

## 2023-04-15 NOTE — ED Provider Notes (Signed)
[place] [facility] AT [facility] Provider Note   CSN: [id number] Arrival date & time: 04/15/23  1716     History  Chief Complaint  Patient presents with   Chest Pain    Todd Buck is a 67 y.o. male.  The history is provided by the patient and medical records. No language interpreter was used.  Chest Pain Pain location:  Substernal area Pain quality: aching, dull and pressure   Pain radiates to:  Neck and upper back Pain severity:  Moderate Onset quality:  Gradual Duration:  3 hours Timing:  Constant Progression:  Improving Chronicity:  Recurrent Relieved by:  Nitroglycerin Worsened by:  Nothing Ineffective treatments:  None tried Associated symptoms: back pain and shortness of breath   Associated symptoms: no abdominal pain, no cough, no diaphoresis, no dizziness, no fatigue, no fever, no headache, no lower extremity edema, no nausea, no near-syncope, no palpitations and no vomiting   Risk factors: coronary artery disease        Home Medications Prior to Admission medications   Medication Sig Start Date End Date Taking? Authorizing Provider  aspirin EC 81 MG tablet Take 81 mg by mouth daily. Swallow whole.    [provider]  atorvastatin (LIPITOR) 80 MG tablet Take 1 tablet (80 mg total) by mouth daily. 11/17/22   Georgeanna Lea, MD  ezetimibe (ZETIA) 10 MG tablet Take 1 tablet (10 mg total) by mouth daily. 09/25/22   Georgeanna Lea, MD  metoprolol succinate (TOPROL-XL) 50 MG 24 hr tablet Take 1 tablet (50 mg total) by mouth daily. 09/25/22   Georgeanna Lea, MD      Allergies    Patient has no known allergies.    Review of Systems   Review of Systems  Constitutional:  Negative for diaphoresis, fatigue and fever.  HENT:  Negative for congestion.   Respiratory:  Positive for shortness of breath. Negative for cough and chest tightness.   Cardiovascular:  Positive for chest pain. Negative for palpitations, leg  swelling and near-syncope.  Gastrointestinal:  Negative for abdominal pain, constipation, diarrhea, nausea and vomiting.  Genitourinary:  Negative for dysuria and flank pain.  Musculoskeletal:  Positive for back pain. Negative for neck pain.  Skin:  Negative for rash and wound.  Neurological:  Negative for dizziness, light-headedness and headaches.  Psychiatric/Behavioral:  Negative for agitation.     Physical Exam Updated Vital Signs BP (!) 163/94 (BP Location: Right Arm)   Pulse 80   Temp 98.2 F (36.8 C)   Resp 20   Ht 5\' 8"  (1.727 m)   Wt 90.7 kg   SpO2 99%   BMI 30.41 kg/m  Physical Exam Vitals and nursing note reviewed.  Constitutional:      General: He is not in acute distress.    Appearance: He is well-developed. He is not ill-appearing, toxic-appearing or diaphoretic.  HENT:     Head: Normocephalic and atraumatic.  Eyes:     Conjunctiva/sclera: Conjunctivae normal.  Cardiovascular:     Rate and Rhythm: Normal rate and regular rhythm.     Heart sounds: Normal heart sounds. No murmur heard. Pulmonary:     Effort: Pulmonary effort is normal. No respiratory distress.     Breath sounds: Normal breath sounds. No wheezing, rhonchi or rales.  Chest:     Chest wall: No tenderness.  Abdominal:     Palpations: Abdomen is soft.     Tenderness: There is no abdominal tenderness.  Musculoskeletal:  General: No swelling.     Cervical back: Neck supple.     Right lower leg: No tenderness. No edema.     Left lower leg: No tenderness. No edema.  Skin:    General: Skin is warm and dry.     Capillary Refill: Capillary refill takes less than 2 seconds.  Neurological:     General: No focal deficit present.     Mental Status: He is alert.  Psychiatric:        Mood and Affect: Mood normal.     ED Results / Procedures / Treatments   Labs (all labs ordered are listed, but only abnormal results are displayed) Labs Reviewed  BASIC METABOLIC PANEL - Abnormal; Notable for  the following components:      Result Value   Creatinine, Ser 1.53 (*)    Calcium 8.8 (*)    GFR, Estimated 50 (*)    All other components within normal limits  CBC  HEPATIC FUNCTION PANEL  BRAIN NATRIURETIC PEPTIDE  TROPONIN I (HIGH SENSITIVITY)  TROPONIN I (HIGH SENSITIVITY)  TROPONIN I (HIGH SENSITIVITY)  TROPONIN I (HIGH SENSITIVITY)    EKG EKG Interpretation  Date/Time:  Wednesday Apr 15 2023 17:24:47 EDT Ventricular Rate:  81 PR Interval:  185 QRS Duration: 157 QT Interval:  402 QTC Calculation: 467 R Axis:   84 Text Interpretation: Sinus rhythm Left bundle branch block when compared to prior ECG,  history of similar t wave inversions and depressions. No STEMI Confirmed by Theda Belfast ([id number]) on 04/15/2023 5:33:39 PM  Radiology CT Angio Chest PE W and/or Wo Contrast  Result Date: 04/15/2023 CLINICAL DATA:  Chest pain.  PE suspected EXAM: CT ANGIOGRAPHY CHEST WITH CONTRAST TECHNIQUE: Multidetector CT imaging of the chest was performed using the standard protocol during bolus administration of intravenous contrast. Multiplanar CT image reconstructions and MIPs were obtained to evaluate the vascular anatomy. RADIATION DOSE REDUCTION: This exam was performed according to the departmental dose-optimization program which includes automated exposure control, adjustment of the mA and/or kV according to patient size and/or use of iterative reconstruction technique. CONTRAST:  75mL OMNIPAQUE IOHEXOL 350 MG/ML SOLN COMPARISON:  Chest radiographs 04/15/2023 and CT chest 04/30/2018 FINDINGS: Cardiovascular: Sternotomy and CABG. No pericardial effusion. Satisfactory opacification of the pulmonary arteries to the segmental level. No pulmonary embolism. Coronary artery and aortic atherosclerotic calcification. Mediastinum/Nodes: No thoracic adenopathy.  Unremarkable esophagus. Lungs/Pleura: Mild bibasilar atelectasis/scarring. No focal consolidation, pleural effusion, pneumothorax. Central  airways are patent. Upper Abdomen: No acute abnormality. Musculoskeletal: No chest wall abnormality. No acute or significant osseous findings. Review of the MIP images confirms the above findings. IMPRESSION: 1. No evidence of acute pulmonary embolism or other acute findings in the chest. Aortic Atherosclerosis (ICD10-I70.0). Electronically Signed   By: Minerva Fester M.D.   On: 04/15/2023 19:06   US Venous Img Lower Unilateral Left  Result Date: 04/15/2023 CLINICAL DATA:  Leg pain EXAM: LEFT LOWER EXTREMITY VENOUS DOPPLER ULTRASOUND TECHNIQUE: Gray-scale sonography with compression, as well as color and duplex ultrasound, were performed to evaluate the deep venous system(s) from the level of the common femoral vein through the popliteal and proximal calf veins. COMPARISON:  None Available. FINDINGS: VENOUS Normal compressibility of the common femoral, superficial femoral, and popliteal veins, as well as the visualized calf veins. Visualized portions of profunda femoral vein and great saphenous vein unremarkable. No filling defects to suggest DVT on grayscale or color Doppler imaging. Doppler waveforms show normal direction of venous flow, normal respiratory plasticity and  response to augmentation. Limited views of the contralateral common femoral vein are unremarkable. OTHER None. Limitations: none IMPRESSION: Negative. Electronically Signed   By: Darliss Cheney M.D.   On: 04/15/2023 18:45   DG Chest 2 View  Result Date: 04/15/2023 CLINICAL DATA:  Chest pain EXAM: CHEST - 2 VIEW COMPARISON:  12/10/2021 FINDINGS: Stable cardiomediastinal silhouette. Sternotomy and CABG. Left basilar atelectasis. The right lung is clear. No pleural effusion or pneumothorax. No displaced rib fracture. IMPRESSION: No active disease. Electronically Signed   By: Minerva Fester M.D.   On: 04/15/2023 17:48    Procedures Procedures    Medications Ordered in ED Medications  nitroGLYCERIN (NITROSTAT) SL tablet 0.4 mg (0.4 mg  Sublingual Given 04/15/23 1804)  aspirin chewable tablet 324 mg (324 mg Oral Given 04/15/23 1802)  iohexol (OMNIPAQUE) 350 MG/ML injection 75 mL (75 mLs Intravenous Contrast Given 04/15/23 1844)    ED Course/ Medical Decision Making/ A&P                             Medical Decision Making Amount and/or Complexity of Data Reviewed Labs: ordered. Radiology: ordered.  Risk OTC drugs. Prescription drug management. Decision regarding hospitalization.    FLEETWOOD PIERRON is a 67 y.o. male with history significant for CAD with previous CABG, kidney stones, GERD, CKD, and hyperlipidemia who presents with chest pain.  According to patient, he started having chest discomfort about 2 PM today that feels like a tightness and pressure in his central chest.  He reports it is in the chest and his back and goes to his throat.  He reports no diaphoresis, nausea, or shortness of breath.  Denies dizziness.  He reports this is the same way he felt with his previous MI that he had to have a CABG for.  He denies any trauma.  He does report he had some left leg pain last week that lasted briefly before going away.  No history of DVT or PE but he did drive from [place] today.  He reports his chest discomfort today was somewhat pleuritic and is unclear if it was exertional.  He reports it got up to a 6 out of 10 severity but is now more mild.  He is still having some discomfort.  He has not yet taken his blood pressure medicine or his baby aspirin today.  He denies other complaints including no fevers, chills, congestion, cough, constipation, diarrhea, or urinary changes.  Denies any edema.  EKG on arrival shows some ST depressions and T wave inversions somewhat similar to what he had in the past.  A repeat EKG was obtained and it does show some evolution but continues to have the similar changes.  No STEMI initially.  On exam, lungs clear.  Chest nontender.  I cannot reproduce discomfort.  Abdomen nontender.  He has  intact pulses in extremities and has no significant edema.  He did not have any leg pain or leg tenderness on my exam.  Symmetric smile.  No focal neurologic deficits.  Patient had no carotid bruit on my exam.  Back was nontender.  Patient resting comfortably but still has very mild discomfort in his chest.  Clinically I am somewhat concerned patient is having an NSTEMI given his history and his description of similar symptoms.  Will get delta troponin and will likely call cardiology after workup has further along.  With his unilateral left leg pain, travel to [place], and the pleuritic  nature of discomfort, we will get a left lower extremity ultrasound to rule DVT and will get a CT PE study as well.  Due to his history and similar symptoms anticipate admission for further management workup is completed.  Will give aspirin and nitro to see if this helps his symptoms.  Labs have began to return initial troponin is negative, will trend.  Hepatic function and metabolic panel  only showed elevated creatinine similar to what he has had in the past.  CBC reassuring.  Chest x-ray shows no acute abnormality.  Anticipate calling cardiology after workup is further along.  8:54 PM CT PE study shows no clots.  BNP normal.  Second troponin was normal however clinically I am concerned about the story and EKG.  I spoke to cardiology who reviewed the EKGs and case.  He agrees, he feels patient is admitted to [facility]ology service at [facility] but they would hold on heparin unless troponin rises precipitously.  Will continue to trend troponin and will admit patient to cardiology for further management.  Patient agrees.  Will continue trend troponin and will admit to cardiology at [facility] for further management.        Final Clinical Impression(s) / ED Diagnoses Final diagnoses:  Chest pain, unspecified type   Clinical Impression: 1. Chest pain, unspecified type     Disposition: Admit  This note  was prepared with assistance of Dragon voice recognition software. Occasional wrong-word or sound-a-like substitutions may have occurred due to the inherent limitations of voice recognition software.      Sonyia Muro, Canary Brim, MD 04/15/23 [phone]

## 2023-04-15 NOTE — ED Notes (Signed)
Per EDP [place], pt is to be NPO after midnight.  Pt provided water, per his request. Call bell within reach, no further needs expressed. Will continue to monitor.

## 2023-04-15 NOTE — ED Notes (Signed)
Lab notified to add-on hfp and bnp to previously collected blood  sample.

## 2023-04-15 NOTE — ED Triage Notes (Signed)
Patient presents to ED via POV from home. Here with chest pain that began a few hours PTA. History of bypass. Reports the pain started in his back, radiated into his chest and is now in his throat. Denies shortness of breath, nausea or dizziness.

## 2023-04-15 NOTE — ED Notes (Signed)
ED Provider at bedside. 

## 2023-04-16 ENCOUNTER — Encounter (HOSPITAL_COMMUNITY)
Admission: EM | Disposition: A | Discharge status: Home / Self Care | Payer: Self-pay | Source: Home / Self Care | Attending: Cardiology

## 2023-04-16 ENCOUNTER — Encounter (HOSPITAL_COMMUNITY): Payer: Self-pay | Admitting: Cardiology

## 2023-04-16 ENCOUNTER — Inpatient Hospital Stay (HOSPITAL_COMMUNITY): Payer: Medicare Other

## 2023-04-16 DIAGNOSIS — Z841 Family history of disorders of kidney and ureter: Secondary | ICD-10-CM | POA: Diagnosis not present

## 2023-04-16 DIAGNOSIS — Z79899 Other long term (current) drug therapy: Secondary | ICD-10-CM | POA: Diagnosis not present

## 2023-04-16 DIAGNOSIS — I25708 Atherosclerosis of coronary artery bypass graft(s), unspecified, with other forms of angina pectoris: Secondary | ICD-10-CM

## 2023-04-16 DIAGNOSIS — K219 Gastro-esophageal reflux disease without esophagitis: Secondary | ICD-10-CM | POA: Diagnosis present

## 2023-04-16 DIAGNOSIS — R072 Precordial pain: Secondary | ICD-10-CM | POA: Diagnosis not present

## 2023-04-16 DIAGNOSIS — I200 Unstable angina: Secondary | ICD-10-CM

## 2023-04-16 DIAGNOSIS — Z951 Presence of aortocoronary bypass graft: Secondary | ICD-10-CM | POA: Diagnosis not present

## 2023-04-16 DIAGNOSIS — Z6830 Body mass index (BMI) 30.0-30.9, adult: Secondary | ICD-10-CM | POA: Diagnosis not present

## 2023-04-16 DIAGNOSIS — Z83438 Family history of other disorder of lipoprotein metabolism and other lipidemia: Secondary | ICD-10-CM | POA: Diagnosis not present

## 2023-04-16 DIAGNOSIS — I252 Old myocardial infarction: Secondary | ICD-10-CM | POA: Diagnosis not present

## 2023-04-16 DIAGNOSIS — I429 Cardiomyopathy, unspecified: Secondary | ICD-10-CM | POA: Diagnosis present

## 2023-04-16 DIAGNOSIS — Z8249 Family history of ischemic heart disease and other diseases of the circulatory system: Secondary | ICD-10-CM | POA: Diagnosis not present

## 2023-04-16 DIAGNOSIS — Z7982 Long term (current) use of aspirin: Secondary | ICD-10-CM | POA: Diagnosis not present

## 2023-04-16 DIAGNOSIS — N1831 Chronic kidney disease, stage 3a: Secondary | ICD-10-CM | POA: Diagnosis present

## 2023-04-16 DIAGNOSIS — I447 Left bundle-branch block, unspecified: Secondary | ICD-10-CM | POA: Diagnosis present

## 2023-04-16 DIAGNOSIS — Z87891 Personal history of nicotine dependence: Secondary | ICD-10-CM | POA: Diagnosis not present

## 2023-04-16 DIAGNOSIS — I5022 Chronic systolic (congestive) heart failure: Secondary | ICD-10-CM | POA: Diagnosis present

## 2023-04-16 DIAGNOSIS — E7800 Pure hypercholesterolemia, unspecified: Secondary | ICD-10-CM | POA: Diagnosis present

## 2023-04-16 DIAGNOSIS — R079 Chest pain, unspecified: Secondary | ICD-10-CM | POA: Diagnosis present

## 2023-04-16 DIAGNOSIS — I25110 Atherosclerotic heart disease of native coronary artery with unstable angina pectoris: Secondary | ICD-10-CM | POA: Diagnosis present

## 2023-04-16 DIAGNOSIS — E669 Obesity, unspecified: Secondary | ICD-10-CM | POA: Diagnosis present

## 2023-04-16 HISTORY — PX: LEFT HEART CATH AND CORONARY ANGIOGRAPHY: CATH118249

## 2023-04-16 LAB — CBC
HCT: 42.2 % (ref 39.0–52.0)
Hemoglobin: 14.5 g/dL (ref 13.0–17.0)
MCH: 31.2 pg (ref 26.0–34.0)
MCHC: 34.4 g/dL (ref 30.0–36.0)
MCV: 90.8 fL (ref 80.0–100.0)
Platelets: 159 10*3/uL (ref 150–400)
RBC: 4.65 MIL/uL (ref 4.22–5.81)
RDW: 12.9 % (ref 11.5–15.5)
WBC: 7.8 10*3/uL (ref 4.0–10.5)
nRBC: 0 % (ref 0.0–0.2)

## 2023-04-16 LAB — ECHOCARDIOGRAM COMPLETE
AR max vel: 2.68 cm2
AV Area VTI: 2.68 cm2
AV Area mean vel: 2.43 cm2
AV Mean grad: 3 mmHg
AV Peak grad: 6.3 mmHg
Ao pk vel: 1.25 m/s
Area-P 1/2: 3.37 cm2
Height: 68 in
S' Lateral: 3.6 cm
Single Plane A4C EF: 56.4 %
Weight: 3269.86 oz

## 2023-04-16 LAB — HEPATIC FUNCTION PANEL
ALT: 30 U/L (ref 0–44)
AST: 25 U/L (ref 15–41)
Albumin: 3.7 g/dL (ref 3.5–5.0)
Alkaline Phosphatase: 68 U/L (ref 38–126)
Bilirubin, Direct: 0.1 mg/dL (ref 0.0–0.2)
Indirect Bilirubin: 1.2 mg/dL — ABNORMAL HIGH (ref 0.3–0.9)
Total Bilirubin: 1.3 mg/dL — ABNORMAL HIGH (ref 0.3–1.2)
Total Protein: 6.8 g/dL (ref 6.5–8.1)

## 2023-04-16 LAB — BASIC METABOLIC PANEL
Anion gap: 7 (ref 5–15)
Anion gap: 6 (ref 5–15)
BUN: 15 mg/dL (ref 8–23)
BUN: 16 mg/dL (ref 8–23)
CO2: 26 mmol/L (ref 22–32)
CO2: 27 mmol/L (ref 22–32)
Calcium: 8.9 mg/dL (ref 8.9–10.3)
Calcium: 8.8 mg/dL — ABNORMAL LOW (ref 8.9–10.3)
Chloride: 107 mmol/L (ref 98–111)
Chloride: 106 mmol/L (ref 98–111)
Creatinine, Ser: 1.48 mg/dL — ABNORMAL HIGH (ref 0.61–1.24)
Creatinine, Ser: 1.49 mg/dL — ABNORMAL HIGH (ref 0.61–1.24)
GFR, Estimated: 52 mL/min — ABNORMAL LOW (ref >60)
GFR, Estimated: 51 mL/min — ABNORMAL LOW (ref >60)
Glucose, Bld: 98 mg/dL (ref 70–99)
Glucose, Bld: 88 mg/dL (ref 70–99)
Potassium: 3.9 mmol/L (ref 3.5–5.1)
Potassium: 3.7 mmol/L (ref 3.5–5.1)
Sodium: 140 mmol/L (ref 135–145)
Sodium: 139 mmol/L (ref 135–145)

## 2023-04-16 LAB — LIPID PANEL
Cholesterol: 120 mg/dL (ref 0–200)
HDL: 42 mg/dL (ref >40)
LDL Cholesterol: 62 mg/dL (ref 0–99)
Total CHOL/HDL Ratio: 2.9 RATIO
Triglycerides: 82 mg/dL (ref <150)
VLDL: 16 mg/dL (ref 0–40)

## 2023-04-16 LAB — HEMOGLOBIN A1C
Hgb A1c MFr Bld: 6 % — ABNORMAL HIGH (ref 4.8–5.6)
Mean Plasma Glucose: 125.5 mg/dL

## 2023-04-16 LAB — PROTIME-INR
INR: 1 (ref 0.8–1.2)
Prothrombin Time: 13.4 seconds (ref 11.4–15.2)

## 2023-04-16 LAB — TROPONIN I (HIGH SENSITIVITY): Troponin I (High Sensitivity): 4 ng/L (ref <18)

## 2023-04-16 SURGERY — LEFT HEART CATH AND CORONARY ANGIOGRAPHY
Anesthesia: LOCAL

## 2023-04-16 MED ORDER — FENTANYL CITRATE (PF) 100 MCG/2ML IJ SOLN
INTRAMUSCULAR | Status: AC
  Filled 2023-04-16: qty 2

## 2023-04-16 MED ORDER — SODIUM CHLORIDE 0.9% FLUSH: 3.0000 mL | INTRAVENOUS | Status: DC | PRN

## 2023-04-16 MED ORDER — ONDANSETRON HCL 4 MG/2ML IJ SOLN: 4.0000 mg | Freq: Four times a day (QID) | INTRAMUSCULAR | Status: DC | PRN

## 2023-04-16 MED ORDER — EZETIMIBE 10 MG PO TABS
10.0000 mg | ORAL_TABLET | Freq: Every day | ORAL | Status: DC
  Administered 2023-04-16 – 2023-04-17 (×2): 10 mg via ORAL
  Filled 2023-04-16 (×2): qty 1

## 2023-04-16 MED ORDER — HEPARIN (PORCINE) IN NACL 1000-0.9 UT/500ML-% IV SOLN
INTRAVENOUS | Status: DC | PRN
  Administered 2023-04-16: 500 mL

## 2023-04-16 MED ORDER — SODIUM CHLORIDE 0.9 % IV SOLN: INTRAVENOUS | Status: AC

## 2023-04-16 MED ORDER — HEPARIN SODIUM (PORCINE) 1000 UNIT/ML IJ SOLN
INTRAMUSCULAR | Status: DC | PRN
  Administered 2023-04-16: 5000 [IU] via INTRAVENOUS

## 2023-04-16 MED ORDER — ACETAMINOPHEN 325 MG PO TABS
650.0000 mg | ORAL_TABLET | ORAL | Status: DC | PRN
  Administered 2023-04-16: 650 mg via ORAL
  Filled 2023-04-16: qty 2

## 2023-04-16 MED ORDER — HEPARIN SODIUM (PORCINE) 1000 UNIT/ML IJ SOLN
INTRAMUSCULAR | Status: AC
  Filled 2023-04-16: qty 10

## 2023-04-16 MED ORDER — MIDAZOLAM HCL 2 MG/2ML IJ SOLN
INTRAMUSCULAR | Status: DC | PRN
  Administered 2023-04-16: 2 mg via INTRAVENOUS

## 2023-04-16 MED ORDER — SODIUM CHLORIDE 0.9 % IV SOLN: 250.0000 mL | INTRAVENOUS | Status: DC | PRN

## 2023-04-16 MED ORDER — ATORVASTATIN CALCIUM 80 MG PO TABS
80.0000 mg | ORAL_TABLET | Freq: Every day | ORAL | Status: DC
  Administered 2023-04-16: 80 mg via ORAL
  Filled 2023-04-16: qty 1

## 2023-04-16 MED ORDER — FENTANYL CITRATE (PF) 100 MCG/2ML IJ SOLN
INTRAMUSCULAR | Status: DC | PRN
  Administered 2023-04-16: 50 ug via INTRAVENOUS

## 2023-04-16 MED ORDER — VERAPAMIL HCL 2.5 MG/ML IV SOLN
INTRAVENOUS | Status: AC
  Filled 2023-04-16: qty 2

## 2023-04-16 MED ORDER — ASPIRIN 81 MG PO TBEC
81.0000 mg | DELAYED_RELEASE_TABLET | Freq: Every day | ORAL | Status: DC
  Administered 2023-04-16 – 2023-04-17 (×2): 81 mg via ORAL
  Filled 2023-04-16 (×2): qty 1

## 2023-04-16 MED ORDER — LABETALOL HCL 5 MG/ML IV SOLN: 10.0000 mg | INTRAVENOUS | Status: AC | PRN

## 2023-04-16 MED ORDER — HYDRALAZINE HCL 20 MG/ML IJ SOLN: 10.0000 mg | INTRAMUSCULAR | Status: AC | PRN

## 2023-04-16 MED ORDER — METOPROLOL SUCCINATE ER 50 MG PO TB24
50.0000 mg | ORAL_TABLET | Freq: Every day | ORAL | Status: DC
  Administered 2023-04-16 – 2023-04-17 (×2): 50 mg via ORAL
  Filled 2023-04-16 (×2): qty 1

## 2023-04-16 MED ORDER — ASPIRIN 81 MG PO CHEW: 81.0000 mg | CHEWABLE_TABLET | ORAL | Status: DC

## 2023-04-16 MED ORDER — MIDAZOLAM HCL 2 MG/2ML IJ SOLN
INTRAMUSCULAR | Status: AC
  Filled 2023-04-16: qty 2

## 2023-04-16 MED ORDER — SODIUM CHLORIDE 0.9% FLUSH
3.0000 mL | Freq: Two times a day (BID) | INTRAVENOUS | Status: DC
  Administered 2023-04-16 – 2023-04-17 (×2): 3 mL via INTRAVENOUS

## 2023-04-16 MED ORDER — LIDOCAINE HCL (PF) 1 % IJ SOLN
INTRAMUSCULAR | Status: AC
  Filled 2023-04-16: qty 30

## 2023-04-16 MED ORDER — NITROGLYCERIN 0.4 MG SL SUBL: 0.4000 mg | SUBLINGUAL_TABLET | SUBLINGUAL | Status: DC | PRN

## 2023-04-16 MED ORDER — HEPARIN SODIUM (PORCINE) 5000 UNIT/ML IJ SOLN
5000.0000 [IU] | Freq: Three times a day (TID) | INTRAMUSCULAR | Status: DC
  Administered 2023-04-17: 5000 [IU] via SUBCUTANEOUS
  Filled 2023-04-16: qty 1

## 2023-04-16 MED ORDER — IOHEXOL 350 MG/ML SOLN
INTRAVENOUS | Status: DC | PRN
  Administered 2023-04-16: 90 mL

## 2023-04-16 MED ORDER — LIDOCAINE HCL (PF) 1 % IJ SOLN
INTRAMUSCULAR | Status: DC | PRN
  Administered 2023-04-16 (×2): 2 mL

## 2023-04-16 MED ORDER — HEPARIN SODIUM (PORCINE) 5000 UNIT/ML IJ SOLN
5000.0000 [IU] | Freq: Three times a day (TID) | INTRAMUSCULAR | Status: DC
  Administered 2023-04-16: 5000 [IU] via SUBCUTANEOUS
  Filled 2023-04-16: qty 1

## 2023-04-16 MED ORDER — VERAPAMIL HCL 2.5 MG/ML IV SOLN
INTRAVENOUS | Status: DC | PRN
  Administered 2023-04-16: 10 mL via INTRA_ARTERIAL

## 2023-04-16 MED ORDER — SODIUM CHLORIDE 0.9 % IV SOLN: INTRAVENOUS | Status: DC

## 2023-04-16 SURGICAL SUPPLY — 13 items
CATH INFINITI 5 FR IM (CATHETERS) IMPLANT
CATH INFINITI 5FR AL1 (CATHETERS) IMPLANT
CATH INFINITI 5FR MULTPACK ANG (CATHETERS) IMPLANT
DEVICE RAD COMP TR BAND LRG (VASCULAR PRODUCTS) IMPLANT
ELECT DEFIB PAD ADLT CADENCE (PAD) IMPLANT
GLIDESHEATH SLEND SS 6F .021 (SHEATH) IMPLANT
GUIDEWIRE INQWIRE 1.5J.035X260 (WIRE) IMPLANT
INQWIRE 1.5J .035X260CM (WIRE) ×1
KIT HEART LEFT (KITS) ×1 IMPLANT
PACK CARDIAC CATHETERIZATION (CUSTOM PROCEDURE TRAY) ×1 IMPLANT
SYR MEDRAD MARK 7 150ML (SYRINGE) ×1 IMPLANT
TRANSDUCER W/STOPCOCK (MISCELLANEOUS) ×1 IMPLANT
TUBING CIL FLEX 10 FLL-RA (TUBING) ×1 IMPLANT

## 2023-04-16 NOTE — H&P (View-Only) (Signed)
  Progress Note  Patient Name: Todd Buck Date of Encounter: 04/16/2023  Primary Cardiologist:   None   Subjective   No pain or SOB overnight  Inpatient Medications    Scheduled Meds:  aspirin EC  81 mg Oral Daily   atorvastatin  80 mg Oral Daily   ezetimibe  10 mg Oral Daily   heparin  5,000 Units Subcutaneous Q8H   metoprolol succinate  50 mg Oral Daily   Continuous Infusions:  PRN Meds: acetaminophen, nitroGLYCERIN, ondansetron (ZOFRAN) IV   Vital Signs    Vitals:   04/15/23 2330 04/16/23 0100 04/16/23 0428 04/16/23 0500  BP: 121/80 (!) 148/76 122/82   Pulse: 62 (!) 59 63   Resp: 12 16 14   Temp:  97.6 F (36.4 C) (!) 97.5 F (36.4 C)   TempSrc:  Oral Oral   SpO2: 93% 95% 97%   Weight:  93.7 kg  92.7 kg  Height:  5' 8" (1.727 m)     No intake or output data in the 24 hours ending 04/16/23 0806 Filed Weights   04/15/23 1723 04/16/23 0100 04/16/23 0500  Weight: 90.7 kg 93.7 kg 92.7 kg    Telemetry    NSR - Personally Reviewed  ECG    NA - Personally Reviewed  Physical Exam   GEN: No acute distress.   Neck: No  JVD Cardiac: RRR, no murmurs, rubs, or gallops.  Respiratory: Clear  to auscultation bilaterally. GI: Soft, nontender, non-distended  MS: No  edema; No deformity. Neuro:  Nonfocal  Psych: Normal affect   Labs    Chemistry Recent Labs  Lab 04/15/23 1724 04/15/23 1749  NA 138  --   K 3.5  --   CL 104  --   CO2 24  --   GLUCOSE 93  --   BUN 19  --   CREATININE 1.53*  --   CALCIUM 8.8*  --   PROT  --  7.5  ALBUMIN  --  4.2  AST  --  30  ALT  --  31  ALKPHOS  --  73  BILITOT  --  0.8  GFRNONAA 50*  --   ANIONGAP 10  --      Hematology Recent Labs  Lab 04/15/23 1724  WBC 9.1  RBC 4.68  HGB 14.5  HCT 42.3  MCV 90.4  MCH 31.0  MCHC 34.3  RDW 13.0  PLT 181    Cardiac EnzymesNo results for input(s): "TROPONINI" in the last 168 hours. No results for input(s): "TROPIPOC" in the last 168 hours.   BNP Recent  Labs  Lab 04/15/23 1749  BNP 11.3     DDimer No results for input(s): "DDIMER" in the last 168 hours.   Radiology    CT Angio Chest PE W and/or Wo Contrast  Result Date: 04/15/2023 CLINICAL DATA:  Chest pain.  PE suspected EXAM: CT ANGIOGRAPHY CHEST WITH CONTRAST TECHNIQUE: Multidetector CT imaging of the chest was performed using the standard protocol during bolus administration of intravenous contrast. Multiplanar CT image reconstructions and MIPs were obtained to evaluate the vascular anatomy. RADIATION DOSE REDUCTION: This exam was performed according to the departmental dose-optimization program which includes automated exposure control, adjustment of the mA and/or kV according to patient size and/or use of iterative reconstruction technique. CONTRAST:  75mL OMNIPAQUE IOHEXOL 350 MG/ML SOLN COMPARISON:  Chest radiographs 04/15/2023 and CT chest 04/30/2018 FINDINGS: Cardiovascular: Sternotomy and CABG. No pericardial effusion. Satisfactory opacification of the pulmonary arteries   to the segmental level. No pulmonary embolism. Coronary artery and aortic atherosclerotic calcification. Mediastinum/Nodes: No thoracic adenopathy.  Unremarkable esophagus. Lungs/Pleura: Mild bibasilar atelectasis/scarring. No focal consolidation, pleural effusion, pneumothorax. Central airways are patent. Upper Abdomen: No acute abnormality. Musculoskeletal: No chest wall abnormality. No acute or significant osseous findings. Review of the MIP images confirms the above findings. IMPRESSION: 1. No evidence of acute pulmonary embolism or other acute findings in the chest. Aortic Atherosclerosis (ICD10-I70.0). Electronically Signed   By: Tyler  Stutzman M.D.   On: 04/15/2023 19:06   US Venous Img Lower Unilateral Left  Result Date: 04/15/2023 CLINICAL DATA:  Leg pain EXAM: LEFT LOWER EXTREMITY VENOUS DOPPLER ULTRASOUND TECHNIQUE: Gray-scale sonography with compression, as well as color and duplex ultrasound, were performed to  evaluate the deep venous system(s) from the level of the common femoral vein through the popliteal and proximal calf veins. COMPARISON:  None Available. FINDINGS: VENOUS Normal compressibility of the common femoral, superficial femoral, and popliteal veins, as well as the visualized calf veins. Visualized portions of profunda femoral vein and great saphenous vein unremarkable. No filling defects to suggest DVT on grayscale or color Doppler imaging. Doppler waveforms show normal direction of venous flow, normal respiratory plasticity and response to augmentation. Limited views of the contralateral common femoral vein are unremarkable. OTHER None. Limitations: none IMPRESSION: Negative. Electronically Signed   By: Amy  Guttmann M.D.   On: 04/15/2023 18:45   DG Chest 2 View  Result Date: 04/15/2023 CLINICAL DATA:  Chest pain EXAM: CHEST - 2 VIEW COMPARISON:  12/10/2021 FINDINGS: Stable cardiomediastinal silhouette. Sternotomy and CABG. Left basilar atelectasis. The right lung is clear. No pleural effusion or pneumothorax. No displaced rib fracture. IMPRESSION: No active disease. Electronically Signed   By: Tyler  Stutzman M.D.   On: 04/15/2023 17:48    Cardiac Studies   ECHO:  Pending  Patient Profile     67 y.o. male  with CAD and prior CABG, GERD, CKD, HLD who is being seen 04/16/2023 for the evaluation of chest pain.   Assessment & Plan    UNSTABLE ANGINA:  Plan cath today pending his creat.  The patient understands that risks included but are not limited to stroke (1 in 1000), death (1 in 1000), kidney failure [usually temporary] (1 in 500), bleeding (1 in 200), allergic reaction [possibly serious] (1 in 200).  The patient understands and agrees to proceed.    Note CT yesterday without stenosis.    DYSLIPIDEMIA:  Lipid panel and LPa pending.    CKD IIIA:  Check creat this AM.  Will hydrate now.  CARDIOMYOPATHY:  EF is mildly reduced on previous echo.  Echo pending.  I will try to look back  before discharge to see if he has been on Entresto, spiro or Farxiga.  Looks like BP would allow but he does have increased creat.  Hold these pending his cath.     For questions or updates, please contact [facility] Please consult [email or web address] for contact info under Cardiology/STEMI.   Signed, Doralyn Kirkes, MD  04/16/2023, 8:06 AM    

## 2023-04-16 NOTE — Progress Notes (Signed)
  Echocardiogram 2D Echocardiogram has been performed.  Todd Buck Banker 04/16/2023, 10:14 AM

## 2023-04-16 NOTE — H&P (Signed)
Cardiology Admission History and Physical   Patient ID: ESPN ZEMAN MRN: [id number]; DOB: 04/10/1956   Admission date: 04/15/2023  PCP:  Johny Blamer, MD   [facility] Providers Cardiologist:  None        Chief Complaint:  chest pain  Patient Profile:   Todd Buck is a 67 y.o. male with CAD and prior CABG, GERD, CKD, HLD who is being seen 04/16/2023 for the evaluation of chest pain.  History of Present Illness:   Todd Buck presents with central chest pain that started yesterday. Reports that it radiates to his neck but not having associated diaphoresis, nausea, or shortness of breath. He states that this was similar to how he felt prior to requiring coronary bypass.   On arrival to the ED, ECG has some nonspecific changes that were similar to prior in the setting of LBBB. He was ruled out for PE. Troponin normal and flat.   Transferred for higher care given overall risk and concerning symptoms for unstable angina.    Past Medical History:  Diagnosis Date   CAD (coronary artery disease), native coronary artery 02/16/2018   Cath 02/16/18    99% prox RCA, PDA 100% with collaterals, Ramus 60 and 80% prox, Prox LAD 95%, mid 70%, mid 100% with collaterals, ostial diagonal 90%, ostial circ 99%  CABG 02/18/18 Dr. 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 LIMA TO LAD, SVG TO DISTAL RCA, SVG TO DIAGONAL, SVG TO INTERMEDIUS;  Surgeon: Delight Ovens, MD;  Location: [facility] OR;  Service: Open Heart Surgery;  Laterality: N/A;   dental implants     EXTRACORPOREAL SHOCK WAVE LITHOTRIPSY Right 05/30/2019   Procedure: EXTRACORPOREAL SHOCK WAVE LITHOTRIPSY (ESWL);  Surgeon: Crist Fat, MD;  Location: [facility] ORS;  Service: Urology;  Laterality: Right;   LASIK     EYES   LEFT HEART CATH AND CORONARY ANGIOGRAPHY N/A 02/16/2018   Procedure: LEFT HEART CATH AND CORONARY ANGIOGRAPHY;  Surgeon: Marykay Lex, MD;  Location: [facility] INVASIVE CV LAB;  Service: Cardiovascular;  Laterality: N/A;   LITHOTRIPSY     POLYPECTOMY     TEE WITHOUT CARDIOVERSION N/A 02/17/2018   Procedure: TRANSESOPHAGEAL ECHOCARDIOGRAM (TEE);  Surgeon: Delight Ovens, MD;  Location: [facility] OR;  Service: Open Heart Surgery;  Laterality: N/A;     Medications Prior to Admission: Prior to Admission medications   Medication Sig Start Date End Date Taking? Authorizing Provider  aspirin EC 81 MG tablet Take 81 mg by mouth daily. Swallow whole.   Yes [provider]  atorvastatin (LIPITOR) 80 MG tablet Take 1 tablet (80 mg total) by mouth daily. 11/17/22   Georgeanna Lea,  MD  ezetimibe (ZETIA) 10 MG tablet Take 1 tablet (10 mg total) by mouth daily. 09/25/22   Georgeanna Lea, MD  metoprolol succinate (TOPROL-XL) 50 MG 24 hr tablet Take 1 tablet (50 mg total) by mouth daily. 09/25/22   Georgeanna Lea, MD     Allergies:   No Known Allergies  Social History:   Social History   Socioeconomic History   Marital status: Married    Spouse name: Not on file   Number of children: Not on file   Years of education: Not on file   Highest education level: Not on file  Occupational History   Not on file  Tobacco Use   Smoking status:  Former    Passive exposure: Never   Smokeless tobacco: Never   Tobacco comments:    40 years ago  Vaping Use   Vaping Use: Never used  Substance and Sexual Activity   Alcohol use: No   Drug use: No   Sexual activity: Yes  Other Topics Concern   Not on file  Social History Narrative   Not on file   Social Determinants of Health   Financial Resource Strain: Not on file  Food Insecurity: No Food Insecurity (04/16/2023)   Hunger Vital Sign    Worried About Running Out of Food in the Last Year: Never true    Ran Out of Food in the Last Year: Never true  Transportation Needs: No Transportation Needs (04/16/2023)   PRAPARE - Administrator, Civil Service (Medical): No    Lack of Transportation (Non-Medical): No  Physical Activity: Not on file  Stress: Not on file  Social Connections: Not on file  Intimate Partner Violence: Not At Risk (04/16/2023)   Humiliation, Afraid, Rape, and Kick questionnaire    Fear of Current or Ex-Partner: No    Emotionally Abused: No    Physically Abused: No    Sexually Abused: No    Family History:   The patient's family history includes Heart disease in his father and mother; Hyperlipidemia in his sister and sister; Renal Disease in his sister. There is no history of Colon cancer, Esophageal cancer, Pancreatic cancer, Rectal cancer, or Stomach cancer.    ROS:  Please see the history of present illness.  All other ROS reviewed and negative.     Physical Exam/Data:   Vitals:   04/15/23 2030 04/15/23 2100 04/15/23 2330 04/16/23 0100  BP: 122/80 123/76 121/80 (!) 148/76  Pulse: 63 65 62 (!) 59  Resp: 15 17 12 16   Temp:    97.6 F (36.4 C)  TempSrc:    Oral  SpO2: 94% 98% 93% 95%  Weight:      Height:       No intake or output data in the 24 hours ending 04/16/23 0346    04/15/2023    5:23 PM 10/07/2022    8:37 AM 09/22/2022    7:44 AM  Last 3 Weights  Weight (lbs) 200 lb 209 lb 9.6 oz 209 lb  Weight (kg) 90.719 kg 95.074 kg 94.802  kg     Body mass index is 30.41 kg/m.  General:  Well nourished, well developed, in no acute distress HEENT: normal Neck: no JVD Vascular: No carotid bruits; Distal pulses 2+ bilaterally   Cardiac:  normal S1, S2; RRR; no murmur  Lungs:  clear to auscultation bilaterally, no wheezing, rhonchi or rales  Abd: soft, nontender, no hepatomegaly  Ext: no edema Musculoskeletal:  No deformities,  BUE and BLE strength normal and equal Skin: warm and dry  Neuro:  CNs 2-12 intact, no focal abnormalities noted Psych:  Normal affect    EKG:  The ECG that was done  was personally reviewed and demonstrates LBBB, nonspecifc st t wave changes  Relevant CV Studies: Echo 10/2022: EF 40 to 45%  Laboratory Data:  High Sensitivity Troponin:   Recent Labs  Lab 04/15/23 1724 04/15/23 1924 04/15/23 2158 04/16/23 0105  TROPONINIHS 3 4 4 4       Chemistry Recent Labs  Lab 04/15/23 1724  NA 138  K 3.5  CL 104  CO2 24  GLUCOSE 93  BUN 19  CREATININE 1.53*  CALCIUM 8.8*  GFRNONAA 50*  ANIONGAP 10    Recent Labs  Lab 04/15/23 1749  PROT 7.5  ALBUMIN 4.2  AST 30  ALT 31  ALKPHOS 73  BILITOT 0.8   Lipids No results for input(s): "CHOL", "TRIG", "HDL", "LABVLDL", "LDLCALC", "CHOLHDL" in the last 168 hours. Hematology Recent Labs  Lab 04/15/23 1724  WBC 9.1  RBC 4.68  HGB 14.5  HCT 42.3  MCV 90.4  MCH 31.0  MCHC 34.3  RDW 13.0  PLT 181   Thyroid No results for input(s): "TSH", "FREET4" in the last 168 hours. BNP Recent Labs  Lab 04/15/23 1749  BNP 11.3    DDimer No results for input(s): "DDIMER" in the last 168 hours.   Radiology/Studies:  CT Angio Chest PE W and/or Wo Contrast  Result Date: 04/15/2023 CLINICAL DATA:  Chest pain.  PE suspected EXAM: CT ANGIOGRAPHY CHEST WITH CONTRAST TECHNIQUE: Multidetector CT imaging of the chest was performed using the standard protocol during bolus administration of intravenous contrast. Multiplanar CT image reconstructions and  MIPs were obtained to evaluate the vascular anatomy. RADIATION DOSE REDUCTION: This exam was performed according to the departmental dose-optimization program which includes automated exposure control, adjustment of the mA and/or kV according to patient size and/or use of iterative reconstruction technique. CONTRAST:  75mL OMNIPAQUE IOHEXOL 350 MG/ML SOLN COMPARISON:  Chest radiographs 04/15/2023 and CT chest 04/30/2018 FINDINGS: Cardiovascular: Sternotomy and CABG. No pericardial effusion. Satisfactory opacification of the pulmonary arteries to the segmental level. No pulmonary embolism. Coronary artery and aortic atherosclerotic calcification. Mediastinum/Nodes: No thoracic adenopathy.  Unremarkable esophagus. Lungs/Pleura: Mild bibasilar atelectasis/scarring. No focal consolidation, pleural effusion, pneumothorax. Central airways are patent. Upper Abdomen: No acute abnormality. Musculoskeletal: No chest wall abnormality. No acute or significant osseous findings. Review of the MIP images confirms the above findings. IMPRESSION: 1. No evidence of acute pulmonary embolism or other acute findings in the chest. Aortic Atherosclerosis (ICD10-I70.0). Electronically Signed   By: Minerva Fester M.D.   On: 04/15/2023 19:06   US Venous Img Lower Unilateral Left  Result Date: 04/15/2023 CLINICAL DATA:  Leg pain EXAM: LEFT LOWER EXTREMITY VENOUS DOPPLER ULTRASOUND TECHNIQUE: Gray-scale sonography with compression, as well as color and duplex ultrasound, were performed to evaluate the deep venous system(s) from the level of the common femoral vein through the popliteal and proximal calf veins. COMPARISON:  None Available. FINDINGS: VENOUS Normal compressibility of the common femoral, superficial femoral, and popliteal veins, as well as the visualized calf veins. Visualized portions of profunda femoral vein and great saphenous vein unremarkable. No filling defects to suggest DVT on grayscale or color Doppler imaging.  Doppler waveforms show normal direction of venous flow, normal respiratory plasticity and response to augmentation. Limited views of the contralateral common femoral vein are unremarkable. OTHER None. Limitations: none IMPRESSION: Negative.  Electronically Signed   By: Darliss Cheney M.D.   On: 04/15/2023 18:45   DG Chest 2 View  Result Date: 04/15/2023 CLINICAL DATA:  Chest pain EXAM: CHEST - 2 VIEW COMPARISON:  12/10/2021 FINDINGS: Stable cardiomediastinal silhouette. Sternotomy and CABG. Left basilar atelectasis. The right lung is clear. No pleural effusion or pneumothorax. No displaced rib fracture. IMPRESSION: No active disease. Electronically Signed   By: Minerva Fester M.D.   On: 04/15/2023 17:48     Assessment and Plan:   # Chest pain c/f unstable angina So far his workup has been reassuring however his symptoms are concerning and similar to prior MI and CABG.  Thus given his risk he is being admitted for monitoring.  Will monitor symptoms overnight and if he has no recurrence of his chest pain then could consider nuclear stress test, however feel that cardiac catheterization probably warranted given acute onset of symptoms at rest.  Will also get a echo for evaluation.  Last EF was slightly depressed at 40 to 45%.  Rest of the plan outlined below.  Plan: - npo after midnight for possible lhc (could consider NM stress first) - echo ordered  - continue 81 mg daily  - hold off on p2y12i until cath - continue high intensity statin with atorvastatin 80 mg and ezetimibe; check lipid panel and LP(a) - continue metoprolol succinate 50 mg for beta blocker therapy - will check CBC, CMP, INR, hemoglobin A1c - admit for cardiac tele; strict I&Os; daily weights  - sublingual NTG PRN for pain - can escalate to nitro gtt if needed  - subq heparin for dvt ppx    Risk Assessment/Risk Scores:    TIMI Risk Score for Unstable Angina or Non-ST Elevation MI:   The patient's TIMI risk score is 6, which  indicates a 41% risk of all cause mortality, new or recurrent myocardial infarction or need for urgent revascularization in the next 14 days.       Severity of Illness: The appropriate patient status for this patient is INPATIENT. Inpatient status is judged to be reasonable and necessary in order to provide the required intensity of service to ensure the patient's safety. The patient's presenting symptoms, physical exam findings, and initial radiographic and laboratory data in the context of their chronic comorbidities is felt to place them at high risk for further clinical deterioration. Furthermore, it is not anticipated that the patient will be medically stable for discharge from the hospital within 2 midnights of admission.   * I certify that at the point of admission it is my clinical judgment that the patient will require inpatient hospital care spanning beyond 2 midnights from the point of admission due to high intensity of service, high risk for further deterioration and high frequency of surveillance required.*   For questions or updates, please contact [facility] Please consult [email or web address] for contact info under     Signed, Joellen Jersey, MD  04/16/2023 3:46 AM

## 2023-04-16 NOTE — Progress Notes (Signed)
Mobility Specialist Progress Note:   04/16/23 1210  Mobility  Activity Ambulated independently in hallway  Level of Assistance Independent  Assistive Device Other (Comment) (IV Pole)  Distance Ambulated (ft) 600 ft  Activity Response Tolerated well  Mobility Referral Yes  $Mobility charge 1 Mobility   Pt agreeable to mobility session. Required no physical assistance throughout. Pt denies CP, SOB with exertion. Back in bed with all needs met.   Addison Lank Mobility Specialist Please contact via SecureChat or  Rehab office at [phone]

## 2023-04-16 NOTE — Progress Notes (Signed)
Attempted to remove air from pts TR band. Pt began to bleed immediately. Re-inflated band up to 12 mL and vascular completed patency check. Plan of care ongoing.

## 2023-04-16 NOTE — Interval H&P Note (Signed)
History and Physical Interval Note:  04/16/2023 2:53 PM  Todd Buck  has presented today for surgery, with the diagnosis of unstable angina.  The various methods of treatment have been discussed with the patient and family. After consideration of risks, benefits and other options for treatment, the patient has consented to  Procedure(s): LEFT HEART CATH AND CORONARY ANGIOGRAPHY (N/A) as a surgical intervention.  The patient's history has been reviewed, patient examined, no change in status, stable for surgery.  I have reviewed the patient's chart and labs.  Questions were answered to the patient's satisfaction.    Cath Lab Visit (complete for each Cath Lab visit)  Clinical Evaluation Leading to the Procedure:   ACS: No.  Non-ACS:    Anginal Classification: CCS III  Anti-ischemic medical therapy: Minimal Therapy (1 class of medications)  Non-Invasive Test Results: No non-invasive testing performed  Prior CABG: Previous CABG        Verne Carrow

## 2023-04-16 NOTE — Progress Notes (Signed)
Progress Note  Patient Name: Todd Buck Date of Encounter: 04/16/2023  Primary Cardiologist:   None   Subjective   No pain or SOB overnight  Inpatient Medications    Scheduled Meds:  aspirin EC  81 mg Oral Daily   atorvastatin  80 mg Oral Daily   ezetimibe  10 mg Oral Daily   heparin  5,000 Units Subcutaneous Q8H   metoprolol succinate  50 mg Oral Daily   Continuous Infusions:  PRN Meds: acetaminophen, nitroGLYCERIN, ondansetron (ZOFRAN) IV   Vital Signs    Vitals:   04/15/23 2330 04/16/23 0100 04/16/23 0428 04/16/23 0500  BP: 121/80 (!) 148/76 122/82   Pulse: 62 (!) 59 63   Resp: 12 16 14    Temp:  97.6 F (36.4 C) (!) 97.5 F (36.4 C)   TempSrc:  Oral Oral   SpO2: 93% 95% 97%   Weight:  93.7 kg  92.7 kg  Height:  5\' 8"  (1.727 m)     No intake or output data in the 24 hours ending 04/16/23 0806 Filed Weights   04/15/23 1723 04/16/23 0100 04/16/23 0500  Weight: 90.7 kg 93.7 kg 92.7 kg    Telemetry    NSR - Personally Reviewed  ECG    NA - Personally Reviewed  Physical Exam   GEN: No acute distress.   Neck: No  JVD Cardiac: RRR, no murmurs, rubs, or gallops.  Respiratory: Clear  to auscultation bilaterally. GI: Soft, nontender, non-distended  MS: No  edema; No deformity. Neuro:  Nonfocal  Psych: Normal affect   Labs    Chemistry Recent Labs  Lab 04/15/23 1724 04/15/23 1749  NA 138  --   K 3.5  --   CL 104  --   CO2 24  --   GLUCOSE 93  --   BUN 19  --   CREATININE 1.53*  --   CALCIUM 8.8*  --   PROT  --  7.5  ALBUMIN  --  4.2  AST  --  30  ALT  --  31  ALKPHOS  --  73  BILITOT  --  0.8  GFRNONAA 50*  --   ANIONGAP 10  --      Hematology Recent Labs  Lab 04/15/23 1724  WBC 9.1  RBC 4.68  HGB 14.5  HCT 42.3  MCV 90.4  MCH 31.0  MCHC 34.3  RDW 13.0  PLT 181    Cardiac EnzymesNo results for input(s): "TROPONINI" in the last 168 hours. No results for input(s): "TROPIPOC" in the last 168 hours.   BNP Recent  Labs  Lab 04/15/23 1749  BNP 11.3     DDimer No results for input(s): "DDIMER" in the last 168 hours.   Radiology    CT Angio Chest PE W and/or Wo Contrast  Result Date: 04/15/2023 CLINICAL DATA:  Chest pain.  PE suspected EXAM: CT ANGIOGRAPHY CHEST WITH CONTRAST TECHNIQUE: Multidetector CT imaging of the chest was performed using the standard protocol during bolus administration of intravenous contrast. Multiplanar CT image reconstructions and MIPs were obtained to evaluate the vascular anatomy. RADIATION DOSE REDUCTION: This exam was performed according to the departmental dose-optimization program which includes automated exposure control, adjustment of the mA and/or kV according to patient size and/or use of iterative reconstruction technique. CONTRAST:  75mL OMNIPAQUE IOHEXOL 350 MG/ML SOLN COMPARISON:  Chest radiographs 04/15/2023 and CT chest 04/30/2018 FINDINGS: Cardiovascular: Sternotomy and CABG. No pericardial effusion. Satisfactory opacification of the pulmonary arteries  to the segmental level. No pulmonary embolism. Coronary artery and aortic atherosclerotic calcification. Mediastinum/Nodes: No thoracic adenopathy.  Unremarkable esophagus. Lungs/Pleura: Mild bibasilar atelectasis/scarring. No focal consolidation, pleural effusion, pneumothorax. Central airways are patent. Upper Abdomen: No acute abnormality. Musculoskeletal: No chest wall abnormality. No acute or significant osseous findings. Review of the MIP images confirms the above findings. IMPRESSION: 1. No evidence of acute pulmonary embolism or other acute findings in the chest. Aortic Atherosclerosis (ICD10-I70.0). Electronically Signed   By: Minerva Fester M.D.   On: 04/15/2023 19:06   US Venous Img Lower Unilateral Left  Result Date: 04/15/2023 CLINICAL DATA:  Leg pain EXAM: LEFT LOWER EXTREMITY VENOUS DOPPLER ULTRASOUND TECHNIQUE: Gray-scale sonography with compression, as well as color and duplex ultrasound, were performed to  evaluate the deep venous system(s) from the level of the common femoral vein through the popliteal and proximal calf veins. COMPARISON:  None Available. FINDINGS: VENOUS Normal compressibility of the common femoral, superficial femoral, and popliteal veins, as well as the visualized calf veins. Visualized portions of profunda femoral vein and great saphenous vein unremarkable. No filling defects to suggest DVT on grayscale or color Doppler imaging. Doppler waveforms show normal direction of venous flow, normal respiratory plasticity and response to augmentation. Limited views of the contralateral common femoral vein are unremarkable. OTHER None. Limitations: none IMPRESSION: Negative. Electronically Signed   By: Darliss Cheney M.D.   On: 04/15/2023 18:45   DG Chest 2 View  Result Date: 04/15/2023 CLINICAL DATA:  Chest pain EXAM: CHEST - 2 VIEW COMPARISON:  12/10/2021 FINDINGS: Stable cardiomediastinal silhouette. Sternotomy and CABG. Left basilar atelectasis. The right lung is clear. No pleural effusion or pneumothorax. No displaced rib fracture. IMPRESSION: No active disease. Electronically Signed   By: Minerva Fester M.D.   On: 04/15/2023 17:48    Cardiac Studies   ECHO:  Pending  Patient Profile     67 y.o. male  with CAD and prior CABG, GERD, CKD, HLD who is being seen 04/16/2023 for the evaluation of chest pain.   Assessment & Plan    UNSTABLE ANGINA:  Plan cath today pending his creat.  The patient understands that risks included but are not limited to stroke (1 in 1000), death (1 in 1000), kidney failure [usually temporary] (1 in 500), bleeding (1 in 200), allergic reaction [possibly serious] (1 in 200).  The patient understands and agrees to proceed.    Note CT yesterday without stenosis.    DYSLIPIDEMIA:  Lipid panel and LPa pending.    CKD IIIA:  Check creat this AM.  Will hydrate now.  CARDIOMYOPATHY:  EF is mildly reduced on previous echo.  Echo pending.  I will try to look back  before discharge to see if he has been on Entresto, spiro or [place].  Looks like BP would allow but he does have increased creat.  Hold these pending his cath.     For questions or updates, please contact [facility] Please consult [email or web address] for contact info under Cardiology/STEMI.   Signed, Rollene Rotunda, MD  04/16/2023, 8:06 AM

## 2023-04-16 NOTE — ED Notes (Signed)
[facility] at bedside 

## 2023-04-16 NOTE — Progress Notes (Signed)
  Transition of Care [facility]) Screening Note   Patient Details  Name: Todd Buck Date of Birth: 12/18/1955   Transition of Care [facility]) CM/SW Contact:    Leone Haven, RN Phone Number: 04/16/2023, 1:04 PM    Transition of Care Department [facility]) has reviewed patient , from home, presents with chest pain, for possible cath today. Has PCP and insurance on file.  We will continue to monitor patient advancement through interdisciplinary progression rounds. If new patient transition needs arise, please place a TOC consult.

## 2023-04-16 NOTE — Progress Notes (Signed)
Remaining air (3mL) removed from pt's TR Band. Left radial TR Band removed, site benign, no bleeding or swelling noted. Pressure dsg applied. Pt denies any pain or discomfort. Vascular patency check with 2+ radial pulses. RN will continue to monitor and plan of care continues.

## 2023-04-17 ENCOUNTER — Encounter (HOSPITAL_COMMUNITY): Payer: Self-pay | Admitting: Cardiovascular Disease

## 2023-04-17 DIAGNOSIS — R072 Precordial pain: Secondary | ICD-10-CM | POA: Diagnosis not present

## 2023-04-17 LAB — BASIC METABOLIC PANEL
Anion gap: 7 (ref 5–15)
BUN: 16 mg/dL (ref 8–23)
CO2: 26 mmol/L (ref 22–32)
Calcium: 9 mg/dL (ref 8.9–10.3)
Chloride: 106 mmol/L (ref 98–111)
Creatinine, Ser: 1.47 mg/dL — ABNORMAL HIGH (ref 0.61–1.24)
GFR, Estimated: 52 mL/min — ABNORMAL LOW (ref >60)
Glucose, Bld: 173 mg/dL — ABNORMAL HIGH (ref 70–99)
Potassium: 4 mmol/L (ref 3.5–5.1)
Sodium: 139 mmol/L (ref 135–145)

## 2023-04-17 MED ORDER — ROSUVASTATIN CALCIUM 20 MG PO TABS
40.0000 mg | ORAL_TABLET | Freq: Every day | ORAL | Status: DC
  Administered 2023-04-17: 40 mg via ORAL
  Filled 2023-04-17: qty 2

## 2023-04-17 MED ORDER — ROSUVASTATIN CALCIUM 40 MG PO TABS: 40.0000 mg | ORAL_TABLET | Freq: Every day | ORAL | 3 refills | Status: DC

## 2023-04-17 MED ORDER — NITROGLYCERIN 0.4 MG SL SUBL: 0.4000 mg | SUBLINGUAL_TABLET | SUBLINGUAL | 3 refills | Status: AC | PRN

## 2023-04-17 NOTE — Discharge Summary (Signed)
Discharge Summary    Patient ID: Todd Buck MRN: [id number]; DOB: 1956/10/14  Admit date: 04/15/2023 Discharge date: 04/17/2023  PCP:  Johny Blamer, MD   [facility] Providers Cardiologist:  Gypsy Balsam, MD   Discharge Diagnoses    Principal Problem:   Chest pain Active Problems:   Unstable angina (HCC)   Hyperlipidemia   Chronic kidney disease with active medical management without dialysis, stage 3 (moderate) (HCC)   Obesity (BMI 30-39.9)   CAD (coronary artery disease), native coronary artery   S/P CABG x 4   GERD (gastroesophageal reflux disease)   Diagnostic Studies/Procedures    Left heart cath 04/16/23:   Suezanne Jacquet Cx lesion is 99% stenosed.   Prox Cx to Mid Cx lesion is 45% stenosed.   RPDA lesion is 100% stenosed.   Lat Ramus lesion is 60% stenosed.   Mid RCA lesion is 95% stenosed.   Ost 1st Diag lesion is 100% stenosed.   Prox LAD lesion is 100% stenosed.   SVG graft was visualized by angiography.   SVG graft was visualized by angiography.   SVG graft was visualized by angiography.   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 The patent LIMA graft supplies the mid and distal LAD. The patent vein graft supplies the Diagonal branch.  Severe stenosis in the ostium of the moderate caliber Circumflex, unchanged from last cath. The intermediate branch is supplied by the patent vein graft.  Severe stenosis proximal RCA. Chronic total occlusion of distal PDA. The posterolateral branch is supplied by patent vein graft. This fills the posterolateral branch and mid/distal RCA with competitive antegrade flow through the native RCA in all distal RCA branches.  Normal LV filling pressures.    Recommendations: Patency of all 4 bypass grafts with no focal targets for PCI. As above, the AV groove Circumflex has disease and is not supplied by a vein graft but this stenosis is completely unchanged from his cath  films before CABG. Continue medical management of CAD. Consider long acting nitrates if he has recurrent chest pressure. Will keep overnight to hydrate given CKD.  _____________  Echo 04/16/23  1. Left ventricular ejection fraction, by estimation, is 50 to 55%. Left  ventricular ejection fraction by PLAX is 52 %. The left ventricle has low  normal function. The left ventricle has no regional wall motion  abnormalities. Left ventricular diastolic  parameters are consistent with Grade I diastolic dysfunction (impaired  relaxation).   2. Right ventricular systolic function is moderately reduced. The right  ventricular size is normal. Tricuspid regurgitation signal is inadequate  for assessing PA pressure.   3. The mitral valve is grossly normal. No evidence of mitral valve  regurgitation.   4. The aortic valve is tricuspid. Aortic valve regurgitation is not  visualized. Aortic valve sclerosis is present, with no evidence of aortic  valve stenosis.   5. The inferior vena cava is normal in size with greater than 50%  respiratory variability, suggesting right atrial pressure of 3 mmHg.      History of Present Illness     Todd Buck is a 67 y.o. male with a history of CAD s/p CABG, GERD, CKD, and HLD. He presented to [facility] with chest pain.   Reports that it radiates to his neck but not having associated diaphoresis, nausea, or shortness of breath. He states that this was similar to how he felt prior to requiring coronary bypass.  On arrival to the ED, ECG has some nonspecific changes that were similar to prior in the setting of LBBB. He was ruled out for PE. Troponin normal and flat.    Transferred for higher care given overall risk and concerning symptoms for unstable angina.   Hospital Course     Consultants: none  Chest pain Heart cath yesterday with open grafts, no culprit lesion, no PCI targets, no intervention. He was observed overnight due to the late case and need for  hydration given CKD. Today, he reports no further chest pain. We discussed adding a long acting nitrate or amlodipine. He does not want to add any medications at this time. I will provide a new SL nitro script. Left radial C/D/I.    CKD IIIa sCr today 1.47 (1.49) Baseline appears to be 1.5 Received IVF hydration yesterday prior to and after heart cath.    CAD s/p CABG x 4 Heart cath as above Continue risk factor modification Continue ASA, toprol, lipitor, and zetia As above, does not want to add an anti-anginal.   Hyperlipidemia with LDL goal < 70 04/16/2023: Cholesterol 120; HDL 42; LDL Cholesterol 62; Triglycerides 82; VLDL 16 Continue statin and zetia - changed 80 mg lipitor to 40 mg crestor to see if this improves his LDL.    Chronic systolic heart failure with improved EF Echo 10/2022 with LVEF 40-45% - reduced compared to prior study 12/2021 and in the setting of nonischemic nuclear stress test. Fortunately, echo this admission showed preserved LVEF 50-55%.  GDMT: toprol Discussed medical therapy, he does not want to add medications at this time - would be a good candidate for imdur given chest pain and hx of reduced EF.    Pt seen and examined by Dr. Antoine Poche and deemed stable for discharge. Follow up has been arranged.       Did the patient have an acute coronary syndrome (MI, NSTEMI, STEMI, etc) this admission?:  No                               Did the patient have a percutaneous coronary intervention (stent / angioplasty)?:  No.        The patient will be scheduled for a TOC follow up appointment in 7-14 days.  A message has been sent to the [facility] and Scheduling Pool at the office where the patient should be seen for follow up.  _____________  Discharge Vitals Blood pressure (!) 142/77, pulse 64, temperature (!) 97.5 F (36.4 C), temperature source Oral, resp. rate 17, height 5\' 8"  (1.727 m), weight 93.9 kg, SpO2 98 %.  Filed Weights   04/16/23 0100 04/16/23 0500  04/17/23 0434  Weight: 93.7 kg 92.7 kg 93.9 kg    Labs & Radiologic Studies    CBC Recent Labs    04/15/23 1724 04/16/23 0927  WBC 9.1 7.8  HGB 14.5 14.5  HCT 42.3 42.2  MCV 90.4 90.8  PLT 181 159   Basic Metabolic Panel Recent Labs    16/10/96 1236 04/17/23 0823  NA 139 139  K 3.7 4.0  CL 106 106  CO2 27 26  GLUCOSE 88 173*  BUN 16 16  CREATININE 1.49* 1.47*  CALCIUM 8.8* 9.0   Liver Function Tests Recent Labs    04/15/23 1749 04/16/23 0927  AST 30 25  ALT 31 30  ALKPHOS 73 68  BILITOT 0.8 1.3*  PROT 7.5 6.8  ALBUMIN  4.2 3.7   No results for input(s): "LIPASE", "AMYLASE" in the last 72 hours. High Sensitivity Troponin:   Recent Labs  Lab 04/15/23 1724 04/15/23 1924 04/15/23 2158 04/16/23 0105  TROPONINIHS 3 4 4 4     BNP Invalid input(s): "POCBNP" D-Dimer No results for input(s): "DDIMER" in the last 72 hours. Hemoglobin A1C Recent Labs    04/16/23 0927  HGBA1C 6.0*   Fasting Lipid Panel Recent Labs    04/16/23 0927  CHOL 120  HDL 42  LDLCALC 62  TRIG 82  CHOLHDL 2.9   Thyroid Function Tests No results for input(s): "TSH", "T4TOTAL", "T3FREE", "THYROIDAB" in the last 72 hours.  Invalid input(s): "FREET3" _____________  CARDIAC CATHETERIZATION  Result Date: 04/16/2023   Suezanne Jacquet Cx lesion is 99% stenosed.   Prox Cx to Mid Cx lesion is 45% stenosed.   RPDA lesion is 100% stenosed.   Lat Ramus lesion is 60% stenosed.   Mid RCA lesion is 95% stenosed.   Ost 1st Diag lesion is 100% stenosed.   Prox LAD lesion is 100% stenosed.   SVG graft was visualized by angiography.   SVG graft was visualized by angiography.   SVG graft was visualized by angiography. 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 The patent LIMA graft supplies the mid and distal LAD. The patent vein graft supplies the Diagonal branch. Severe stenosis in the ostium of the moderate caliber Circumflex, unchanged from last cath. The  intermediate branch is supplied by the patent vein graft. Severe stenosis proximal RCA. Chronic total occlusion of distal PDA. The posterolateral branch is supplied by patent vein graft. This fills the posterolateral branch and mid/distal RCA with competitive antegrade flow through the native RCA in all distal RCA branches. Normal LV filling pressures. Recommendations: Patency of all 4 bypass grafts with no focal targets for PCI. As above, the AV groove Circumflex has disease and is not supplied by a vein graft but this stenosis is completely unchanged from his cath films before CABG. Continue medical management of CAD. Consider long acting nitrates if he has recurrent chest pressure. Will keep overnight to hydrate given CKD.   ECHOCARDIOGRAM COMPLETE  Result Date: 04/16/2023    ECHOCARDIOGRAM REPORT   Patient Name:   Todd Buck Date of Exam: 04/16/2023 Medical Rec #:  [id number]      Height:       68.0 in Accession #:    [id number]     Weight:       204.4 lb Date of Birth:  1956/01/30      BSA:          2.063 m Patient Age:    66 years       BP:           124/84 mmHg Patient Gender: M              HR:           61 bpm. Exam Location:  Inpatient Procedure: 2D Echo, Cardiac Doppler and Color Doppler Indications:    Chest pain  History:        Patient has prior history of Echocardiogram examinations, most                 recent 10/15/2022. Ischemic heart disease, CAD and Angina, Prior                 Cardiac Surgery and Prior CABG; Risk Factors:HLD.  Sonographer:  Lucy Antigua Referring Phys: Maurine Minister NARCISSE JR IMPRESSIONS  1. Left ventricular ejection fraction, by estimation, is 50 to 55%. Left ventricular ejection fraction by PLAX is 52 %. The left ventricle has low normal function. The left ventricle has no regional wall motion abnormalities. Left ventricular diastolic parameters are consistent with Grade I diastolic dysfunction (impaired relaxation).  2. Right ventricular systolic function is moderately  reduced. The right ventricular size is normal. Tricuspid regurgitation signal is inadequate for assessing PA pressure.  3. The mitral valve is grossly normal. No evidence of mitral valve regurgitation.  4. The aortic valve is tricuspid. Aortic valve regurgitation is not visualized. Aortic valve sclerosis is present, with no evidence of aortic valve stenosis.  5. The inferior vena cava is normal in size with greater than 50% respiratory variability, suggesting right atrial pressure of 3 mmHg. Comparison(s): Changes from prior study are noted. 10/15/2022: LVEF 40-45%, septal dyssynchrony. FINDINGS  Left Ventricle: Left ventricular ejection fraction, by estimation, is 50 to 55%. Left ventricular ejection fraction by PLAX is 52 %. The left ventricle has low normal function. The left ventricle has no regional wall motion abnormalities. The left ventricular internal cavity size was normal in size. There is no left ventricular hypertrophy. Left ventricular diastolic parameters are consistent with Grade I diastolic dysfunction (impaired relaxation). Normal left ventricular filling pressure. Right Ventricle: The right ventricular size is normal. No increase in right ventricular wall thickness. Right ventricular systolic function is moderately reduced. Tricuspid regurgitation signal is inadequate for assessing PA pressure. Left Atrium: Left atrial size was normal in size. Right Atrium: Right atrial size was normal in size. Pericardium: There is no evidence of pericardial effusion. Mitral Valve: The mitral valve is grossly normal. No evidence of mitral valve regurgitation. Tricuspid Valve: The tricuspid valve is normal in structure. Tricuspid valve regurgitation is not demonstrated. Aortic Valve: The aortic valve is tricuspid. Aortic valve regurgitation is not visualized. Aortic valve sclerosis is present, with no evidence of aortic valve stenosis. Aortic valve mean gradient measures 3.0 mmHg. Aortic valve peak gradient  measures 6.2  mmHg. Aortic valve area, by VTI measures 2.68 cm. Pulmonic Valve: The pulmonic valve was normal in structure. Pulmonic valve regurgitation is not visualized. Aorta: The aortic root and ascending aorta are structurally normal, with no evidence of dilitation. Venous: The inferior vena cava is normal in size with greater than 50% respiratory variability, suggesting right atrial pressure of 3 mmHg. IAS/Shunts: No atrial level shunt detected by color flow Doppler.  LEFT VENTRICLE PLAX 2D LV EF:         Left            Diastology                ventricular     LV e' medial:    7.29 cm/s                ejection        LV E/e' medial:  6.0                fraction by     LV e' lateral:   11.20 cm/s                PLAX is 52      LV E/e' lateral: 3.9                %. LVIDd:         4.90 cm LVIDs:  3.60 cm LV PW:         1.00 cm LV IVS:        1.00 cm LVOT diam:     2.10 cm LV SV:         69 LV SV Index:   33 LVOT Area:     3.46 cm  LV Volumes (MOD) LV vol d, MOD    99.2 ml A4C: LV vol s, MOD    43.3 ml A4C: LV SV MOD A4C:   99.2 ml RIGHT VENTRICLE RV S prime:     7.72 cm/s TAPSE (M-mode): 1.3 cm LEFT ATRIUM             Index        RIGHT ATRIUM           Index LA Vol (A2C):   46.4 ml 22.50 ml/m  RA Area:     10.50 cm LA Vol (A4C):   34.3 ml 16.63 ml/m  RA Volume:   20.70 ml  10.04 ml/m LA Biplane Vol: 40.4 ml 19.59 ml/m  AORTIC VALVE AV Area (Vmax):    2.68 cm AV Area (Vmean):   2.43 cm AV Area (VTI):     2.68 cm AV Vmax:           125.00 cm/s AV Vmean:          86.300 cm/s AV VTI:            0.257 m AV Peak Grad:      6.2 mmHg AV Mean Grad:      3.0 mmHg LVOT Vmax:         96.60 cm/s LVOT Vmean:        60.500 cm/s LVOT VTI:          0.199 m LVOT/AV VTI ratio: 0.77  AORTA Ao Root diam: 3.40 cm Ao Asc diam:  3.30 cm MITRAL VALVE MV Area (PHT): 3.37 cm    SHUNTS MV Decel Time: 225 msec    Systemic VTI:  0.20 m MV E velocity: 44.00 cm/s  Systemic Diam: 2.10 cm MV A velocity: 59.70 cm/s MV E/A  ratio:  0.74 Zoila Shutter MD Electronically signed by Zoila Shutter MD Signature Date/Time: 04/16/2023/2:18:43 PM    Final    CT Angio Chest PE W and/or Wo Contrast  Result Date: 04/15/2023 CLINICAL DATA:  Chest pain.  PE suspected EXAM: CT ANGIOGRAPHY CHEST WITH CONTRAST TECHNIQUE: Multidetector CT imaging of the chest was performed using the standard protocol during bolus administration of intravenous contrast. Multiplanar CT image reconstructions and MIPs were obtained to evaluate the vascular anatomy. RADIATION DOSE REDUCTION: This exam was performed according to the departmental dose-optimization program which includes automated exposure control, adjustment of the mA and/or kV according to patient size and/or use of iterative reconstruction technique. CONTRAST:  75mL OMNIPAQUE IOHEXOL 350 MG/ML SOLN COMPARISON:  Chest radiographs 04/15/2023 and CT chest 04/30/2018 FINDINGS: Cardiovascular: Sternotomy and CABG. No pericardial effusion. Satisfactory opacification of the pulmonary arteries to the segmental level. No pulmonary embolism. Coronary artery and aortic atherosclerotic calcification. Mediastinum/Nodes: No thoracic adenopathy.  Unremarkable esophagus. Lungs/Pleura: Mild bibasilar atelectasis/scarring. No focal consolidation, pleural effusion, pneumothorax. Central airways are patent. Upper Abdomen: No acute abnormality. Musculoskeletal: No chest wall abnormality. No acute or significant osseous findings. Review of the MIP images confirms the above findings. IMPRESSION: 1. No evidence of acute pulmonary embolism or other acute findings in the chest. Aortic Atherosclerosis (ICD10-I70.0). Electronically Signed   By: Angelique Holm.D.  On: 04/15/2023 19:06   US Venous Img Lower Unilateral Left  Result Date: 04/15/2023 CLINICAL DATA:  Leg pain EXAM: LEFT LOWER EXTREMITY VENOUS DOPPLER ULTRASOUND TECHNIQUE: Gray-scale sonography with compression, as well as color and duplex ultrasound, were performed to  evaluate the deep venous system(s) from the level of the common femoral vein through the popliteal and proximal calf veins. COMPARISON:  None Available. FINDINGS: VENOUS Normal compressibility of the common femoral, superficial femoral, and popliteal veins, as well as the visualized calf veins. Visualized portions of profunda femoral vein and great saphenous vein unremarkable. No filling defects to suggest DVT on grayscale or color Doppler imaging. Doppler waveforms show normal direction of venous flow, normal respiratory plasticity and response to augmentation. Limited views of the contralateral common femoral vein are unremarkable. OTHER None. Limitations: none IMPRESSION: Negative. Electronically Signed   By: Darliss Cheney M.D.   On: 04/15/2023 18:45   DG Chest 2 View  Result Date: 04/15/2023 CLINICAL DATA:  Chest pain EXAM: CHEST - 2 VIEW COMPARISON:  12/10/2021 FINDINGS: Stable cardiomediastinal silhouette. Sternotomy and CABG. Left basilar atelectasis. The right lung is clear. No pleural effusion or pneumothorax. No displaced rib fracture. IMPRESSION: No active disease. Electronically Signed   By: Minerva Fester M.D.   On: 04/15/2023 17:48   Disposition   Pt is being discharged home today in good condition.  Follow-up Plans & Appointments     Follow-up Information     Johny Blamer, MD. Go on 04/21/2023.   Specialty: Family Medicine Why: @11 :30am Contact information: [address] [place] [address] [phone]         Flossie Dibble, NP Follow up on 04/27/2023.   Specialty: Cardiology Why: 2:15 pm Contact information: [address], [address] [place] [address] [phone]                Discharge Instructions     Diet - low sodium heart healthy   Complete by: As directed    Diet - low sodium heart healthy   Complete by: As directed    Discharge instructions   Complete by: As directed    No driving for 2 days. No lifting over 5 lbs for 1  week. No sexual activity for 1 week. Keep procedure site clean & dry. If you notice increased pain, swelling, bleeding or pus, call/return!  You may shower, but no soaking baths/hot tubs/pools for 1 week.   Increase activity slowly   Complete by: As directed    Increase activity slowly   Complete by: As directed         Discharge Medications   Allergies as of 04/17/2023   No Known Allergies      Medication List     STOP taking these medications    atorvastatin 80 MG tablet Commonly known as: LIPITOR       TAKE these medications    acetaminophen 500 MG tablet Commonly known as: TYLENOL Take 1,000 mg by mouth as needed for moderate pain.   aspirin EC 81 MG tablet Take 81 mg by mouth at bedtime. Swallow whole.   ezetimibe 10 MG tablet Commonly known as: ZETIA Take 1 tablet (10 mg total) by mouth daily. What changed: when to take this   metoprolol succinate 50 MG 24 hr tablet Commonly known as: TOPROL-XL Take 1 tablet (50 mg total) by mouth daily. What changed: when to take this   nitroGLYCERIN 0.4 MG SL tablet Commonly known as: NITROSTAT  Place 1 tablet (0.4 mg total) under the tongue every 5 (five) minutes x 3 doses as needed for chest pain.   rosuvastatin 40 MG tablet Commonly known as: CRESTOR Take 1 tablet (40 mg total) by mouth daily. Start taking on: Apr 18, 2023            Outstanding Labs/Studies   Check BP with addition of anti-anginal  Duration of Discharge Encounter   Greater than 30 minutes including physician time.  Signed, Roe Rutherford Roda Lauture, PA 04/17/2023, 10:49 AM

## 2023-04-17 NOTE — Progress Notes (Addendum)
Progress Note  Patient Name: Todd Buck Date of Encounter: 04/17/2023  Primary Cardiologist:   Gypsy Balsam, MD   Subjective   No chest pain.  No SOB.   Inpatient Medications    Scheduled Meds:  aspirin EC  81 mg Oral Daily   atorvastatin  80 mg Oral Daily   ezetimibe  10 mg Oral Daily   heparin  5,000 Units Subcutaneous Q8H   metoprolol succinate  50 mg Oral Daily   sodium chloride flush  3 mL Intravenous Q12H   sodium chloride flush  3 mL Intravenous Q12H   Continuous Infusions:  sodium chloride     PRN Meds: sodium chloride, acetaminophen, nitroGLYCERIN, ondansetron (ZOFRAN) IV, sodium chloride flush   Vital Signs    Vitals:   04/16/23 1601 04/16/23 1918 04/17/23 0004 04/17/23 0434  BP: 132/75 (!) 141/82 121/68 120/69  Pulse: 64   62  Resp: 13  15 15   Temp:  98.3 F (36.8 C) 98.3 F (36.8 C) 97.9 F (36.6 C)  TempSrc:  Oral Oral Oral  SpO2: 95%  93% 94%  Weight:    93.9 kg  Height:        Intake/Output Summary (Last 24 hours) at 04/17/2023 0751 Last data filed at 04/17/2023 0618 Gross per 24 hour  Intake 1096.25 ml  Output 2150 ml  Net -1053.75 ml   Filed Weights   04/16/23 0100 04/16/23 0500 04/17/23 0434  Weight: 93.7 kg 92.7 kg 93.9 kg    Telemetry    NSR - Personally Reviewed  ECG    NA - Personally Reviewed  Physical Exam   GEN: No acute distress.   Neck: No  JVD Cardiac: RRR, no murmurs, rubs, or gallops.  Respiratory: Clear  to auscultation bilaterally. GI: Soft, nontender, non-distended  MS: No  edema; No deformity.  Left radial access site without bleeding or bruising Neuro:  Nonfocal  Psych: Normal affect   Labs    Chemistry Recent Labs  Lab 04/15/23 1724 04/15/23 1749 04/16/23 0927 04/16/23 1236  NA 138  --  140 139  K 3.5  --  3.9 3.7  CL 104  --  107 106  CO2 24  --  26 27  GLUCOSE 93  --  98 88  BUN 19  --  15 16  CREATININE 1.53*  --  1.48* 1.49*  CALCIUM 8.8*  --  8.9 8.8*  PROT  --  7.5 6.8  --    ALBUMIN  --  4.2 3.7  --   AST  --  30 25  --   ALT  --  31 30  --   ALKPHOS  --  73 68  --   BILITOT  --  0.8 1.3*  --   GFRNONAA 50*  --  52* 51*  ANIONGAP 10  --  7 6     Hematology Recent Labs  Lab 04/15/23 1724 04/16/23 0927  WBC 9.1 7.8  RBC 4.68 4.65  HGB 14.5 14.5  HCT 42.3 42.2  MCV 90.4 90.8  MCH 31.0 31.2  MCHC 34.3 34.4  RDW 13.0 12.9  PLT 181 159    Cardiac EnzymesNo results for input(s): "TROPONINI" in the last 168 hours. No results for input(s): "TROPIPOC" in the last 168 hours.   BNP Recent Labs  Lab 04/15/23 1749  BNP 11.3     DDimer No results for input(s): "DDIMER" in the last 168 hours.   Radiology    CARDIAC CATHETERIZATION  Result Date: 04/16/2023   Suezanne Jacquet Cx lesion is 99% stenosed.   Prox Cx to Mid Cx lesion is 45% stenosed.   RPDA lesion is 100% stenosed.   Lat Ramus lesion is 60% stenosed.   Mid RCA lesion is 95% stenosed.   Ost 1st Diag lesion is 100% stenosed.   Prox LAD lesion is 100% stenosed.   SVG graft was visualized by angiography.   SVG graft was visualized by angiography.   SVG graft was visualized by angiography. 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 The patent LIMA graft supplies the mid and distal LAD. The patent vein graft supplies the Diagonal branch. Severe stenosis in the ostium of the moderate caliber Circumflex, unchanged from last cath. The intermediate branch is supplied by the patent vein graft. Severe stenosis proximal RCA. Chronic total occlusion of distal PDA. The posterolateral branch is supplied by patent vein graft. This fills the posterolateral branch and mid/distal RCA with competitive antegrade flow through the native RCA in all distal RCA branches. Normal LV filling pressures. Recommendations: Patency of all 4 bypass grafts with no focal targets for PCI. 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 Continue medical management of CAD. Consider long acting nitrates if he has recurrent chest pressure. Will keep overnight to hydrate given CKD.   ECHOCARDIOGRAM COMPLETE  Result Date: 04/16/2023    ECHOCARDIOGRAM REPORT   Patient Name:   Todd Buck Date of Exam: 04/16/2023 Medical Rec #:  [id number]      Height:       68.0 in Accession #:    [id number]     Weight:       204.4 lb Date of Birth:  June 09, 1956      BSA:          2.063 m Patient Age:    66 years       BP:           124/84 mmHg Patient Gender: M              HR:           61 bpm. Exam Location:  Inpatient Procedure: 2D Echo, Cardiac Doppler and Color Doppler Indications:    Chest pain  History:        Patient has prior history of Echocardiogram examinations, most                 recent 10/15/2022. Ischemic heart disease, CAD and Angina, Prior                 Cardiac Surgery and Prior CABG; Risk Factors:HLD.  Sonographer:    Lucy Antigua Referring Phys: Gerre Scull JR IMPRESSIONS  1. Left ventricular ejection fraction, by estimation, is 50 to 55%. Left ventricular ejection fraction by PLAX is 52 %. The left ventricle has low normal function. The left ventricle has no regional wall motion abnormalities. Left ventricular diastolic parameters are consistent with Grade I diastolic dysfunction (impaired relaxation).  2. Right ventricular systolic function is moderately reduced. The right ventricular size is normal. Tricuspid regurgitation signal is inadequate for assessing PA pressure.  3. The mitral valve is grossly normal. No evidence of mitral valve regurgitation.  4. The aortic valve is tricuspid. Aortic valve regurgitation is not visualized. Aortic valve sclerosis is present, with no evidence of aortic  valve stenosis.  5. The inferior vena cava is normal in size with greater than 50% respiratory variability, suggesting right atrial pressure of 3 mmHg. Comparison(s): Changes from prior study are noted.  10/15/2022: LVEF 40-45%, septal dyssynchrony. FINDINGS  Left Ventricle: Left ventricular ejection fraction, by estimation, is 50 to 55%. Left ventricular ejection fraction by PLAX is 52 %. The left ventricle has low normal function. The left ventricle has no regional wall motion abnormalities. The left ventricular internal cavity size was normal in size. There is no left ventricular hypertrophy. Left ventricular diastolic parameters are consistent with Grade I diastolic dysfunction (impaired relaxation). Normal left ventricular filling pressure. Right Ventricle: The right ventricular size is normal. No increase in right ventricular wall thickness. Right ventricular systolic function is moderately reduced. Tricuspid regurgitation signal is inadequate for assessing PA pressure. Left Atrium: Left atrial size was normal in size. Right Atrium: Right atrial size was normal in size. Pericardium: There is no evidence of pericardial effusion. Mitral Valve: The mitral valve is grossly normal. No evidence of mitral valve regurgitation. Tricuspid Valve: The tricuspid valve is normal in structure. Tricuspid valve regurgitation is not demonstrated. Aortic Valve: The aortic valve is tricuspid. Aortic valve regurgitation is not visualized. Aortic valve sclerosis is present, with no evidence of aortic valve stenosis. Aortic valve mean gradient measures 3.0 mmHg. Aortic valve peak gradient measures 6.2  mmHg. Aortic valve area, by VTI measures 2.68 cm. Pulmonic Valve: The pulmonic valve was normal in structure. Pulmonic valve regurgitation is not visualized. Aorta: The aortic root and ascending aorta are structurally normal, with no evidence of dilitation. Venous: The inferior vena cava is normal in size with greater than 50% respiratory variability, suggesting right atrial pressure of 3 mmHg. IAS/Shunts: No atrial level shunt detected by color flow Doppler.  LEFT VENTRICLE PLAX 2D LV EF:         Left            Diastology                 ventricular     LV e' medial:    7.29 cm/s                ejection        LV E/e' medial:  6.0                fraction by     LV e' lateral:   11.20 cm/s                PLAX is 52      LV E/e' lateral: 3.9                %. LVIDd:         4.90 cm LVIDs:         3.60 cm LV PW:         1.00 cm LV IVS:        1.00 cm LVOT diam:     2.10 cm LV SV:         69 LV SV Index:   33 LVOT Area:     3.46 cm  LV Volumes (MOD) LV vol d, MOD    99.2 ml A4C: LV vol s, MOD    43.3 ml A4C: LV SV MOD A4C:   99.2 ml RIGHT VENTRICLE RV S prime:     7.72 cm/s TAPSE (M-mode): 1.3 cm LEFT ATRIUM  Index        RIGHT ATRIUM           Index LA Vol (A2C):   46.4 ml 22.50 ml/m  RA Area:     10.50 cm LA Vol (A4C):   34.3 ml 16.63 ml/m  RA Volume:   20.70 ml  10.04 ml/m LA Biplane Vol: 40.4 ml 19.59 ml/m  AORTIC VALVE AV Area (Vmax):    2.68 cm AV Area (Vmean):   2.43 cm AV Area (VTI):     2.68 cm AV Vmax:           125.00 cm/s AV Vmean:          86.300 cm/s AV VTI:            0.257 m AV Peak Grad:      6.2 mmHg AV Mean Grad:      3.0 mmHg LVOT Vmax:         96.60 cm/s LVOT Vmean:        60.500 cm/s LVOT VTI:          0.199 m LVOT/AV VTI ratio: 0.77  AORTA Ao Root diam: 3.40 cm Ao Asc diam:  3.30 cm MITRAL VALVE MV Area (PHT): 3.37 cm    SHUNTS MV Decel Time: 225 msec    Systemic VTI:  0.20 m MV E velocity: 44.00 cm/s  Systemic Diam: 2.10 cm MV A velocity: 59.70 cm/s MV E/A ratio:  0.74 Zoila Shutter MD Electronically signed by Zoila Shutter MD Signature Date/Time: 04/16/2023/2:18:43 PM    Final    CT Angio Chest PE W and/or Wo Contrast  Result Date: 04/15/2023 CLINICAL DATA:  Chest pain.  PE suspected EXAM: CT ANGIOGRAPHY CHEST WITH CONTRAST TECHNIQUE: Multidetector CT imaging of the chest was performed using the standard protocol during bolus administration of intravenous contrast. Multiplanar CT image reconstructions and MIPs were obtained to evaluate the vascular anatomy. RADIATION DOSE REDUCTION: This exam was  performed according to the departmental dose-optimization program which includes automated exposure control, adjustment of the mA and/or kV according to patient size and/or use of iterative reconstruction technique. CONTRAST:  75mL OMNIPAQUE IOHEXOL 350 MG/ML SOLN COMPARISON:  Chest radiographs 04/15/2023 and CT chest 04/30/2018 FINDINGS: Cardiovascular: Sternotomy and CABG. No pericardial effusion. Satisfactory opacification of the pulmonary arteries to the segmental level. No pulmonary embolism. Coronary artery and aortic atherosclerotic calcification. Mediastinum/Nodes: No thoracic adenopathy.  Unremarkable esophagus. Lungs/Pleura: Mild bibasilar atelectasis/scarring. No focal consolidation, pleural effusion, pneumothorax. Central airways are patent. Upper Abdomen: No acute abnormality. Musculoskeletal: No chest wall abnormality. No acute or significant osseous findings. Review of the MIP images confirms the above findings. IMPRESSION: 1. No evidence of acute pulmonary embolism or other acute findings in the chest. Aortic Atherosclerosis (ICD10-I70.0). Electronically Signed   By: Minerva Fester M.D.   On: 04/15/2023 19:06   US Venous Img Lower Unilateral Left  Result Date: 04/15/2023 CLINICAL DATA:  Leg pain EXAM: LEFT LOWER EXTREMITY VENOUS DOPPLER ULTRASOUND TECHNIQUE: Gray-scale sonography with compression, as well as color and duplex ultrasound, were performed to evaluate the deep venous system(s) from the level of the common femoral vein through the popliteal and proximal calf veins. COMPARISON:  None Available. FINDINGS: VENOUS Normal compressibility of the common femoral, superficial femoral, and popliteal veins, as well as the visualized calf veins. Visualized portions of profunda femoral vein and great saphenous vein unremarkable. No filling defects to suggest DVT on grayscale or color Doppler imaging. Doppler waveforms show normal direction of venous flow,  normal respiratory plasticity and response  to augmentation. Limited views of the contralateral common femoral vein are unremarkable. OTHER None. Limitations: none IMPRESSION: Negative. Electronically Signed   By: Darliss Cheney M.D.   On: 04/15/2023 18:45   DG Chest 2 View  Result Date: 04/15/2023 CLINICAL DATA:  Chest pain EXAM: CHEST - 2 VIEW COMPARISON:  12/10/2021 FINDINGS: Stable cardiomediastinal silhouette. Sternotomy and CABG. Left basilar atelectasis. The right lung is clear. No pleural effusion or pneumothorax. No displaced rib fracture. IMPRESSION: No active disease. Electronically Signed   By: Minerva Fester M.D.   On: 04/15/2023 17:48    Cardiac Studies   Echo   1. Left ventricular ejection fraction, by estimation, is 50 to 55%. Left  ventricular ejection fraction by PLAX is 52 %. The left ventricle has low  normal function. The left ventricle has no regional wall motion  abnormalities. Left ventricular diastolic  parameters are consistent with Grade I diastolic dysfunction (impaired  relaxation).   2. Right ventricular systolic function is moderately reduced. The right  ventricular size is normal. Tricuspid regurgitation signal is inadequate  for assessing PA pressure.   3. The mitral valve is grossly normal. No evidence of mitral valve  regurgitation.   4. The aortic valve is tricuspid. Aortic valve regurgitation is not  visualized. Aortic valve sclerosis is present, with no evidence of aortic  valve stenosis.   5. The inferior vena cava is normal in size with greater than 50%  respiratory variability, suggesting right atrial pressure of 3 mmHg.    Cath:    Diagnostic Dominance: Right     Patient Profile     67 y.o. male with CAD and prior CABG, GERD, CKD, HLD who is being seen 04/16/2023 for the evaluation of chest pain.   Assessment & Plan    CHEST PAIN:   Patent grafts as above.  Plan continued medical management.     DYSLIPIDEMIA:   LDL is 62.  Change Lipitor to Crestor.  LPa is pending   CKD  IIIA:  Creat post cath 1.47.   Mildly elevated.  Follow as an out patient.    CARDIOMYOPATHY:  EF was low normal.  Continue meds as listed.  Avoid ARB/Entresto/Spiro given EF OK and mild CKD.      For questions or updates, please contact [facility] Please consult [email or web address] for contact info under Cardiology/STEMI.   Signed, Rollene Rotunda, MD  04/17/2023, 7:51 AM

## 2023-04-18 LAB — LIPOPROTEIN A (LPA): Lipoprotein (a): 107.7 nmol/L — ABNORMAL HIGH (ref <75.0)

## 2023-04-27 ENCOUNTER — Ambulatory Visit: Payer: Medicare Other | Admitting: Cardiology

## 2023-04-30 NOTE — Progress Notes (Signed)
Office Visit    Patient Name: Todd Buck Date of Encounter: 05/01/2023  PCP:  Johny Blamer, MD   [facility]  Cardiologist:  Gypsy Balsam, MD  Advanced Practice Provider:  No care team member to display Electrophysiologist:  None   HPI    Todd Buck is a 67 y.o. male with a past medical history significant for coronary artery bypass grafting March 2019 (LIMA to LAD, SVG to ramus, SVG to diagonal branch, and SVG to RCA), normal ejection fraction, hyperlipidemia presents today for follow-up appointment.  He came in 1023 discussed results of his recent stress test.  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  Interestingly, he did have echocardiogram done in January of last year which was normal and showed no segmental wall motion abnormality with normal ejection fraction.  Overall, he was doing well at that time.  Denied chest pain/tightness, or burning in his chest.  He can walk up a flight of stairs without issues.  Played pickle ball and enjoyed it.  Today, he presented to the ED May 1 and was having chest pain and an acid reflux symptoms in his throat much like back when he had his CABG in April 2019.  He had a cardiac catheterization which showed all 4 bypasses were open as well as an echocardiogram showing normal heart pump function, no RWMA, grade 1 DD.  Reviewed blood test with him today.  Reviewed lipid panel which is above goal.  LDL goal less than 55.  His statin was changed to Crestor 40.  We we will plan to follow-up with a lipid panel in 2 months.  Otherwise, no further chest pain.  Did bring up long-acting nitrates which he declined at this time.  Reports no shortness of breath nor dyspnea on exertion. Reports no chest pain, pressure, or tightness. No edema, orthopnea, PND. Reports no palpitations.   Past Medical  History    Past Medical History:  Diagnosis Date   CAD (coronary artery disease), native coronary artery 02/16/2018   Cath 02/16/18    99% prox RCA, PDA 100% with collaterals, Ramus 60 and 80% prox, Prox LAD 95%, mid 70%, mid 100% with collaterals, ostial diagonal 90%, ostial circ 99%  CABG 02/18/18 Dr. Tyrone Sage with LIMA to LAD, SVG to Ramus, SVG to diag, SVG to RCA   Chronic kidney disease with active medical management without dialysis, stage 3 (moderate) (HCC) 02/15/2018   Coronary artery disease    Gastroesophageal reflux disease    GERD (gastroesophageal reflux disease)    History of kidney stones    Hyperlipidemia 02/15/2018   Kidney disease, chronic, stage III (GFR 30-59 ml/min) (HCC) 02/15/2018   Kidney stones    Obesity (BMI 30-39.9) 02/15/2018   Pure hypercholesterolemia 12/19/2020   S/P CABG x 4 02/17/2018   Past Surgical History:  Procedure Laterality Date   COLONOSCOPY  2007   CORONARY ARTERY BYPASS GRAFT N/A 02/17/2018   Procedure: CORONARY ARTERY BYPASS GRAFTING (CABG) x 4 WITH ENDOSCOPIC HARVESTING OF RIGHT SAPHENOUS VEIN. LIMA TO LAD, SVG TO DISTAL RCA, SVG TO DIAGONAL, SVG TO INTERMEDIUS;  Surgeon: Delight Ovens, MD;  Location: [facility] OR;  Service: Open Heart Surgery;  Laterality: N/A;   dental implants     EXTRACORPOREAL SHOCK WAVE LITHOTRIPSY Right 05/30/2019   Procedure: EXTRACORPOREAL SHOCK WAVE LITHOTRIPSY (ESWL);  Surgeon:  Crist Fat, MD;  Location: [facility] ORS;  Service: Urology;  Laterality: Right;   LASIK     EYES   LEFT HEART CATH AND CORONARY ANGIOGRAPHY N/A 02/16/2018   Procedure: LEFT HEART CATH AND CORONARY ANGIOGRAPHY;  Surgeon: Marykay Lex, MD;  Location: [facility] INVASIVE CV LAB;  Service: Cardiovascular;  Laterality: N/A;   LEFT HEART CATH AND CORONARY ANGIOGRAPHY N/A 04/16/2023   Procedure: LEFT HEART CATH AND CORONARY ANGIOGRAPHY;  Surgeon: Kathleene Hazel, MD;  Location: [facility] INVASIVE CV LAB;  Service: Cardiovascular;  Laterality: N/A;    LITHOTRIPSY     POLYPECTOMY     TEE WITHOUT CARDIOVERSION N/A 02/17/2018   Procedure: TRANSESOPHAGEAL ECHOCARDIOGRAM (TEE);  Surgeon: Delight Ovens, MD;  Location: [facility] OR;  Service: Open Heart Surgery;  Laterality: N/A;    Allergies  No Known Allergies  EKGs/Labs/Other Studies Reviewed:   The following studies were reviewed today: Cardiac Studies & Procedures   CARDIAC CATHETERIZATION  CARDIAC CATHETERIZATION 04/16/2023  Narrative   Ost Cx lesion is 99% stenosed.   Prox Cx to Mid Cx lesion is 45% stenosed.   RPDA lesion is 100% stenosed.   Lat Ramus lesion is 60% stenosed.   Mid RCA lesion is 95% stenosed.   Ost 1st Diag lesion is 100% stenosed.   Prox LAD lesion is 100% stenosed.   SVG graft was visualized by angiography.   SVG graft was visualized by angiography.   SVG graft was visualized by angiography.  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 The patent LIMA graft supplies the mid and distal LAD. The patent vein graft supplies the Diagonal branch. Severe stenosis in the ostium of the moderate caliber Circumflex, unchanged from last cath. The intermediate branch is supplied by the patent vein graft. Severe stenosis proximal RCA. Chronic total occlusion of distal PDA. The posterolateral branch is supplied by patent vein graft. This fills the posterolateral branch and mid/distal RCA with competitive antegrade flow through the native RCA in all distal RCA branches. Normal LV filling pressures.  Recommendations: Patency of all 4 bypass grafts with no focal targets for PCI. As above, the AV groove Circumflex has disease and is not supplied by a vein graft but this stenosis is completely unchanged from his cath films before CABG. Continue medical management of CAD. Consider long acting nitrates if he has recurrent chest pressure. Will keep overnight to hydrate given CKD.  Findings Coronary Findings Diagnostic  Dominance:  Right  Left Main Vessel is large.  Left Anterior Descending Prox LAD lesion is 100% stenosed. The lesion is chronically occluded.  First Diagonal Branch Vessel is moderate in size. Ost 1st Diag lesion is 100% stenosed. The lesion is chronically occluded.  First Septal Branch Vessel is moderate in size. Vessel is angiographically normal.  Second Diagonal Branch Vessel is small in size.  Third Diagonal Branch Vessel is small in size.  Third Septal Branch  Ramus Intermedius Vessel is large and very large. This vessel branches in the mid vessel to to moderate large-caliber branches.  This is perhaps the only &quot;normal &quot;vessel.  Lateral Ramus Intermedius Vessel is moderate in size. Lat Ramus lesion is 60% stenosed. The lesion is eccentric and irregular.  Left Circumflex Ost Cx lesion is 99% stenosed. The lesion is located at the major branch, discrete and concentric. Prox Cx to Mid Cx lesion is 45% stenosed. The lesion is segmental and eccentric.  Right Coronary Artery Mid RCA lesion  is 95% stenosed. The lesion is located at the bend, eccentric, irregular and ulcerative.  Right Posterior Descending Artery RPDA lesion is 100% stenosed. The lesion is chronically occluded.  Right Posterior Atrioventricular Artery Vessel is moderate in size.  First Right Posterolateral Branch Vessel is small in size.  Second Right Posterolateral Branch Vessel is small in size.  Saphenous Graft To Lat Ramus SVG graft was visualized by angiography.  Saphenous Graft To 1st Diag SVG graft was visualized by angiography.  LIMA Graft To Mid LAD  Saphenous Graft To RPAV SVG graft was visualized by angiography.  Intervention  No interventions have been documented.   CARDIAC CATHETERIZATION  CARDIAC CATHETERIZATION 02/16/2018  Narrative Images from the original result were not included.   Mid RCA lesion is 99% stenosed. RPDA lesion is 100% stenosed.  Lat Ramus-1 lesion is  60% stenosed. Lat Ramus-2 lesion is 80% stenosed.  Ost 1st Diag-1 lesion is 90% stenosed. Ost 1st Diag-2 lesion is 70% stenosed.  Prox LAD lesion is 95% stenosed. Mid LAD lesion is 70% stenosed. Mid LAD to Dist LAD lesion is 100% stenosed.  Ost Cx lesion is 99% stenosed. Prox Cx to Mid Cx lesion is 45% stenosed.  The left ventricular systolic function is normal. The left ventricular ejection fraction is 50-55% by visual estimate.  LV end diastolic pressure is normal.  Severe multivessel disease with extensive disease in the LAD and major diagonal branch as well as the first major branch of the large ramus intermedius. The ostial circumflex as well as mid RCA also have severe disease.  Thankfully, preserved EF with may be inferoapical hypokinesis.  Relatively normal LVEDP of 12 mmHg.   Patient has multivessel disease best suited for bypass surgery.  We have consulted CT surgery. Dr. Donnie Aho was present to review the results and discussed with the patient.  Plan: CVTS Consulted Return to nursing unit for ongoing care.  We will start IV heparin drip. Aggressive risk factor modification   Bryan Lemma, M.D., M.S. Interventional Cardiologist  Pager # [phone] Phone # [phone] [place], [place] [address]  Findings Coronary Findings Diagnostic  Dominance: Right  Left Main Vessel is large.  Left Anterior Descending Prox LAD lesion is 95% stenosed. The lesion is located at the bifurcation, segmental, eccentric and irregular. Mid LAD lesion is 70% stenosed. Mid LAD to Dist LAD lesion is 100% stenosed. The lesion is segmental, ulcerative and chronically occluded with bridging and left-to-left collateral flow. Subtotal occluded -TIMI I flow distal  First Diagonal Branch Vessel is moderate in size. Ost 1st Diag-1 lesion is 90% stenosed. Ost 1st Diag-2 lesion is 70% stenosed.  First Septal Branch Vessel is moderate in size. Vessel is  angiographically normal.  Second Diagonal Branch Vessel is small in size.  Third Diagonal Branch Vessel is small in size. Collaterals 3rd Diag filled by collaterals from 1st Diag.  Third Septal Branch Collaterals 3rd Sept filled by collaterals from 1st Sept.  Ramus Intermedius Vessel is large and very large. This vessel branches in the mid vessel to to moderate large-caliber branches.  This is perhaps the only &quot;normal &quot;vessel.  Lateral Ramus Intermedius Vessel is moderate in size. Lat Ramus-1 lesion is 60% stenosed. The lesion is eccentric and irregular. Lat Ramus-2 lesion is 80% stenosed. The lesion is segmental and irregular.  Left Circumflex Ost Cx lesion is 99% stenosed. The lesion is located at the major branch, discrete and concentric. Prox Cx to Mid Cx lesion is 45% stenosed. The lesion  is segmental and eccentric.  Right Coronary Artery Mid RCA lesion is 99% stenosed. The lesion is located at the bend, eccentric, irregular and ulcerative.  Right Posterior Descending Artery Collaterals RPDA filled by collaterals from 1st Sept.  RPDA lesion is 100% stenosed. The lesion is chronically occluded.  Right Posterior Atrioventricular Artery Vessel is moderate in size.  First Right Posterolateral Branch Vessel is small in size.  Second Right Posterolateral Branch Vessel is small in size.  Intervention  No interventions have been documented.   STRESS TESTS  MYOCARDIAL PERFUSION IMAGING 09/22/2022  Narrative   Findings are consistent with prior myocardial infarction. The study is intermediate risk.   Patient exercised according to the BRUCE protocol for 9:65min achieving 11.   Target HR was achieved (max HR 137bpm; 88% MPHR)   There were isolated ST elevations in aVL during stress with worsening of baseline depressions and T-wave inversions in the inferior leads during stress.   LV perfusion is abnormal. Defect 1: There is a small defect with moderate  reduction in uptake present in the apical septal location(s) that is fixed. There is abnormal wall motion in the defect area. Consistent with infarction.   Left ventricular function is abnormal. Nuclear stress EF: 41 %. The left ventricular ejection fraction is moderately decreased (30-44%). End diastolic cavity size is normal.   Prior study available for comparison from 05/01/2021. Compared to prior study, there continues to be an apical septal infarct. ECG changes are more prominent on current study.   ECHOCARDIOGRAM  ECHOCARDIOGRAM COMPLETE 04/16/2023  Narrative ECHOCARDIOGRAM REPORT    Patient Name:   Todd Buck Date of Exam: 04/16/2023 Medical Rec #:  [id number]      Height:       68.0 in Accession #:    [id number]     Weight:       204.4 lb Date of Birth:  [birth year]      BSA:          2.063 m Patient Age:    66 years       BP:           124/84 mmHg Patient Gender: M              HR:           61 bpm. Exam Location:  Inpatient  Procedure: 2D Echo, Cardiac Doppler and Color Doppler  Indications:    Chest pain  History:        Patient has prior history of Echocardiogram examinations, most recent 10/15/2022. Ischemic heart disease, CAD and Angina, Prior Cardiac Surgery and Prior CABG; Risk Factors:HLD.  Sonographer:    Lucy Antigua Referring Phys: Gerre Scull JR  IMPRESSIONS   1. Left ventricular ejection fraction, by estimation, is 50 to 55%. Left ventricular ejection fraction by PLAX is 52 %. The left ventricle has low normal function. The left ventricle has no regional wall motion abnormalities. Left ventricular diastolic parameters are consistent with Grade I diastolic dysfunction (impaired relaxation). 2. Right ventricular systolic function is moderately reduced. The right ventricular size is normal. Tricuspid regurgitation signal is inadequate for assessing PA pressure. 3. The mitral valve is grossly normal. No evidence of mitral valve regurgitation. 4. The aortic  valve is tricuspid. Aortic valve regurgitation is not visualized. Aortic valve sclerosis is present, with no evidence of aortic valve stenosis. 5. The inferior vena cava is normal in size with greater than 50% respiratory variability, suggesting right atrial pressure of 3 mmHg.  Comparison(s):  Changes from prior study are noted. 10/15/2022: LVEF 40-45%, septal dyssynchrony.  FINDINGS Left Ventricle: Left ventricular ejection fraction, by estimation, is 50 to 55%. Left ventricular ejection fraction by PLAX is 52 %. The left ventricle has low normal function. The left ventricle has no regional wall motion abnormalities. The left ventricular internal cavity size was normal in size. There is no left ventricular hypertrophy. Left ventricular diastolic parameters are consistent with Grade I diastolic dysfunction (impaired relaxation). Normal left ventricular filling pressure.  Right Ventricle: The right ventricular size is normal. No increase in right ventricular wall thickness. Right ventricular systolic function is moderately reduced. Tricuspid regurgitation signal is inadequate for assessing PA pressure.  Left Atrium: Left atrial size was normal in size.  Right Atrium: Right atrial size was normal in size.  Pericardium: There is no evidence of pericardial effusion.  Mitral Valve: The mitral valve is grossly normal. No evidence of mitral valve regurgitation.  Tricuspid Valve: The tricuspid valve is normal in structure. Tricuspid valve regurgitation is not demonstrated.  Aortic Valve: The aortic valve is tricuspid. Aortic valve regurgitation is not visualized. Aortic valve sclerosis is present, with no evidence of aortic valve stenosis. Aortic valve mean gradient measures 3.0 mmHg. Aortic valve peak gradient measures 6.2 mmHg. Aortic valve area, by VTI measures 2.68 cm.  Pulmonic Valve: The pulmonic valve was normal in structure. Pulmonic valve regurgitation is not visualized.  Aorta: The aortic  root and ascending aorta are structurally normal, with no evidence of dilitation.  Venous: The inferior vena cava is normal in size with greater than 50% respiratory variability, suggesting right atrial pressure of 3 mmHg.  IAS/Shunts: No atrial level shunt detected by color flow Doppler.   LEFT VENTRICLE PLAX 2D LV EF:         Left            Diastology ventricular     LV e' medial:    7.29 cm/s ejection        LV E/e' medial:  6.0 fraction by     LV e' lateral:   11.20 cm/s PLAX is 52      LV E/e' lateral: 3.9 %. LVIDd:         4.90 cm LVIDs:         3.60 cm LV PW:         1.00 cm LV IVS:        1.00 cm LVOT diam:     2.10 cm LV SV:         69 LV SV Index:   33 LVOT Area:     3.46 cm  LV Volumes (MOD) LV vol d, MOD    99.2 ml A4C: LV vol s, MOD    43.3 ml A4C: LV SV MOD A4C:   99.2 ml  RIGHT VENTRICLE RV S prime:     7.72 cm/s TAPSE (M-mode): 1.3 cm  LEFT ATRIUM             Index        RIGHT ATRIUM           Index LA Vol (A2C):   46.4 ml 22.50 ml/m  RA Area:     10.50 cm LA Vol (A4C):   34.3 ml 16.63 ml/m  RA Volume:   20.70 ml  10.04 ml/m LA Biplane Vol: 40.4 ml 19.59 ml/m AORTIC VALVE AV Area (Vmax):    2.68 cm AV Area (Vmean):   2.43 cm AV Area (VTI):  2.68 cm AV Vmax:           125.00 cm/s AV Vmean:          86.300 cm/s AV VTI:            0.257 m AV Peak Grad:      6.2 mmHg AV Mean Grad:      3.0 mmHg LVOT Vmax:         96.60 cm/s LVOT Vmean:        60.500 cm/s LVOT VTI:          0.199 m LVOT/AV VTI ratio: 0.77  AORTA Ao Root diam: 3.40 cm Ao Asc diam:  3.30 cm  MITRAL VALVE MV Area (PHT): 3.37 cm    SHUNTS MV Decel Time: 225 msec    Systemic VTI:  0.20 m MV E velocity: 44.00 cm/s  Systemic Diam: 2.10 cm MV A velocity: 59.70 cm/s MV E/A ratio:  0.74  Zoila Shutter MD Electronically signed by Zoila Shutter MD Signature Date/Time: 04/16/2023/2:18:43 PM    Final   TEE  ECHO TEE 02/17/2018  Interpretation Summary  Mitral valve:  No leaflet thickening and calcification present.  Right ventricle: Normal cavity size, wall thickness and ejection fraction.             EKG:  EKG is  ordered today.  The ekg ordered today demonstrates sinus bradycardia, rate 55 bpm.  Known LBBB  Recent Labs: 04/15/2023: B Natriuretic Peptide 11.3 04/16/2023: ALT 30; Hemoglobin 14.5; Platelets 159 04/17/2023: BUN 16; Creatinine, Ser 1.47; Potassium 4.0; Sodium 139  Recent Lipid Panel    Component Value Date/Time   CHOL 120 04/16/2023 0927   CHOL 108 06/22/2020 0833   TRIG 82 04/16/2023 0927   HDL 42 04/16/2023 0927   HDL 40 06/22/2020 0833   CHOLHDL 2.9 04/16/2023 0927   VLDL 16 04/16/2023 0927   LDLCALC 62 04/16/2023 0927   LDLCALC 50 06/22/2020 0833   Home Medications   Current Meds  Medication Sig   aspirin EC 81 MG tablet Take 81 mg by mouth at bedtime. Swallow whole.   ezetimibe (ZETIA) 10 MG tablet Take 1 tablet (10 mg total) by mouth daily. (Patient taking differently: Take 10 mg by mouth at bedtime.)   metoprolol succinate (TOPROL-XL) 50 MG 24 hr tablet Take 1 tablet (50 mg total) by mouth daily. (Patient taking differently: Take 50 mg by mouth at bedtime.)   nitroGLYCERIN (NITROSTAT) 0.4 MG SL tablet Place 1 tablet (0.4 mg total) under the tongue every 5 (five) minutes x 3 doses as needed for chest pain.   rosuvastatin (CRESTOR) 40 MG tablet Take 1 tablet (40 mg total) by mouth daily.     Review of Systems      All other systems reviewed and are otherwise negative except as noted above.  Physical Exam    VS:  BP (!) 108/55   Pulse (!) 55   Ht 5\' 8"  (1.727 m)   Wt 203 lb 3.2 oz (92.2 kg)   SpO2 99%   BMI 30.90 kg/m  , BMI Body mass index is 30.9 kg/m.  Wt Readings from Last 3 Encounters:  05/01/23 203 lb 3.2 oz (92.2 kg)  04/17/23 207 lb 0.2 oz (93.9 kg)  10/07/22 209 lb 9.6 oz (95.1 kg)     GEN: Well nourished, well developed, in no acute distress. HEENT: normal. Neck: Supple, no JVD, carotid bruits, or  masses. Cardiac: RRR, no murmurs, rubs, or gallops. No clubbing, cyanosis, edema.  Radials/PT 2+ and equal bilaterally.  Respiratory:  Respirations regular and unlabored, clear to auscultation bilaterally. GI: Soft, nontender, nondistended. MS: No deformity or atrophy. Skin: Warm and dry, no rash. Neuro:  Strength and sensation are intact. Psych: Normal affect.  Assessment & Plan    Coronary artery disease status post CABG -Recent episode of chest pain with negative ED workup -No significant EKG changes, flat troponin -Cardiac catheterization performed the results.  The patient today, no PCI indicated, open bypass grafts -Echocardiogram also reviewed with the patient with normal LVEF -Continue current medications which include aspirin 81 mg daily, Zetia 10 mg daily, metoprolol succinate 50 mg daily, nitro as needed, Crestor 40 mg daily  Ischemic cardiomyopathy, questionable -recovered EF -continue medications  Mixed dyslipidemia -repeat lipid/LFTs in 2 months -continue Crestor 40mg  daily   CKD stage 3 -Return to baseline 1.4-1.5 -continue to monitor closely       Disposition: Follow up 3 months with Gypsy Balsam, MD or APP.  Signed, Sharlene Dory, PA-C 05/01/2023, 9:17 AM [facility]

## 2023-05-01 ENCOUNTER — Encounter: Payer: Self-pay | Admitting: Physician Assistant

## 2023-05-01 ENCOUNTER — Ambulatory Visit: Payer: Medicare Other | Attending: Physician Assistant | Admitting: Physician Assistant

## 2023-05-01 VITALS — BP 108/55 | HR 55 | Ht 68.0 in | Wt 203.2 lb

## 2023-05-01 DIAGNOSIS — Z951 Presence of aortocoronary bypass graft: Secondary | ICD-10-CM

## 2023-05-01 DIAGNOSIS — E782 Mixed hyperlipidemia: Secondary | ICD-10-CM

## 2023-05-01 DIAGNOSIS — I2510 Atherosclerotic heart disease of native coronary artery without angina pectoris: Secondary | ICD-10-CM | POA: Diagnosis not present

## 2023-05-01 DIAGNOSIS — N183 Chronic kidney disease, stage 3 unspecified: Secondary | ICD-10-CM

## 2023-05-01 NOTE — Patient Instructions (Signed)
Medication Instructions:  Your physician recommends that you continue on your current medications as directed. Please refer to the Current Medication list given to you today. *If you need a refill on your cardiac medications before your next appointment, please call your pharmacy*   Lab Work: 2 MONTHS FASTING LFT & LIPIDS (OK TO DO WITH YOUR PCP) If you have labs (blood work) drawn today and your tests are completely normal, you will receive your results only by: MyChart Message (if you have MyChart) OR A paper copy in the mail If you have any lab test that is abnormal or we need to change your treatment, we will call you to review the results.   Testing/Procedures: NONE ORDERED   Follow-Up: At [facility], you and your health needs are our priority.  As part of our continuing mission to provide you with exceptional heart care, we have created designated Provider Care Teams.  These Care Teams include your primary Cardiologist (physician) and Advanced Practice Providers (APPs -  Physician Assistants and Nurse Practitioners) who all work together to provide you with the care you need, when you need it.  We recommend signing up for the patient portal called "MyChart".  Sign up information is provided on this After Visit Summary.  MyChart is used to connect with patients for Virtual Visits (Telemedicine).  Patients are able to view lab/test results, encounter notes, upcoming appointments, etc.  Non-urgent messages can be sent to your provider as well.   To learn more about what you can do with MyChart, go to [email or web address].    Your next appointment:   3 month(s)  Provider:   Gypsy Balsam, MD     Other Instructions

## 2023-05-29 DIAGNOSIS — M6281 Muscle weakness (generalized): Secondary | ICD-10-CM | POA: Diagnosis not present

## 2023-05-29 DIAGNOSIS — M25511 Pain in right shoulder: Secondary | ICD-10-CM | POA: Diagnosis not present

## 2023-05-29 DIAGNOSIS — H524 Presbyopia: Secondary | ICD-10-CM | POA: Diagnosis not present

## 2023-06-01 ENCOUNTER — Other Ambulatory Visit: Payer: Self-pay | Admitting: Physician Assistant

## 2023-06-01 DIAGNOSIS — M6281 Muscle weakness (generalized): Secondary | ICD-10-CM

## 2023-06-08 ENCOUNTER — Encounter: Payer: Self-pay | Admitting: Family Medicine

## 2023-06-11 DIAGNOSIS — H6092 Unspecified otitis externa, left ear: Secondary | ICD-10-CM | POA: Diagnosis not present

## 2023-06-11 DIAGNOSIS — H6692 Otitis media, unspecified, left ear: Secondary | ICD-10-CM | POA: Diagnosis not present

## 2023-06-12 ENCOUNTER — Ambulatory Visit
Admission: RE | Admit: 2023-06-12 | Discharge: 2023-06-12 | Disposition: A | Discharge status: Home / Self Care | Payer: Medicare Other | Source: Ambulatory Visit | Attending: Physician Assistant | Admitting: Physician Assistant

## 2023-06-12 DIAGNOSIS — M50222 Other cervical disc displacement at C5-C6 level: Secondary | ICD-10-CM | POA: Diagnosis not present

## 2023-06-12 DIAGNOSIS — M47812 Spondylosis without myelopathy or radiculopathy, cervical region: Secondary | ICD-10-CM | POA: Diagnosis not present

## 2023-06-12 DIAGNOSIS — M6281 Muscle weakness (generalized): Secondary | ICD-10-CM

## 2023-06-12 DIAGNOSIS — M50223 Other cervical disc displacement at C6-C7 level: Secondary | ICD-10-CM | POA: Diagnosis not present

## 2023-07-01 DIAGNOSIS — M67911 Unspecified disorder of synovium and tendon, right shoulder: Secondary | ICD-10-CM | POA: Diagnosis not present

## 2023-07-28 ENCOUNTER — Ambulatory Visit: Payer: Medicare Other | Attending: Cardiology | Admitting: Cardiology

## 2023-07-28 ENCOUNTER — Encounter: Payer: Self-pay | Admitting: Cardiology

## 2023-07-28 VITALS — BP 128/74 | HR 57 | Ht 68.0 in | Wt 209.0 lb

## 2023-07-28 DIAGNOSIS — Z951 Presence of aortocoronary bypass graft: Secondary | ICD-10-CM

## 2023-07-28 DIAGNOSIS — E782 Mixed hyperlipidemia: Secondary | ICD-10-CM

## 2023-07-28 DIAGNOSIS — I2510 Atherosclerotic heart disease of native coronary artery without angina pectoris: Secondary | ICD-10-CM

## 2023-07-28 DIAGNOSIS — E7800 Pure hypercholesterolemia, unspecified: Secondary | ICD-10-CM | POA: Diagnosis not present

## 2023-07-28 NOTE — Patient Instructions (Signed)

## 2023-07-28 NOTE — Progress Notes (Signed)
Cardiology Office Note:    Date:  07/28/2023   ID:  Todd Buck, DOB 11/26/1956, MRN [id number]  PCP:  Noberto Retort, MD  Cardiologist:  Gypsy Balsam, MD    Referring MD: Noberto Retort, MD   Chief Complaint  Patient presents with   Follow-up  Doing fine  History of Present Illness:    Todd Buck is a 67 y.o. male  with past medical significant for coronary artery bypass graft.  In March 2019 he required coronary bypass graft with LIMA to LAD SVG to ramus and SVG to diagonal branch and SVG to RCA.  His ejection fraction was normal.  He also got dyslipidemia.  Comes today to months for follow-up overall doing well.  Few weeks ago he ended up going to the hospital because of chest pain he suffered Cardiac catheterization has been done showed patent grafts no target lesions for intervention.  Since that time he is doing well still playing on the regular basis pickleball enjoying it.  No chest pain tightness squeezing pressure burning chest  Past Medical History:  Diagnosis Date   CAD (coronary artery disease), native coronary artery 02/16/2018   Cath 02/16/18    99% prox RCA, PDA 100% with collaterals, Ramus 60 and 80% prox, Prox LAD 95%, mid 70%, mid 100% with collaterals, ostial diagonal 90%, ostial circ 99%  CABG 02/18/18 Dr. Tyrone Sage with LIMA to LAD, SVG to Ramus, SVG to diag, SVG to RCA   Chronic kidney disease with active medical management without dialysis, stage 3 (moderate) (HCC) 02/15/2018   Coronary artery disease    Gastroesophageal reflux disease    GERD (gastroesophageal reflux disease)    History of kidney stones    Hyperlipidemia 02/15/2018   Kidney disease, chronic, stage III (GFR 30-59 ml/min) (HCC) 02/15/2018   Kidney stones    Obesity (BMI 30-39.9) 02/15/2018   Pure hypercholesterolemia 12/19/2020   S/P CABG x 4 02/17/2018    Past Surgical History:  Procedure Laterality Date   COLONOSCOPY  2007   CORONARY ARTERY BYPASS GRAFT N/A 02/17/2018    Procedure: CORONARY ARTERY BYPASS GRAFTING (CABG) x 4 WITH ENDOSCOPIC HARVESTING OF RIGHT SAPHENOUS VEIN. LIMA TO LAD, SVG TO DISTAL RCA, SVG TO DIAGONAL, SVG TO INTERMEDIUS;  Surgeon: Delight Ovens, MD;  Location: [facility] OR;  Service: Open Heart Surgery;  Laterality: N/A;   dental implants     EXTRACORPOREAL SHOCK WAVE LITHOTRIPSY Right 05/30/2019   Procedure: EXTRACORPOREAL SHOCK WAVE LITHOTRIPSY (ESWL);  Surgeon: Crist Fat, MD;  Location: [facility] ORS;  Service: Urology;  Laterality: Right;   LASIK     EYES   LEFT HEART CATH AND CORONARY ANGIOGRAPHY N/A 02/16/2018   Procedure: LEFT HEART CATH AND CORONARY ANGIOGRAPHY;  Surgeon: Marykay Lex, MD;  Location: [facility] INVASIVE CV LAB;  Service: Cardiovascular;  Laterality: N/A;   LEFT HEART CATH AND CORONARY ANGIOGRAPHY N/A 04/16/2023   Procedure: LEFT HEART CATH AND CORONARY ANGIOGRAPHY;  Surgeon: Kathleene Hazel, MD;  Location: [facility] INVASIVE CV LAB;  Service: Cardiovascular;  Laterality: N/A;   LITHOTRIPSY     POLYPECTOMY     TEE WITHOUT CARDIOVERSION N/A 02/17/2018   Procedure: TRANSESOPHAGEAL ECHOCARDIOGRAM (TEE);  Surgeon: Delight Ovens, MD;  [facility];  Service: Open Heart Surgery;  Laterality: N/A;    Current Medications: Current Meds  Medication Sig   aspirin EC 81 MG tablet Take 81 mg by mouth at bedtime. Swallow whole.   ezetimibe (ZETIA) 10 MG tablet  Take 1 tablet (10 mg total) by mouth daily. (Patient taking differently: Take 10 mg by mouth at bedtime.)   metoprolol succinate (TOPROL-XL) 50 MG 24 hr tablet Take 1 tablet (50 mg total) by mouth daily. (Patient taking differently: Take 50 mg by mouth at bedtime.)   nitroGLYCERIN (NITROSTAT) 0.4 MG SL tablet Place 1 tablet (0.4 mg total) under the tongue every 5 (five) minutes x 3 doses as needed for chest pain.   rosuvastatin (CRESTOR) 40 MG tablet Take 1 tablet (40 mg total) by mouth daily.     Allergies:   Patient has no known allergies.   Social History    Socioeconomic History   Marital status: Married    Spouse name: Not on file   Number of children: Not on file   Years of education: Not on file   Highest education level: Not on file  Occupational History   Not on file  Tobacco Use   Smoking status: Former    Passive exposure: Never   Smokeless tobacco: Never   Tobacco comments:    40 years ago  Vaping Use   Vaping status: Never Used  Substance and Sexual Activity   Alcohol use: No   Drug use: No   Sexual activity: Yes  Other Topics Concern   Not on file  Social History Narrative   Not on file   Social Determinants of Health   Financial Resource Strain: Not on file  Food Insecurity: No Food Insecurity (04/16/2023)   Hunger Vital Sign    Worried About Running Out of Food in the Last Year: Never true    Ran Out of Food in the Last Year: Never true  Transportation Needs: No Transportation Needs (04/16/2023)   PRAPARE - Administrator, Civil Service (Medical): No    Lack of Transportation (Non-Medical): No  Physical Activity: Not on file  Stress: Not on file  Social Connections: Not on file     Family History: The patient's family history includes Heart disease in his father and mother; Hyperlipidemia in his sister and sister; Renal Disease in his sister. There is no history of Colon cancer, Esophageal cancer, Pancreatic cancer, Rectal cancer, or Stomach cancer. ROS:   Please see the history of present illness.    All 14 point review of systems negative except as described per history of present illness  EKGs/Labs/Other Studies Reviewed:         Recent Labs: 04/15/2023: B Natriuretic Peptide 11.3 04/16/2023: ALT 30; Hemoglobin 14.5; Platelets 159 04/17/2023: BUN 16; Creatinine, Ser 1.47; Potassium 4.0; Sodium 139  Recent Lipid Panel    Component Value Date/Time   CHOL 120 04/16/2023 0927   CHOL 108 06/22/2020 0833   TRIG 82 04/16/2023 0927   HDL 42 04/16/2023 0927   HDL 40 06/22/2020 0833   CHOLHDL 2.9  04/16/2023 0927   VLDL 16 04/16/2023 0927   LDLCALC 62 04/16/2023 0927   LDLCALC 50 06/22/2020 0833    Physical Exam:    VS:  BP 128/74 (BP Location: Left Arm, Patient Position: Sitting)   Pulse (!) 57   Ht 5\' 8"  (1.727 m)   Wt 209 lb (94.8 kg)   SpO2 97%   BMI 31.78 kg/m     Wt Readings from Last 3 Encounters:  07/28/23 209 lb (94.8 kg)  05/01/23 203 lb 3.2 oz (92.2 kg)  04/17/23 207 lb 0.2 oz (93.9 kg)     GEN:  Well nourished, well developed in no acute  distress HEENT: Normal NECK: No JVD; No carotid bruits LYMPHATICS: No lymphadenopathy CARDIAC: RRR, no murmurs, no rubs, no gallops RESPIRATORY:  Clear to auscultation without rales, wheezing or rhonchi  ABDOMEN: Soft, non-tender, non-distended MUSCULOSKELETAL:  No edema; No deformity  SKIN: Warm and dry LOWER EXTREMITIES: no swelling NEUROLOGIC:  Alert and oriented x 3 PSYCHIATRIC:  Normal affect   ASSESSMENT:    1. Coronary artery disease involving native coronary artery of native heart without angina pectoris   2. Mixed hyperlipidemia   3. S/P CABG x 4   4. Pure hypercholesterolemia    PLAN:    In order of problems listed above:  Coronary disease stable recent cardiac catheterization showing no new lesions.  Will continue monitoring. Mixed dyslipidemia he is on Zetia as well as Crestor 40 which is high intense statin, I did review K PN which show me LDL 62 HDL 42 good control. Essential hypertension blood pressure well-controlled. Overall he is doing well continue present management   Medication Adjustments/Labs and Tests Ordered: Current medicines are reviewed at length with the patient today.  Concerns regarding medicines are outlined above.  No orders of the defined types were placed in this encounter.  Medication changes: No orders of the defined types were placed in this encounter.   Signed, Georgeanna Lea, MD, [facility] 07/28/2023 11:11 AM    [facility]

## 2023-09-02 DIAGNOSIS — E7800 Pure hypercholesterolemia, unspecified: Secondary | ICD-10-CM | POA: Diagnosis not present

## 2023-09-02 DIAGNOSIS — Z125 Encounter for screening for malignant neoplasm of prostate: Secondary | ICD-10-CM | POA: Diagnosis not present

## 2023-09-02 DIAGNOSIS — Z23 Encounter for immunization: Secondary | ICD-10-CM | POA: Diagnosis not present

## 2023-09-02 DIAGNOSIS — Z0000 Encounter for general adult medical examination without abnormal findings: Secondary | ICD-10-CM | POA: Diagnosis not present

## 2023-09-25 ENCOUNTER — Other Ambulatory Visit: Payer: Self-pay | Admitting: Cardiology

## 2023-09-25 NOTE — Telephone Encounter (Signed)
Rx refill sent to pharmacy. 

## 2023-10-21 ENCOUNTER — Telehealth: Payer: Self-pay | Admitting: Cardiology

## 2023-10-21 NOTE — Telephone Encounter (Signed)
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 CB #  [phone] [id number]

## 2023-10-22 NOTE — Telephone Encounter (Signed)
Spoke with pts spouse per DPR. It was decided that pt will go for his DOT Physical then bring the form the DOT physician gives him to Dr. Bing Matter to fill out. She will call if she has any questions.

## 2023-10-22 NOTE — Telephone Encounter (Signed)
Wife called to follow-up on getting patient's paperwork complete for his DOT requirements.  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

## 2024-01-11 DIAGNOSIS — D225 Melanocytic nevi of trunk: Secondary | ICD-10-CM | POA: Diagnosis not present

## 2024-01-11 DIAGNOSIS — L821 Other seborrheic keratosis: Secondary | ICD-10-CM | POA: Diagnosis not present

## 2024-01-11 DIAGNOSIS — L814 Other melanin hyperpigmentation: Secondary | ICD-10-CM | POA: Diagnosis not present

## 2024-01-11 DIAGNOSIS — L578 Other skin changes due to chronic exposure to nonionizing radiation: Secondary | ICD-10-CM | POA: Diagnosis not present

## 2024-01-21 ENCOUNTER — Other Ambulatory Visit: Payer: Self-pay | Admitting: Cardiology

## 2024-03-02 DIAGNOSIS — K080 Exfoliation of teeth due to systemic causes: Secondary | ICD-10-CM | POA: Diagnosis not present

## 2024-03-08 DIAGNOSIS — H524 Presbyopia: Secondary | ICD-10-CM | POA: Diagnosis not present

## 2024-04-05 DIAGNOSIS — I5022 Chronic systolic (congestive) heart failure: Secondary | ICD-10-CM | POA: Insufficient documentation

## 2024-04-05 DIAGNOSIS — I700 Atherosclerosis of aorta: Secondary | ICD-10-CM | POA: Insufficient documentation

## 2024-04-06 ENCOUNTER — Ambulatory Visit: Attending: Cardiology | Admitting: Cardiology

## 2024-04-06 ENCOUNTER — Encounter: Payer: Self-pay | Admitting: Cardiology

## 2024-04-06 VITALS — BP 136/84 | HR 62 | Ht 68.0 in | Wt 213.0 lb

## 2024-04-06 DIAGNOSIS — R002 Palpitations: Secondary | ICD-10-CM

## 2024-04-06 DIAGNOSIS — I2510 Atherosclerotic heart disease of native coronary artery without angina pectoris: Secondary | ICD-10-CM

## 2024-04-06 DIAGNOSIS — K219 Gastro-esophageal reflux disease without esophagitis: Secondary | ICD-10-CM | POA: Diagnosis not present

## 2024-04-06 DIAGNOSIS — Z951 Presence of aortocoronary bypass graft: Secondary | ICD-10-CM

## 2024-04-06 NOTE — Patient Instructions (Signed)

## 2024-04-06 NOTE — Progress Notes (Unsigned)
 Cardiology Office Note:    Date:  04/06/2024   ID:  Todd Buck, DOB 01/05/1956, MRN [id number]  PCP:  Roselind Congo, MD  Cardiologist:  Ralene Burger, MD    Referring MD: Roselind Congo, MD   Chief Complaint  Patient presents with   Follow-up    History of Present Illness:    Todd Buck is a 68 y.o. male past medical history significant for coronary Bypass graft done in March 2019.  LIMA to LAD SVG to ramus SVG diagonal SVG to RCA.  Ejection fraction normal.  Additional problem include dyslipidemia.  He comes today to my office for follow-up.  He is doing great, symptomatic.  Denies have any chest pain tightness squeezing pressure in chest still playing will develop a history of no difficulty doing it.  On top of that he goes for a walk many times a week and have no problem doing it  Past Medical History:  Diagnosis Date   CAD (coronary artery disease), native coronary artery 02/16/2018   Cath 02/16/18    99% prox RCA, PDA 100% with collaterals, Ramus 60 and 80% prox, Prox LAD 95%, mid 70%, mid 100% with collaterals, ostial diagonal 90%, ostial circ 99%  CABG 02/18/18 Dr. Nicanor Barge with LIMA to LAD, SVG to Ramus, SVG to diag, SVG to RCA   Chronic kidney disease with active medical management without dialysis, stage 3 (moderate) (HCC) 02/15/2018   Coronary artery disease    Gastroesophageal reflux disease    GERD (gastroesophageal reflux disease)    History of kidney stones    Hyperlipidemia 02/15/2018   Kidney disease, chronic, stage III (GFR 30-59 ml/min) (HCC) 02/15/2018   Kidney stones    Obesity (BMI 30-39.9) 02/15/2018   Pure hypercholesterolemia 12/19/2020   S/P CABG x 4 02/17/2018    Past Surgical History:  Procedure Laterality Date   COLONOSCOPY  2007   CORONARY ARTERY BYPASS GRAFT N/A 02/17/2018   Procedure: CORONARY ARTERY BYPASS GRAFTING (CABG) x 4 WITH ENDOSCOPIC HARVESTING OF RIGHT SAPHENOUS VEIN. LIMA TO LAD, SVG TO DISTAL RCA, SVG TO DIAGONAL, SVG TO  INTERMEDIUS;  Surgeon: Norita Beauvais, MD;  Location: [facility] OR;  Service: Open Heart Surgery;  Laterality: N/A;   dental implants     EXTRACORPOREAL SHOCK WAVE LITHOTRIPSY Right 05/30/2019   Procedure: EXTRACORPOREAL SHOCK WAVE LITHOTRIPSY (ESWL);  Surgeon: Andrez Banker, MD;  Location: [facility] ORS;  Service: Urology;  Laterality: Right;   LASIK     EYES   LEFT HEART CATH AND CORONARY ANGIOGRAPHY N/A 02/16/2018   Procedure: LEFT HEART CATH AND CORONARY ANGIOGRAPHY;  Surgeon: Arleen Lacer, MD;  Location: [facility] INVASIVE CV LAB;  Service: Cardiovascular;  Laterality: N/A;   LEFT HEART CATH AND CORONARY ANGIOGRAPHY N/A 04/16/2023   Procedure: LEFT HEART CATH AND CORONARY ANGIOGRAPHY;  Surgeon: Odie Benne, MD;  Location: [facility] INVASIVE CV LAB;  Service: Cardiovascular;  Laterality: N/A;   LITHOTRIPSY     POLYPECTOMY     TEE WITHOUT CARDIOVERSION N/A 02/17/2018   Procedure: TRANSESOPHAGEAL ECHOCARDIOGRAM (TEE);  Surgeon: Norita Beauvais, MD;  Location: [facility] OR;  Service: Open Heart Surgery;  Laterality: N/A;    Current Medications: Current Meds  Medication Sig   aspirin  EC 81 MG tablet Take 81 mg by mouth at bedtime. Swallow whole.   ezetimibe  (ZETIA ) 10 MG tablet TAKE 1 TABLET(10 MG) BY MOUTH DAILY (Patient taking differently: Take 10 mg by mouth daily.)   metoprolol  succinate (TOPROL -XL) 50 MG  24 hr tablet Take 1 tablet (50 mg total) by mouth at bedtime.   nitroGLYCERIN  (NITROSTAT ) 0.4 MG SL tablet Place 1 tablet (0.4 mg total) under the tongue every 5 (five) minutes x 3 doses as needed for chest pain.   rosuvastatin  (CRESTOR ) 40 MG tablet Take 1 tablet (40 mg total) by mouth daily.     Allergies:   Patient has no known allergies.   Social History   Socioeconomic History   Marital status: Married    Spouse name: Not on file   Number of children: Not on file   Years of education: Not on file   Highest education level: Not on file  Occupational History   Not on file  Tobacco  Use   Smoking status: Former    Passive exposure: Never   Smokeless tobacco: Never   Tobacco comments:    40 years ago  Vaping Use   Vaping status: Never Used  Substance and Sexual Activity   Alcohol use: No   Drug use: No   Sexual activity: Yes  Other Topics Concern   Not on file  Social History Narrative   Not on file   Social Drivers of Health   Financial Resource Strain: Not on file  Food Insecurity: No Food Insecurity (04/16/2023)   Hunger Vital Sign    Worried About Running Out of Food in the Last Year: Never true    Ran Out of Food in the Last Year: Never true  Transportation Needs: No Transportation Needs (04/16/2023)   PRAPARE - Administrator, Civil Service (Medical): No    Lack of Transportation (Non-Medical): No  Physical Activity: Not on file  Stress: Not on file  Social Connections: Not on file     Family History: The patient's family history includes Heart disease in his father and mother; Hyperlipidemia in his sister and sister; Renal Disease in his sister. There is no history of Colon cancer, Esophageal cancer, Pancreatic cancer, Rectal cancer, or Stomach cancer. ROS:   Please see the history of present illness.    All 14 point review of systems negative except as described per history of present illness  EKGs/Labs/Other Studies Reviewed:    EKG Interpretation Date/Time:  Wednesday April 06 2024 08:28:39 EDT Ventricular Rate:  62 PR Interval:  200 QRS Duration:  142 QT Interval:  436 QTC Calculation: 442 R Axis:   -48  Text Interpretation: Normal sinus rhythm Left axis deviation Left bundle branch block When compared with ECG of 17-Apr-2023 08:06, QRS axis Shifted left Nonspecific T wave abnormality has replaced inverted T waves in Inferior leads Confirmed by Ralene Burger [phone]) on 04/06/2024 8:44:50 AM    Recent Labs: 04/15/2023: B Natriuretic Peptide 11.3 04/16/2023: ALT 30; Hemoglobin 14.5; Platelets 159 04/17/2023: BUN 16; Creatinine,  Ser 1.47; Potassium 4.0; Sodium 139  Recent Lipid Panel    Component Value Date/Time   CHOL 120 04/16/2023 0927   CHOL 108 06/22/2020 0833   TRIG 82 04/16/2023 0927   HDL 42 04/16/2023 0927   HDL 40 06/22/2020 0833   CHOLHDL 2.9 04/16/2023 0927   VLDL 16 04/16/2023 0927   LDLCALC 62 04/16/2023 0927   LDLCALC 50 06/22/2020 0833    Physical Exam:    VS:  BP 136/84 (BP Location: Right Arm, Patient Position: Sitting)   Pulse 62   Ht 5\' 8"  (1.727 m)   Wt 213 lb (96.6 kg)   SpO2 97%   BMI 32.39 kg/m  Wt Readings from Last 3 Encounters:  04/06/24 213 lb (96.6 kg)  07/28/23 209 lb (94.8 kg)  05/01/23 203 lb 3.2 oz (92.2 kg)     GEN:  Well nourished, well developed in no acute distress HEENT: Normal NECK: No JVD; No carotid bruits LYMPHATICS: No lymphadenopathy CARDIAC: RRR, no murmurs, no rubs, no gallops RESPIRATORY:  Clear to auscultation without rales, wheezing or rhonchi  ABDOMEN: Soft, non-tender, non-distended MUSCULOSKELETAL:  No edema; No deformity  SKIN: Warm and dry LOWER EXTREMITIES: no swelling NEUROLOGIC:  Alert and oriented x 3 PSYCHIATRIC:  Normal affect   ASSESSMENT:    1. Palpitations   2. Coronary artery disease involving native coronary artery of native heart without angina pectoris   3. S/P CABG x 4   4. Gastroesophageal reflux disease, unspecified whether esophagitis present    PLAN:    In order of problems listed above:  Palpitations denies having any. Coronary disease stable today continue no signs or symptoms of problem at the same time he is very aggressive with exercises and have no issues. Gastroesophageal reflux disease.  Stable. Dyslipidemia continue K PN from September 02, 2023 LDL 31 HDL 42 excellent control of his Crestor  40 which I will continue. : Diabetes, his hemoglobin A1c from May of last year was 6.0.  Since that time he aggressively exercise hopefully he is hemoglobin A1c is better.   Medication Adjustments/Labs and  Tests Ordered: Current medicines are reviewed at length with the patient today.  Concerns regarding medicines are outlined above.  Orders Placed This Encounter  Procedures   EKG 12-Lead   Medication changes: No orders of the defined types were placed in this encounter.   Signed, Manfred Seed, MD, [facility] 04/06/2024 8:58 AM    [facility]

## 2024-04-19 DIAGNOSIS — K080 Exfoliation of teeth due to systemic causes: Secondary | ICD-10-CM | POA: Diagnosis not present

## 2024-04-21 ENCOUNTER — Telehealth: Payer: Self-pay | Admitting: Cardiology

## 2024-04-21 MED ORDER — ROSUVASTATIN CALCIUM 40 MG PO TABS: 40.0000 mg | ORAL_TABLET | Freq: Every day | ORAL | 3 refills | Status: AC

## 2024-04-21 NOTE — Telephone Encounter (Signed)
*  STAT* If patient is at the pharmacy, call can be transferred to refill team.   1. Which medications need to be refilled? (please list name of each medication and dose if known)   rosuvastatin  (CRESTOR ) 40 MG tablet   2. Would you like to learn more about the convenience, safety, & potential cost savings by using the [facility]?   3. Are you open to using the [facility] (Type [facility]. ).  4. Which pharmacy/location (including street and city if local pharmacy) is medication to be sent to?  [id number] - [place], [place] - [address]   5. Do they need a 30 day or 90 day supply?  90 day  Wife [name]) stated patient is completely out of this medication.

## 2024-04-21 NOTE — Telephone Encounter (Signed)
 Pt's medication was sent to pt's pharmacy as requested. Confirmation received.

## 2024-09-14 DIAGNOSIS — E7800 Pure hypercholesterolemia, unspecified: Secondary | ICD-10-CM | POA: Diagnosis not present

## 2024-09-14 DIAGNOSIS — Z23 Encounter for immunization: Secondary | ICD-10-CM | POA: Diagnosis not present

## 2024-09-14 DIAGNOSIS — Z125 Encounter for screening for malignant neoplasm of prostate: Secondary | ICD-10-CM | POA: Diagnosis not present

## 2024-09-14 DIAGNOSIS — R7303 Prediabetes: Secondary | ICD-10-CM | POA: Diagnosis not present

## 2024-09-14 DIAGNOSIS — N183 Chronic kidney disease, stage 3 unspecified: Secondary | ICD-10-CM | POA: Diagnosis not present

## 2024-09-14 DIAGNOSIS — Z0000 Encounter for general adult medical examination without abnormal findings: Secondary | ICD-10-CM | POA: Diagnosis not present

## 2024-09-14 DIAGNOSIS — M722 Plantar fascial fibromatosis: Secondary | ICD-10-CM | POA: Diagnosis not present

## 2024-09-29 ENCOUNTER — Other Ambulatory Visit: Payer: Self-pay | Admitting: Cardiology
